# Patient Record
Sex: Male | Born: 1950 | Race: White | Hispanic: No | Marital: Married | Smoking: Current every day smoker
Health system: Southern US, Community
[De-identification: ages and names within clinical notes are randomized; demographics above are authoritative.]

## PROBLEM LIST (undated history)

## (undated) DIAGNOSIS — F32A Depression, unspecified: Secondary | ICD-10-CM

## (undated) DIAGNOSIS — E785 Hyperlipidemia, unspecified: Secondary | ICD-10-CM

## (undated) DIAGNOSIS — M5412 Radiculopathy, cervical region: Secondary | ICD-10-CM

## (undated) DIAGNOSIS — F329 Major depressive disorder, single episode, unspecified: Secondary | ICD-10-CM

## (undated) DIAGNOSIS — M545 Low back pain, unspecified: Secondary | ICD-10-CM

## (undated) DIAGNOSIS — I10 Essential (primary) hypertension: Secondary | ICD-10-CM

## (undated) DIAGNOSIS — E119 Type 2 diabetes mellitus without complications: Secondary | ICD-10-CM

## (undated) DIAGNOSIS — M1990 Unspecified osteoarthritis, unspecified site: Secondary | ICD-10-CM

## (undated) DIAGNOSIS — E538 Deficiency of other specified B group vitamins: Secondary | ICD-10-CM

## (undated) DIAGNOSIS — K219 Gastro-esophageal reflux disease without esophagitis: Secondary | ICD-10-CM

## (undated) HISTORY — PX: OTHER SURGICAL HISTORY: SHX169

## (undated) HISTORY — PX: BACK SURGERY: SHX140

## (undated) HISTORY — PX: FRACTURE SURGERY: SHX138

## (undated) HISTORY — PX: SHOULDER SURGERY: SHX246

---

## 1998-06-28 ENCOUNTER — Other Ambulatory Visit: Admission: RE | Admit: 1998-06-28 | Discharge: 1998-06-28 | Payer: Self-pay | Admitting: Family Medicine

## 2002-02-19 ENCOUNTER — Encounter: Admission: RE | Admit: 2002-02-19 | Discharge: 2002-02-19 | Payer: Self-pay

## 2003-03-28 ENCOUNTER — Ambulatory Visit (HOSPITAL_COMMUNITY): Admission: RE | Admit: 2003-03-28 | Discharge: 2003-03-28 | Payer: Self-pay | Admitting: Gastroenterology

## 2014-10-05 ENCOUNTER — Other Ambulatory Visit: Payer: Self-pay | Admitting: Neurosurgery

## 2014-10-14 NOTE — Pre-Procedure Instructions (Signed)
ERVING HON  10/14/2014   Your procedure is scheduled on:  Thursday, October 27, 2014 at 7:30 AM.   Report to [facility] Entrance "A" Admitting Office at 5:30 AM.   Call this number if you have problems the morning of surgery: [phone]   Remember:   Do not eat food or drink liquids after midnight Monday, 10/24/14.   Take these medicines the morning of surgery with A SIP OF WATER: amlodipine(Norvasc),fenofibrate,pain medication if needed,magnesium,protonix if needed   Do not wear jewelry.  Do not wear lotions, powders, or cologne. You may not wear deodorant.  Men may shave face and neck.  Do not bring valuables to the hospital.  [facility] is not responsible for any belongings or valuables.               Contacts, dentures or bridgework may not be worn into surgery .  Leave suitcase in the car. After surgery it may be brought to your room.  For patients admitted to the hospital, discharge time is determined by your treatment team.               Special Instructions: [place] - Preparing for Surgery  Before surgery, you can play an important role.  Because skin is not sterile, your skin needs to be as free of germs as possible.  You can reduce the number of germs on you skin by washing with CHG (chlorahexidine gluconate) soap before surgery.  CHG is an antiseptic cleaner which kills germs and bonds with the skin to continue killing germs even after washing.  Please DO NOT use if you have an allergy to CHG or antibacterial soaps.  If your skin becomes reddened/irritated stop using the CHG and inform your nurse when you arrive at Short Stay.  Do not shave (including legs and underarms) for at least 48 hours prior to the first CHG shower.  You may shave your face.  Please follow these instructions carefully:   1.  Shower with CHG Soap the night before surgery and the   morning of Surgery.  2.  If you choose to wash your hair, wash your hair first as usual with your   normal shampoo.  3.  After you shampoo, rinse your hair and body thoroughly to remove the   Shampoo.  4.  Use CHG as you would any other liquid soap.  You can apply chg directly  to the skin and wash gently with scrungie or a clean washcloth.  5.  Apply the CHG Soap to your body ONLY FROM THE NECK DOWN.        Do not use on open wounds or open sores.  Avoid contact with your eyes, ears, mouth and genitals (private parts).  Wash genitals (private parts) with your normal soap.  6.  Wash thoroughly, paying special attention to the area where your surgery  will be performed.  7.  Thoroughly rinse your body with warm water from the neck down.  8.  DO NOT shower/wash with your normal soap after using and rinsing off  the CHG Soap.  9.  Pat yourself dry with a clean towel.            10.  Wear clean pajamas.            11.  Place clean sheets on your bed the night of your first shower and do not  sleep with pets.  Day of Surgery  Do not apply any lotions  the morning of surgery.  Please wear clean clothes to the hospital.     Please read over the following fact sheets that you were given: Pain Booklet, Coughing and Deep Breathing, Blood Transfusion Information, MRSA Information and Surgical Site Infection Prevention

## 2014-10-17 ENCOUNTER — Encounter (HOSPITAL_COMMUNITY)
Admission: RE | Admit: 2014-10-17 | Discharge: 2014-10-17 | Disposition: A | Discharge status: Home / Self Care | Payer: Medicare Other | Source: Ambulatory Visit | Attending: Anesthesiology | Admitting: Anesthesiology

## 2014-10-17 ENCOUNTER — Encounter (HOSPITAL_COMMUNITY): Payer: Self-pay

## 2014-10-17 ENCOUNTER — Encounter (HOSPITAL_COMMUNITY)
Admission: RE | Admit: 2014-10-17 | Discharge: 2014-10-17 | Disposition: A | Discharge status: Home / Self Care | Payer: Medicare Other | Source: Ambulatory Visit | Attending: Neurosurgery | Admitting: Neurosurgery

## 2014-10-17 DIAGNOSIS — Z01818 Encounter for other preprocedural examination: Secondary | ICD-10-CM | POA: Insufficient documentation

## 2014-10-17 DIAGNOSIS — E785 Hyperlipidemia, unspecified: Secondary | ICD-10-CM | POA: Diagnosis not present

## 2014-10-17 DIAGNOSIS — I10 Essential (primary) hypertension: Secondary | ICD-10-CM | POA: Diagnosis not present

## 2014-10-17 DIAGNOSIS — M4806 Spinal stenosis, lumbar region: Secondary | ICD-10-CM | POA: Insufficient documentation

## 2014-10-17 DIAGNOSIS — E119 Type 2 diabetes mellitus without complications: Secondary | ICD-10-CM | POA: Insufficient documentation

## 2014-10-17 DIAGNOSIS — F329 Major depressive disorder, single episode, unspecified: Secondary | ICD-10-CM | POA: Insufficient documentation

## 2014-10-17 DIAGNOSIS — E538 Deficiency of other specified B group vitamins: Secondary | ICD-10-CM | POA: Diagnosis not present

## 2014-10-17 DIAGNOSIS — K219 Gastro-esophageal reflux disease without esophagitis: Secondary | ICD-10-CM | POA: Insufficient documentation

## 2014-10-17 DIAGNOSIS — Z01811 Encounter for preprocedural respiratory examination: Secondary | ICD-10-CM

## 2014-10-17 HISTORY — DX: Major depressive disorder, single episode, unspecified: F32.9

## 2014-10-17 HISTORY — DX: Depression, unspecified: F32.A

## 2014-10-17 HISTORY — DX: Unspecified osteoarthritis, unspecified site: M19.90

## 2014-10-17 HISTORY — DX: Low back pain: M54.5

## 2014-10-17 HISTORY — DX: Deficiency of other specified B group vitamins: E53.8

## 2014-10-17 HISTORY — DX: Low back pain, unspecified: M54.50

## 2014-10-17 HISTORY — DX: Hyperlipidemia, unspecified: E78.5

## 2014-10-17 HISTORY — DX: Type 2 diabetes mellitus without complications: E11.9

## 2014-10-17 HISTORY — DX: Hypomagnesemia: E83.42

## 2014-10-17 HISTORY — DX: Essential (primary) hypertension: I10

## 2014-10-17 HISTORY — DX: Gastro-esophageal reflux disease without esophagitis: K21.9

## 2014-10-17 HISTORY — DX: Radiculopathy, cervical region: M54.12

## 2014-10-17 LAB — BASIC METABOLIC PANEL
Anion gap: 14 (ref 5–15)
BUN: 14 mg/dL (ref 6–23)
CALCIUM: 10.5 mg/dL (ref 8.4–10.5)
CO2: 25 meq/L (ref 19–32)
CREATININE: 1.12 mg/dL (ref 0.50–1.35)
Chloride: 100 mEq/L (ref 96–112)
GFR calc Af Amer: 79 mL/min — ABNORMAL LOW (ref >90)
GFR, EST NON AFRICAN AMERICAN: 68 mL/min — AB (ref >90)
GLUCOSE: 107 mg/dL — AB (ref 70–99)
Potassium: 4.5 mEq/L (ref 3.7–5.3)
Sodium: 139 mEq/L (ref 137–147)

## 2014-10-17 LAB — SURGICAL PCR SCREEN
MRSA, PCR: NEGATIVE
Staphylococcus aureus: NEGATIVE

## 2014-10-17 LAB — CBC
HEMATOCRIT: 39 % (ref 39.0–52.0)
Hemoglobin: 12.1 g/dL — ABNORMAL LOW (ref 13.0–17.0)
MCH: 26.6 pg (ref 26.0–34.0)
MCHC: 31 g/dL (ref 30.0–36.0)
MCV: 85.7 fL (ref 78.0–100.0)
PLATELETS: 269 10*3/uL (ref 150–400)
RBC: 4.55 MIL/uL (ref 4.22–5.81)
RDW: 16.6 % — AB (ref 11.5–15.5)
WBC: 6.1 10*3/uL (ref 4.0–10.5)

## 2014-10-17 LAB — TYPE AND SCREEN
ABO/RH(D): O NEG
Antibody Screen: NEGATIVE

## 2014-10-17 LAB — ABO/RH: ABO/RH(D): O NEG

## 2014-10-17 NOTE — Progress Notes (Signed)
This patient tested at an elevated risk for OSA during a pre-surgical visit using the SROP BANG TOOL.A score of 4 or greater is an elevated risk.

## 2014-10-17 NOTE — Progress Notes (Signed)
Anesthesia Chart Review:  Pt is 63 year old male scheduled for extension of fusion L3-4, L4-5, removal of old hardware on 10/27/14 with Dr. Hal Neer.   PMH: HTN, DM, hyperlipidemia, B12 deficiency, GERD, depression  Medications include: ASA, amlodipine, hctz, lisinopril, glimepiride, pioglitazone-metformin, lipitor  Preoperative labs reviewed.    Chest x-ray reviewed. 1. Borderline cardiomegaly without failure.  2. No acute cardiopulmonary disease.   EKG:  NSR with sinus arrhythmia. Cannot rule out anterior infarct, age undetermined. Contacted PCP office, no previous EKG available.   Reviewed with Dr. Therisa Doyne.   If no changes, I anticipate pt can proceed with surgery as scheduled.   Willeen Cass, FNP-BC [facility] Short Stay Surgical [facility]/Anesthesiology Phone: [phone] 10/17/2014 3:47 PM

## 2014-10-26 MED ORDER — CEFAZOLIN SODIUM-DEXTROSE 2-3 GM-% IV SOLR
2.0000 g | INTRAVENOUS | Status: AC
  Administered 2014-10-27 (×2): 2 g via INTRAVENOUS
  Filled 2014-10-26: qty 50

## 2014-10-26 MED ORDER — DEXAMETHASONE SODIUM PHOSPHATE 10 MG/ML IJ SOLN
10.0000 mg | INTRAMUSCULAR | Status: AC
  Administered 2014-10-27: 10 mg via INTRAVENOUS
  Filled 2014-10-26: qty 1

## 2014-10-27 ENCOUNTER — Encounter (HOSPITAL_COMMUNITY)
Admission: RE | Disposition: A | Discharge status: Home / Self Care | Payer: Self-pay | Source: Ambulatory Visit | Attending: Neurosurgery

## 2014-10-27 ENCOUNTER — Encounter (HOSPITAL_COMMUNITY): Payer: Self-pay | Admitting: *Deleted

## 2014-10-27 ENCOUNTER — Inpatient Hospital Stay (HOSPITAL_COMMUNITY)
Admission: RE | Admit: 2014-10-27 | Discharge: 2014-10-29 | DRG: 460 | Disposition: A | Discharge status: Home / Self Care | Payer: Medicare Other | Source: Ambulatory Visit | Attending: Neurosurgery | Admitting: Neurosurgery

## 2014-10-27 ENCOUNTER — Encounter (HOSPITAL_COMMUNITY): Payer: Medicare Other | Admitting: Emergency Medicine

## 2014-10-27 ENCOUNTER — Inpatient Hospital Stay (HOSPITAL_COMMUNITY): Payer: Medicare Other | Admitting: Certified Registered Nurse Anesthetist

## 2014-10-27 ENCOUNTER — Inpatient Hospital Stay (HOSPITAL_COMMUNITY): Payer: Medicare Other

## 2014-10-27 DIAGNOSIS — K219 Gastro-esophageal reflux disease without esophagitis: Secondary | ICD-10-CM | POA: Diagnosis present

## 2014-10-27 DIAGNOSIS — Z6837 Body mass index (BMI) 37.0-37.9, adult: Secondary | ICD-10-CM | POA: Diagnosis not present

## 2014-10-27 DIAGNOSIS — F329 Major depressive disorder, single episode, unspecified: Secondary | ICD-10-CM | POA: Diagnosis present

## 2014-10-27 DIAGNOSIS — M4326 Fusion of spine, lumbar region: Secondary | ICD-10-CM

## 2014-10-27 DIAGNOSIS — I10 Essential (primary) hypertension: Secondary | ICD-10-CM | POA: Diagnosis present

## 2014-10-27 DIAGNOSIS — E669 Obesity, unspecified: Secondary | ICD-10-CM | POA: Diagnosis present

## 2014-10-27 DIAGNOSIS — M7130 Other bursal cyst, unspecified site: Secondary | ICD-10-CM | POA: Diagnosis present

## 2014-10-27 DIAGNOSIS — E119 Type 2 diabetes mellitus without complications: Secondary | ICD-10-CM | POA: Diagnosis present

## 2014-10-27 DIAGNOSIS — F17210 Nicotine dependence, cigarettes, uncomplicated: Secondary | ICD-10-CM | POA: Diagnosis present

## 2014-10-27 DIAGNOSIS — M4806 Spinal stenosis, lumbar region: Principal | ICD-10-CM | POA: Diagnosis present

## 2014-10-27 DIAGNOSIS — M549 Dorsalgia, unspecified: Secondary | ICD-10-CM | POA: Diagnosis present

## 2014-10-27 DIAGNOSIS — M48061 Spinal stenosis, lumbar region without neurogenic claudication: Secondary | ICD-10-CM | POA: Diagnosis present

## 2014-10-27 LAB — GLUCOSE, CAPILLARY
GLUCOSE-CAPILLARY: 254 mg/dL — AB (ref 70–99)
Glucose-Capillary: 104 mg/dL — ABNORMAL HIGH (ref 70–99)

## 2014-10-27 SURGERY — POSTERIOR LUMBAR FUSION 2 LEVEL
Anesthesia: General | Site: Back

## 2014-10-27 MED ORDER — GLYCOPYRROLATE 0.2 MG/ML IJ SOLN
INTRAMUSCULAR | Status: DC | PRN
  Administered 2014-10-27: .8 mg via INTRAVENOUS

## 2014-10-27 MED ORDER — LACTATED RINGERS IV SOLN
INTRAVENOUS | Status: DC | PRN
  Administered 2014-10-27 (×3): via INTRAVENOUS

## 2014-10-27 MED ORDER — CYCLOBENZAPRINE HCL 10 MG PO TABS
10.0000 mg | ORAL_TABLET | Freq: Three times a day (TID) | ORAL | Status: DC | PRN
  Administered 2014-10-27 – 2014-10-28 (×4): 10 mg via ORAL
  Filled 2014-10-27 (×3): qty 1

## 2014-10-27 MED ORDER — CYCLOBENZAPRINE HCL 10 MG PO TABS
ORAL_TABLET | ORAL | Status: AC
  Filled 2014-10-27: qty 1

## 2014-10-27 MED ORDER — ARTIFICIAL TEARS OP OINT
TOPICAL_OINTMENT | OPHTHALMIC | Status: DC | PRN
  Administered 2014-10-27: 1 via OPHTHALMIC

## 2014-10-27 MED ORDER — INSULIN ASPART 100 UNIT/ML ~~LOC~~ SOLN
0.0000 [IU] | Freq: Every day | SUBCUTANEOUS | Status: DC
  Administered 2014-10-27: 3 [IU] via SUBCUTANEOUS

## 2014-10-27 MED ORDER — OXYCODONE HCL 5 MG PO TABS
ORAL_TABLET | ORAL | Status: AC
  Filled 2014-10-27: qty 1

## 2014-10-27 MED ORDER — ALBUMIN HUMAN 5 % IV SOLN
INTRAVENOUS | Status: DC | PRN
  Administered 2014-10-27 (×4): via INTRAVENOUS

## 2014-10-27 MED ORDER — NEOSTIGMINE METHYLSULFATE 10 MG/10ML IV SOLN
INTRAVENOUS | Status: AC
  Filled 2014-10-27: qty 1

## 2014-10-27 MED ORDER — ROCURONIUM BROMIDE 50 MG/5ML IV SOLN
INTRAVENOUS | Status: AC
  Filled 2014-10-27: qty 1

## 2014-10-27 MED ORDER — GLIMEPIRIDE 4 MG PO TABS
4.0000 mg | ORAL_TABLET | Freq: Every day | ORAL | Status: DC
  Administered 2014-10-28 – 2014-10-29 (×2): 4 mg via ORAL
  Filled 2014-10-27 (×2): qty 1

## 2014-10-27 MED ORDER — FENTANYL CITRATE 0.05 MG/ML IJ SOLN
25.0000 ug | INTRAMUSCULAR | Status: DC | PRN
  Administered 2014-10-27: 50 ug via INTRAVENOUS
  Administered 2014-10-27 (×2): 25 ug via INTRAVENOUS
  Administered 2014-10-27: 50 ug via INTRAVENOUS

## 2014-10-27 MED ORDER — ONDANSETRON HCL 4 MG/2ML IJ SOLN
INTRAMUSCULAR | Status: DC | PRN
  Administered 2014-10-27: 4 mg via INTRAVENOUS

## 2014-10-27 MED ORDER — PIOGLITAZONE HCL 15 MG PO TABS
15.0000 mg | ORAL_TABLET | Freq: Two times a day (BID) | ORAL | Status: DC
  Administered 2014-10-28 – 2014-10-29 (×2): 15 mg via ORAL
  Filled 2014-10-27 (×5): qty 1

## 2014-10-27 MED ORDER — PROPOFOL 10 MG/ML IV BOLUS
INTRAVENOUS | Status: DC | PRN
  Administered 2014-10-27: 170 mg via INTRAVENOUS

## 2014-10-27 MED ORDER — BUPIVACAINE LIPOSOME 1.3 % IJ SUSP
INTRAMUSCULAR | Status: DC | PRN
  Administered 2014-10-27: 20 mL

## 2014-10-27 MED ORDER — PIOGLITAZONE HCL-METFORMIN HCL 15-850 MG PO TABS: 1.0000 | ORAL_TABLET | Freq: Two times a day (BID) | ORAL | Status: DC

## 2014-10-27 MED ORDER — INSULIN ASPART 100 UNIT/ML ~~LOC~~ SOLN
6.0000 [IU] | Freq: Three times a day (TID) | SUBCUTANEOUS | Status: DC
  Administered 2014-10-27 – 2014-10-29 (×5): 6 [IU] via SUBCUTANEOUS

## 2014-10-27 MED ORDER — VECURONIUM BROMIDE 10 MG IV SOLR
INTRAVENOUS | Status: AC
  Filled 2014-10-27: qty 10

## 2014-10-27 MED ORDER — CEFAZOLIN SODIUM-DEXTROSE 2-3 GM-% IV SOLR
2.0000 g | Freq: Three times a day (TID) | INTRAVENOUS | Status: AC
  Administered 2014-10-27 – 2014-10-28 (×2): 2 g via INTRAVENOUS
  Filled 2014-10-27 (×2): qty 50

## 2014-10-27 MED ORDER — GLYCOPYRROLATE 0.2 MG/ML IJ SOLN
INTRAMUSCULAR | Status: AC
  Filled 2014-10-27: qty 4

## 2014-10-27 MED ORDER — NEOSTIGMINE METHYLSULFATE 10 MG/10ML IV SOLN
INTRAVENOUS | Status: DC | PRN
  Administered 2014-10-27: 5 mg via INTRAVENOUS

## 2014-10-27 MED ORDER — ONDANSETRON HCL 4 MG/2ML IJ SOLN: 4.0000 mg | Freq: Once | INTRAMUSCULAR | Status: DC | PRN

## 2014-10-27 MED ORDER — MIDAZOLAM HCL 5 MG/5ML IJ SOLN
INTRAMUSCULAR | Status: DC | PRN
  Administered 2014-10-27: 2 mg via INTRAVENOUS

## 2014-10-27 MED ORDER — FENTANYL CITRATE 0.05 MG/ML IJ SOLN
INTRAMUSCULAR | Status: DC | PRN
  Administered 2014-10-27: 50 ug via INTRAVENOUS
  Administered 2014-10-27: 100 ug via INTRAVENOUS
  Administered 2014-10-27 (×2): 50 ug via INTRAVENOUS
  Administered 2014-10-27: 100 ug via INTRAVENOUS
  Administered 2014-10-27 (×3): 50 ug via INTRAVENOUS

## 2014-10-27 MED ORDER — LIDOCAINE HCL (CARDIAC) 20 MG/ML IV SOLN
INTRAVENOUS | Status: AC
  Filled 2014-10-27: qty 5

## 2014-10-27 MED ORDER — OXYCODONE-ACETAMINOPHEN 5-325 MG PO TABS
1.0000 | ORAL_TABLET | ORAL | Status: DC | PRN
  Administered 2014-10-27 – 2014-10-28 (×2): 2 via ORAL
  Filled 2014-10-27 (×3): qty 2

## 2014-10-27 MED ORDER — FENTANYL CITRATE 0.05 MG/ML IJ SOLN
INTRAMUSCULAR | Status: AC
  Filled 2014-10-27: qty 5

## 2014-10-27 MED ORDER — ALUM & MAG HYDROXIDE-SIMETH 200-200-20 MG/5ML PO SUSP: 30.0000 mL | Freq: Four times a day (QID) | ORAL | Status: DC | PRN

## 2014-10-27 MED ORDER — MENTHOL 3 MG MT LOZG: 1.0000 | LOZENGE | OROMUCOSAL | Status: DC | PRN

## 2014-10-27 MED ORDER — BACITRACIN 50000 UNITS IM SOLR
INTRAMUSCULAR | Status: DC | PRN
  Administered 2014-10-27 (×2)

## 2014-10-27 MED ORDER — PHENYLEPHRINE HCL 10 MG/ML IJ SOLN
10.0000 mg | INTRAVENOUS | Status: DC | PRN
  Administered 2014-10-27: 20 ug/min via INTRAVENOUS

## 2014-10-27 MED ORDER — BUPIVACAINE LIPOSOME 1.3 % IJ SUSP
20.0000 mL | INTRAMUSCULAR | Status: AC
  Filled 2014-10-27: qty 20

## 2014-10-27 MED ORDER — HYDROCHLOROTHIAZIDE 25 MG PO TABS
12.5000 mg | ORAL_TABLET | Freq: Every day | ORAL | Status: DC
  Administered 2014-10-28 – 2014-10-29 (×2): 12.5 mg via ORAL
  Filled 2014-10-27 (×2): qty 1

## 2014-10-27 MED ORDER — SODIUM CHLORIDE 0.9 % IJ SOLN: 3.0000 mL | INTRAMUSCULAR | Status: DC | PRN

## 2014-10-27 MED ORDER — ACETAMINOPHEN 650 MG RE SUPP: 650.0000 mg | RECTAL | Status: DC | PRN

## 2014-10-27 MED ORDER — PHENYLEPHRINE HCL 10 MG/ML IJ SOLN
INTRAMUSCULAR | Status: DC | PRN
  Administered 2014-10-27 (×5): 80 ug via INTRAVENOUS

## 2014-10-27 MED ORDER — LISINOPRIL 20 MG PO TABS
40.0000 mg | ORAL_TABLET | Freq: Every day | ORAL | Status: DC
  Administered 2014-10-28 – 2014-10-29 (×2): 40 mg via ORAL
  Filled 2014-10-27 (×2): qty 2

## 2014-10-27 MED ORDER — ONDANSETRON HCL 4 MG/2ML IJ SOLN
INTRAMUSCULAR | Status: AC
  Filled 2014-10-27: qty 2

## 2014-10-27 MED ORDER — ALBUTEROL SULFATE HFA 108 (90 BASE) MCG/ACT IN AERS
INHALATION_SPRAY | RESPIRATORY_TRACT | Status: DC | PRN
  Administered 2014-10-27: 4 via RESPIRATORY_TRACT

## 2014-10-27 MED ORDER — NEOSTIGMINE METHYLSULFATE 10 MG/10ML IV SOLN
INTRAVENOUS | Status: AC
  Filled 2014-10-27: qty 2

## 2014-10-27 MED ORDER — METFORMIN HCL 850 MG PO TABS
850.0000 mg | ORAL_TABLET | Freq: Two times a day (BID) | ORAL | Status: DC
  Administered 2014-10-28 – 2014-10-29 (×2): 850 mg via ORAL
  Filled 2014-10-27 (×5): qty 1

## 2014-10-27 MED ORDER — AMLODIPINE BESYLATE 5 MG PO TABS
5.0000 mg | ORAL_TABLET | Freq: Every day | ORAL | Status: DC
  Administered 2014-10-28 – 2014-10-29 (×2): 5 mg via ORAL
  Filled 2014-10-27 (×2): qty 1

## 2014-10-27 MED ORDER — ACETAMINOPHEN 325 MG PO TABS: 650.0000 mg | ORAL_TABLET | ORAL | Status: DC | PRN

## 2014-10-27 MED ORDER — ATORVASTATIN CALCIUM 10 MG PO TABS
20.0000 mg | ORAL_TABLET | Freq: Every day | ORAL | Status: DC
  Administered 2014-10-28: 20 mg via ORAL
  Filled 2014-10-27: qty 2

## 2014-10-27 MED ORDER — ARTIFICIAL TEARS OP OINT
TOPICAL_OINTMENT | OPHTHALMIC | Status: AC
  Filled 2014-10-27: qty 3.5

## 2014-10-27 MED ORDER — STERILE WATER FOR INJECTION IJ SOLN
INTRAMUSCULAR | Status: AC
  Filled 2014-10-27: qty 10

## 2014-10-27 MED ORDER — VANCOMYCIN HCL 1000 MG IV SOLR
INTRAVENOUS | Status: AC
  Filled 2014-10-27: qty 1000

## 2014-10-27 MED ORDER — PHENOL 1.4 % MT LIQD
1.0000 | OROMUCOSAL | Status: DC | PRN
  Administered 2014-10-27: 1 via OROMUCOSAL
  Filled 2014-10-27: qty 177

## 2014-10-27 MED ORDER — OXYCODONE HCL 5 MG PO TABS
5.0000 mg | ORAL_TABLET | Freq: Once | ORAL | Status: AC | PRN
  Administered 2014-10-27: 5 mg via ORAL

## 2014-10-27 MED ORDER — ALBUTEROL SULFATE HFA 108 (90 BASE) MCG/ACT IN AERS
INHALATION_SPRAY | RESPIRATORY_TRACT | Status: AC
  Filled 2014-10-27: qty 6.7

## 2014-10-27 MED ORDER — LIDOCAINE HCL (CARDIAC) 20 MG/ML IV SOLN
INTRAVENOUS | Status: DC | PRN
  Administered 2014-10-27: 50 mg via INTRAVENOUS

## 2014-10-27 MED ORDER — SODIUM CHLORIDE 0.9 % IR SOLN
Status: DC | PRN
  Administered 2014-10-27: 1000 mL

## 2014-10-27 MED ORDER — ONDANSETRON HCL 4 MG/2ML IJ SOLN: 4.0000 mg | INTRAMUSCULAR | Status: DC | PRN

## 2014-10-27 MED ORDER — INSULIN ASPART 100 UNIT/ML ~~LOC~~ SOLN
0.0000 [IU] | Freq: Three times a day (TID) | SUBCUTANEOUS | Status: DC
  Administered 2014-10-27: 4 [IU] via SUBCUTANEOUS
  Administered 2014-10-28: 3 [IU] via SUBCUTANEOUS
  Administered 2014-10-28: 4 [IU] via SUBCUTANEOUS
  Administered 2014-10-29: 3 [IU] via SUBCUTANEOUS

## 2014-10-27 MED ORDER — FENTANYL CITRATE 0.05 MG/ML IJ SOLN
INTRAMUSCULAR | Status: AC
  Filled 2014-10-27: qty 2

## 2014-10-27 MED ORDER — CEFAZOLIN SODIUM-DEXTROSE 2-3 GM-% IV SOLR
INTRAVENOUS | Status: AC
  Filled 2014-10-27: qty 50

## 2014-10-27 MED ORDER — SODIUM CHLORIDE 0.9 % IV SOLN: 250.0000 mL | INTRAVENOUS | Status: DC

## 2014-10-27 MED ORDER — VECURONIUM BROMIDE 10 MG IV SOLR
INTRAVENOUS | Status: DC | PRN
  Administered 2014-10-27 (×2): 1 mg via INTRAVENOUS
  Administered 2014-10-27: .5 mg via INTRAVENOUS
  Administered 2014-10-27: 1 mg via INTRAVENOUS
  Administered 2014-10-27: 2 mg via INTRAVENOUS
  Administered 2014-10-27: .5 mg via INTRAVENOUS
  Administered 2014-10-27 (×2): 1 mg via INTRAVENOUS
  Administered 2014-10-27: 2 mg via INTRAVENOUS
  Administered 2014-10-27 (×2): 1 mg via INTRAVENOUS

## 2014-10-27 MED ORDER — EPHEDRINE SULFATE 50 MG/ML IJ SOLN
INTRAMUSCULAR | Status: AC
  Filled 2014-10-27: qty 1

## 2014-10-27 MED ORDER — SUCCINYLCHOLINE CHLORIDE 20 MG/ML IJ SOLN
INTRAMUSCULAR | Status: AC
  Filled 2014-10-27: qty 1

## 2014-10-27 MED ORDER — HYDROMORPHONE HCL 1 MG/ML IJ SOLN
1.0000 mg | INTRAMUSCULAR | Status: DC | PRN
Start: 2014-10-27 — End: 2014-10-28
  Administered 2014-10-27 (×2): 1 mg via INTRAMUSCULAR
  Administered 2014-10-28: 1.5 mg via INTRAMUSCULAR
  Filled 2014-10-27 (×2): qty 1
  Filled 2014-10-27: qty 2

## 2014-10-27 MED ORDER — MIDAZOLAM HCL 2 MG/2ML IJ SOLN
INTRAMUSCULAR | Status: AC
  Filled 2014-10-27: qty 2

## 2014-10-27 MED ORDER — THROMBIN 20000 UNITS EX SOLR
CUTANEOUS | Status: DC | PRN
  Administered 2014-10-27 (×3): via TOPICAL

## 2014-10-27 MED ORDER — ROCURONIUM BROMIDE 100 MG/10ML IV SOLN
INTRAVENOUS | Status: DC | PRN
  Administered 2014-10-27: 50 mg via INTRAVENOUS

## 2014-10-27 MED ORDER — SODIUM CHLORIDE 0.9 % IJ SOLN
3.0000 mL | Freq: Two times a day (BID) | INTRAMUSCULAR | Status: DC
  Administered 2014-10-27 – 2014-10-29 (×3): 3 mL via INTRAVENOUS

## 2014-10-27 MED ORDER — POTASSIUM CHLORIDE IN NACL 20-0.45 MEQ/L-% IV SOLN
INTRAVENOUS | Status: DC
  Administered 2014-10-27: 17:00:00 via INTRAVENOUS
  Filled 2014-10-27 (×5): qty 1000

## 2014-10-27 MED ORDER — PANTOPRAZOLE SODIUM 40 MG IV SOLR
40.0000 mg | Freq: Every day | INTRAVENOUS | Status: DC
  Administered 2014-10-27: 40 mg via INTRAVENOUS
  Filled 2014-10-27: qty 40

## 2014-10-27 MED ORDER — PHENYLEPHRINE 40 MCG/ML (10ML) SYRINGE FOR IV PUSH (FOR BLOOD PRESSURE SUPPORT)
PREFILLED_SYRINGE | INTRAVENOUS | Status: AC
  Filled 2014-10-27: qty 10

## 2014-10-27 MED ORDER — PROPOFOL 10 MG/ML IV BOLUS
INTRAVENOUS | Status: AC
  Filled 2014-10-27: qty 20

## 2014-10-27 MED ORDER — OXYCODONE HCL 5 MG/5ML PO SOLN: 5.0000 mg | Freq: Once | ORAL | Status: AC | PRN

## 2014-10-27 MED ORDER — VANCOMYCIN HCL 1000 MG IV SOLR
INTRAVENOUS | Status: DC | PRN
  Administered 2014-10-27: 1000 mg

## 2014-10-27 SURGICAL SUPPLY — 69 items
APL SKNCLS STERI-STRIP NONHPOA (GAUZE/BANDAGES/DRESSINGS) ×2
BAG DECANTER FOR FLEXI CONT (MISCELLANEOUS) ×3 IMPLANT
BENZOIN TINCTURE PRP APPL 2/3 (GAUZE/BANDAGES/DRESSINGS) ×6 IMPLANT
BLADE CLIPPER SURG (BLADE) ×3 IMPLANT
BONE EQUIVA 10CC (Bone Implant) ×4 IMPLANT
BRUSH SCRUB EZ PLAIN DRY (MISCELLANEOUS) ×3 IMPLANT
BUR CUTTER 7.0 ROUND (BURR) ×7 IMPLANT
BUR MATCHSTICK NEURO 3.0 LAGG (BURR) ×3 IMPLANT
CAGE PEEK OPTIMA ARDIS 11X9X26 (Cage) ×4 IMPLANT
CANISTER SUCT 3000ML (MISCELLANEOUS) ×3 IMPLANT
CLOSURE WOUND 1/2 X4 (GAUZE/BANDAGES/DRESSINGS) ×2
CONT SPEC 4OZ CLIKSEAL STRL BL (MISCELLANEOUS) ×6 IMPLANT
COVER BACK TABLE 60X90IN (DRAPES) ×3 IMPLANT
DRAPE C-ARM 42X72 X-RAY (DRAPES) ×6 IMPLANT
DRAPE LAPAROTOMY 100X72X124 (DRAPES) ×3 IMPLANT
DRAPE SURG 17X23 STRL (DRAPES) ×6 IMPLANT
DRSG AQUACEL AG ADV 3.5X10 (GAUZE/BANDAGES/DRESSINGS) ×2 IMPLANT
DRSG OPSITE 4X5.5 SM (GAUZE/BANDAGES/DRESSINGS) ×2 IMPLANT
DRSG OPSITE POSTOP 4X6 (GAUZE/BANDAGES/DRESSINGS) ×3 IMPLANT
DRSG TELFA 3X8 NADH (GAUZE/BANDAGES/DRESSINGS) ×3 IMPLANT
DURAPREP 26ML APPLICATOR (WOUND CARE) ×3 IMPLANT
ELECT REM PT RETURN 9FT ADLT (ELECTROSURGICAL) ×3
ELECTRODE REM PT RTRN 9FT ADLT (ELECTROSURGICAL) ×1 IMPLANT
EVACUATOR 1/8 PVC DRAIN (DRAIN) ×3 IMPLANT
GAUZE SPONGE 4X4 12PLY STRL (GAUZE/BANDAGES/DRESSINGS) ×3 IMPLANT
GAUZE SPONGE 4X4 16PLY XRAY LF (GAUZE/BANDAGES/DRESSINGS) ×2 IMPLANT
GLOVE BIO SURGEON STRL SZ8 (GLOVE) ×2 IMPLANT
GLOVE BIOGEL PI IND STRL 7.5 (GLOVE) IMPLANT
GLOVE BIOGEL PI IND STRL 8.5 (GLOVE) IMPLANT
GLOVE BIOGEL PI INDICATOR 7.5 (GLOVE) ×4
GLOVE BIOGEL PI INDICATOR 8.5 (GLOVE) ×2
GLOVE ECLIPSE 7.5 STRL STRAW (GLOVE) ×4 IMPLANT
GLOVE ECLIPSE 8.0 STRL XLNG CF (GLOVE) ×6 IMPLANT
GLOVE SS N UNI LF 7.0 STRL (GLOVE) ×6 IMPLANT
GOWN STRL REUS W/ TWL LRG LVL3 (GOWN DISPOSABLE) IMPLANT
GOWN STRL REUS W/ TWL XL LVL3 (GOWN DISPOSABLE) ×2 IMPLANT
GOWN STRL REUS W/TWL 2XL LVL3 (GOWN DISPOSABLE) IMPLANT
GOWN STRL REUS W/TWL LRG LVL3 (GOWN DISPOSABLE) ×6
GOWN STRL REUS W/TWL XL LVL3 (GOWN DISPOSABLE) ×9
KIT BASIN OR (CUSTOM PROCEDURE TRAY) ×3 IMPLANT
KIT ROOM TURNOVER OR (KITS) ×3 IMPLANT
LIQUID BAND (GAUZE/BANDAGES/DRESSINGS) IMPLANT
NEEDLE HYPO 22GX1.5 SAFETY (NEEDLE) ×3 IMPLANT
NS IRRIG 1000ML POUR BTL (IV SOLUTION) ×3 IMPLANT
PACK LAMINECTOMY NEURO (CUSTOM PROCEDURE TRAY) ×3 IMPLANT
PAD ARMBOARD 7.5X6 YLW CONV (MISCELLANEOUS) ×9 IMPLANT
PAD DRESSING TELFA 3X8 NADH (GAUZE/BANDAGES/DRESSINGS) ×1 IMPLANT
PATTIES SURGICAL .75X.75 (GAUZE/BANDAGES/DRESSINGS) ×3 IMPLANT
PATTIES SURGICAL 1X1 (DISPOSABLE) ×2 IMPLANT
PEDIGUARD CURV (INSTRUMENTS) ×2 IMPLANT
PEEK OPTIMA 12X9X26MM (Cage) ×4 IMPLANT
ROD PERC PREBENT 80MM (Rod) ×4 IMPLANT
SCREW MIN INVASIVE 6.5X45 (Screw) ×8 IMPLANT
SCREW PATHFINDER MIS 5.5X45 (Screw) ×4 IMPLANT
SPONGE LAP 4X18 X RAY DECT (DISPOSABLE) ×2 IMPLANT
SPONGE SURGIFOAM ABS GEL 100 (HEMOSTASIS) ×3 IMPLANT
STRIP CLOSURE SKIN 1/2X4 (GAUZE/BANDAGES/DRESSINGS) ×4 IMPLANT
SUT PROLENE 0 CT 1 30 (SUTURE) IMPLANT
SUT VIC AB 0 CT1 18XCR BRD8 (SUTURE) ×1 IMPLANT
SUT VIC AB 0 CT1 8-18 (SUTURE) ×3
SUT VIC AB 2-0 OS6 18 (SUTURE) ×9 IMPLANT
SUT VIC AB 3-0 CP2 18 (SUTURE) ×3 IMPLANT
SYR 20ML ECCENTRIC (SYRINGE) ×3 IMPLANT
TOP CLSR SEQUOIA (Orthopedic Implant) ×12 IMPLANT
TOWEL OR 17X24 6PK STRL BLUE (TOWEL DISPOSABLE) ×3 IMPLANT
TOWEL OR 17X26 10 PK STRL BLUE (TOWEL DISPOSABLE) ×3 IMPLANT
TRAP SPECIMEN MUCOUS 40CC (MISCELLANEOUS) IMPLANT
TRAY FOLEY CATH 14FRSI W/METER (CATHETERS) ×3 IMPLANT
WATER STERILE IRR 1000ML POUR (IV SOLUTION) ×3 IMPLANT

## 2014-10-27 NOTE — Anesthesia Procedure Notes (Signed)
Procedure Name: Intubation Date/Time: 10/27/2014 7:53 AM Performed by: Jacob Moores Pre-anesthesia Checklist: Patient identified, Emergency Drugs available, Suction available and Patient being monitored Patient Re-evaluated:Patient Re-evaluated prior to inductionOxygen Delivery Method: Circle system utilized Preoxygenation: Pre-oxygenation with 100% oxygen Intubation Type: IV induction Ventilation: Mask ventilation without difficulty and Oral airway inserted - appropriate to patient size Laryngoscope Size: Sabra Heck and 2 Grade View: Grade I Tube type: Oral Tube size: 7.5 mm Number of attempts: 1 Airway Equipment and Method: Stylet and Oral airway Placement Confirmation: ETT inserted through vocal cords under direct vision,  positive ETCO2 and breath sounds checked- equal and bilateral Secured at: 22 cm Tube secured with: Tape Dental Injury: Teeth and Oropharynx as per pre-operative assessment

## 2014-10-27 NOTE — Transfer of Care (Signed)
Immediate Anesthesia Transfer of Care Note  Patient: Erik Martin  Procedure(s) Performed: Procedure(s) with comments: Extension of Fusion  L/3-4, L/4-5 (N/A) - Extension of Fusion  L/3-4, L/4-5  Patient Location: PACU  Anesthesia Type:General  Level of Consciousness: awake, alert  and oriented  Airway & Oxygen Therapy: Patient Spontanous Breathing and Patient connected to nasal cannula oxygen  Post-op Assessment: Report given to PACU RN, Post -op Vital signs reviewed and stable and Patient moving all extremities X 4  Post vital signs: Reviewed and stable  Complications: No apparent anesthesia complications

## 2014-10-27 NOTE — Anesthesia Postprocedure Evaluation (Signed)
  Anesthesia Post-op Note  Patient: Erik Martin  Procedure(s) Performed: Procedure(s) with comments: Extension of Fusion  L/3-4, L/4-5 (N/A) - Extension of Fusion  L/3-4, L/4-5  Patient Location: PACU  Anesthesia Type:General  Level of Consciousness: awake, alert  and oriented  Airway and Oxygen Therapy: Patient Spontanous Breathing and Patient connected to nasal cannula oxygen  Post-op Pain: mild  Post-op Assessment: Post-op Vital signs reviewed, Patient's Cardiovascular Status Stable, Respiratory Function Stable, Patent Airway, No signs of Nausea or vomiting and Pain level controlled  Post-op Vital Signs: stable  Last Vitals:  Filed Vitals:   10/27/14 1612  BP: 137/76  Pulse: 82  Temp: 36.7 C  Resp: 20    Complications: No apparent anesthesia complications

## 2014-10-27 NOTE — H&P (Signed)
Erik Martin is an 63 y.o. male.   Chief Complaint: Back pain into the right leg HPI: The patient is a 63 year old gentleman who is evaluated in the office for back pain with radiation the right leg of a few years duration. There is no inciting event. It has been getting steadily worse. He had back surgery in 2005 with instrumented fusion at L5-S1 at [facility] did well at that time. This time is been seeing his medical doctor to prednisone pain medications without relief. An MRI scan had been done and was evaluated in the office. 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 Patient underwent epidural steroid shots which gave him no relief. He therefore requested surgery now comes for a two-level fusion extension at L3-4 and L4-5 with pedicle screw instrumentation. I've had a long discussion with him regarding the risks and benefits of surgical intervention. The risks discussed include but are not limited to bleeding infection weakness some as paralysis spinal fluid leakage trial instrumentation nonunion coma and death. We have discussed alternative methods of therapy on the risks and benefits of nonintervention. He's had the opportunity to ask numerous questions and appears to understand. With this information in hand as requested we proceed with surgery.  Past Medical History  Diagnosis Date  . Hypertension   . Diabetes mellitus without complication   . Depression   . GERD (gastroesophageal reflux disease)   . Arthritis   . Lumbar pain   . Cervical radiculopathy   . Hypomagnesemia   . B12 deficiency   . Hyperlipidemia     Past Surgical History  Procedure Laterality Date  . Shoulder surgery Left     ROTOROR CUFF,BONE SPUR  . Fracture surgery Left   . Elbow surgery Left     AS CHILD  . Back surgery      FUSION    History reviewed. No pertinent family history. Social History:  reports that he has been smoking  Cigarettes.  He has a 40 pack-year smoking history. He has never used smokeless tobacco. He reports that he drinks about 1.8 ounces of alcohol per week. He reports that he does not use illicit drugs.  Allergies:  Allergies  Allergen Reactions  . Niaspan [Niacin] Other (See Comments)    Flushing, burning    Medications Prior to Admission  Medication Sig Dispense Refill  . amLODipine (NORVASC) 5 MG tablet Take 5 mg by mouth daily.      Marland Kitchen aspirin EC 81 MG tablet Take 81 mg by mouth daily.      Marland Kitchen atorvastatin (LIPITOR) 20 MG tablet Take 20 mg by mouth daily.      . cetirizine (ZYRTEC) 10 MG tablet Take 10 mg by mouth daily as needed for allergies.      . Coenzyme Q10 (CO Q-10 PO) Take 200 mg by mouth daily.      . fenofibrate micronized (LOFIBRA) 134 MG capsule Take 134 mg by mouth daily before breakfast.      . glimepiride (AMARYL) 4 MG tablet Take 4 mg by mouth daily with breakfast.      . hydrochlorothiazide (HYDRODIURIL) 25 MG tablet Take 12.5 mg by mouth daily.      Marland Kitchen HYDROcodone-acetaminophen (NORCO) 10-325 MG per tablet Take 1 tablet by mouth 4 (four) times daily as needed (pain).      Marland Kitchen lisinopril (PRINIVIL,ZESTRIL) 20 MG tablet Take 40 mg by mouth daily.      Marland Kitchen  magnesium oxide (MAG-OX) 400 MG tablet Take 400 mg by mouth daily.      . pantoprazole (PROTONIX) 40 MG tablet Take 40 mg by mouth daily as needed (acid reflux).      . pioglitazone-metformin (ACTOPLUS MET) 15-850 MG per tablet Take 1 tablet by mouth 2 (two) times daily with a meal.      . vitamin B-12 (CYANOCOBALAMIN) 1000 MCG tablet Take 2,000 mcg by mouth daily.        Results for orders placed during the hospital encounter of 10/27/14 (from the past 48 hour(s))  GLUCOSE, CAPILLARY     Status: Abnormal   Collection Time    10/27/14  6:03 AM      Result Value Ref Range   Glucose-Capillary 104 (*) 70 - 99 mg/dL   Comment 1 Documented in Chart     Comment 2 Notify RN     No results found.  Positive for hearing loss  high cholesterol indigestion joint pain and diabetes  Blood pressure 120/70, pulse 75, temperature 97.9 F (36.6 C), temperature source Oral, resp. rate 20, weight 113.853 kg (251 lb), SpO2 96.00%.  The patient is awake alert and oriented. He has an absent right ankle jerk. 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 The plan is for extension of his fusion with instrumentation.  Faythe Ghee, MD 10/27/2014, 7:32 AM

## 2014-10-27 NOTE — Anesthesia Preprocedure Evaluation (Addendum)
Anesthesia Evaluation  Patient identified by MRN, date of birth, ID band Patient awake    Reviewed: Allergy & Precautions, H&P , NPO status , Patient's Chart, lab work & pertinent test results  Airway Mallampati: II  TM Distance: >3 FB Neck ROM: Full    Dental  (+) Dental Advisory Given, Edentulous Upper, Partial Lower, Poor Dentition, Missing,    Pulmonary Current Smoker,    + decreased breath sounds      Cardiovascular hypertension, Pt. on medications Rhythm:Regular Rate:Normal     Neuro/Psych PSYCHIATRIC DISORDERS Depression    GI/Hepatic GERD-  Medicated,  Endo/Other  diabetes, Type 2, Oral Hypoglycemic AgentsMorbid obesity  Renal/GU      Musculoskeletal  (+) Arthritis -,   Abdominal (+) + obese,   Peds  Hematology   Anesthesia Other Findings   Reproductive/Obstetrics                           Anesthesia Physical Anesthesia Plan  ASA: III  Anesthesia Plan: General   Post-op Pain Management:    Induction: Intravenous  Airway Management Planned: Oral ETT  Additional Equipment: Arterial line  Intra-op Plan:   Post-operative Plan: Extubation in OR  Informed Consent: I have reviewed the patients History and Physical, chart, labs and discussed the procedure including the risks, benefits and alternatives for the proposed anesthesia with the patient or authorized representative who has indicated his/her understanding and acceptance.   Dental advisory given  Plan Discussed with: CRNA, Anesthesiologist and Surgeon  Anesthesia Plan Comments: (Lumbar Spondylosis S/P lumbar fusion, approximately 10 years ago at [facility] Type 2 DM glucose 104 Hypertension Smoker Obesity  Plan GA with oral ETT  Roberts Gaudy)      Anesthesia Quick Evaluation

## 2014-10-27 NOTE — Op Note (Signed)
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 Localizing fluoroscopy was used prior to incision to identify the appropriate level. Midline incision was made over the spinous processes of L2 L3 L4 and L5 and S1. Incision was carried down the spinous processes of L4 L4. Subperiosteal dissection was then carried out bilaterally on the spinous processes lamina facet joint and the far lateral region to identify the transverse processes of L3-L4 bilaterally. The previous instrumentation at L5 and S1 bilaterally was identified. Self-retaining retractor was placed for exposure. X-ray shows approach the appropriate levels. The previous instrumentation L5 and S1 was removed. Screws at L5 were placed with 6.5 mm x 45 mm Pathfinder screws. We then performed a bilateral decompressive laminectomy at L3-4 and L4-5. Generous decompression was carried out by removing the inferior 80% of the lamina the medial three quarters of the facet joint and most of the superior lamina as well. Residual lamina of L5 left over from previous surgery was removed in a piecemeal fashion. We decompressed the central lateral recess stenosis remove the synovial cyst at L3-4 on the right. At this time the neural elements were well decompressed.  We then entered the disc spaces at L3-4 and L4-5 and thoroughly clean them out with pituitary rongeurs and curettes. Thorough disc space cleanout was carried out while the same time great care was taken to avoid injury to the nerve THIS was successfully done. We then prepared the disc for interbody fusion at both levels. We distracted up to a 12 mm size at L3-4 and 11 mm size at L4-5. We prepared cages filled with a mixture of autologous blood morselized allograft. L3-4 were used 12 x 9 x 26 motor cages at L4-5 we used 11 x 9 x 26 Milner cages. These were positioned into good position which was confirmed with fluoroscopy. Prior to placing the second cages we placed a mixture of autologous bone and morselized allograft deep within the interspace to help with the interbody fusions. We then placed pedicle screws at L3 and L4 bilaterally. We used AP lateral fluoroscopy to help with guidance of the screws. We passed the pedicle awl with ultrasound guidance then tapped with a 5 mm tap at L3 and a 6 Miller tap and L4. At L4 we placed 6.5 mm x 45 mm screws at L3 we placed 5.5 x 45 mm screws. We then decorticated the far lateral region to the posterior lateral fusion with a mixture of autologous bone and morselized allograft. We chose appropriately length rod secured them to the top of the screws and tightening and final tightening with torque and counter torque. Final fluoroscopy and AP lateral direction with excellent. We irrigated copiously with saline and antibiotic irrigation and then left an epidural drain in the epidural space and brought out through a separate stab wound incision. We then closed the wound in multiple layers of Vicryl on the muscle fascia subcutaneous and subcuticular tissues. A running  locking Prolene was placed on the skin. A sterile dressing was then applied and the patient was extubated and taken to recovery room in stable condition.

## 2014-10-28 LAB — GLUCOSE, CAPILLARY
GLUCOSE-CAPILLARY: 188 mg/dL — AB (ref 70–99)
GLUCOSE-CAPILLARY: 120 mg/dL — AB (ref 70–99)
GLUCOSE-CAPILLARY: 159 mg/dL — AB (ref 70–99)
Glucose-Capillary: 223 mg/dL — ABNORMAL HIGH (ref 70–99)
Glucose-Capillary: 132 mg/dL — ABNORMAL HIGH (ref 70–99)
Glucose-Capillary: 140 mg/dL — ABNORMAL HIGH (ref 70–99)

## 2014-10-28 LAB — POCT I-STAT 4, (NA,K, GLUC, HGB,HCT)
GLUCOSE: 189 mg/dL — AB (ref 70–99)
HCT: 34 % — ABNORMAL LOW (ref 39.0–52.0)
Hemoglobin: 11.6 g/dL — ABNORMAL LOW (ref 13.0–17.0)
POTASSIUM: 4.5 meq/L (ref 3.7–5.3)
Sodium: 138 mEq/L (ref 137–147)

## 2014-10-28 MED ORDER — PANTOPRAZOLE SODIUM 40 MG PO TBEC
40.0000 mg | DELAYED_RELEASE_TABLET | Freq: Every day | ORAL | Status: DC
  Administered 2014-10-28: 40 mg via ORAL
  Filled 2014-10-28: qty 1

## 2014-10-28 MED ORDER — HYDROMORPHONE HCL 2 MG PO TABS
4.0000 mg | ORAL_TABLET | ORAL | Status: DC | PRN
  Administered 2014-10-28 – 2014-10-29 (×6): 4 mg via ORAL
  Filled 2014-10-28 (×6): qty 2

## 2014-10-28 MED ORDER — HYDROMORPHONE HCL 1 MG/ML IJ SOLN
2.0000 mg | INTRAMUSCULAR | Status: DC | PRN
  Administered 2014-10-28 – 2014-10-29 (×4): 2 mg via INTRAMUSCULAR
  Filled 2014-10-28 (×5): qty 2

## 2014-10-28 NOTE — Progress Notes (Signed)
Inpatient Diabetes Program Recommendations  AACE/ADA: New Consensus Statement on Inpatient Glycemic Control (2013)  Target Ranges:  Prepandial:   less than 140 mg/dL      Peak postprandial:   less than 180 mg/dL (1-2 hours)      Critically ill patients:  140 - 180 mg/dL    Inpatient Diabetes Program Recommendations Oral Agents: Pt on meal coverage of 6 units tidwc in addition to Amaryl.  Please do not use the oral agent Amaryl while in the hospital. (Would be best not to use any oral agents while here as pt is covered with correction and meal coverage insulin) Thank you, Rosita Kea, RN, CNS, Diabetes Coordinator [phone])

## 2014-10-28 NOTE — Progress Notes (Signed)
Patient ID: Erik Martin, male   DOB: 1951/08/03, 63 y.o.   MRN: [id number] Afeb, vss No new neuro issues Already ambulated well. Needs stronger pain meds so will increase. Home this weekend

## 2014-10-28 NOTE — Progress Notes (Signed)
Foley d/c'ed. DTV by 1330.

## 2014-10-28 NOTE — Progress Notes (Signed)
Nutrition Brief Note  Patient identified on the Malnutrition Screening Tool (MST) Report  Wt Readings from Last 15 Encounters:  10/27/14 251 lb (113.853 kg)  10/27/14 251 lb (113.853 kg)  10/17/14 251 lb 9.6 oz (114.125 kg)    Body mass index is 37.05 kg/(m^2). Patient meets criteria for Obesity based on current BMI.   Current diet order is Carb Modified, patient is consuming approximately 100% of meals at this time. Pt reports having a good appetite and eating well. He denies any unintentional weight loss. RD encouraged general healthful po intake with lean protein, fruits, and vegetables. Labs and medications reviewed.   No nutrition interventions warranted at this time. If nutrition issues arise, please consult RD.   Pryor Ochoa RD, LDN Inpatient Clinical Dietitian Pager: [phone] After Hours Pager: [phone]

## 2014-10-28 NOTE — Progress Notes (Signed)
CARE MANAGEMENT NOTE 10/28/2014  Patient:  Erik Martin, Erik Martin   Account Number:  [id number]  Date Initiated:  10/28/2014  Documentation initiated by:  Olga Coaster  Subjective/Objective Assessment:   ADMITTED FOR SURGERY     Action/Plan:   CM FOLLOWING FOR DCP   Anticipated DC Date:  10/31/2014   Anticipated DC Plan:  [place]  CM consult          Status of service:  In process, will continue to follow Medicare Important Message given?   (If response is "NO", the following Medicare IM given date fields will be blank)  Per UR Regulation:  Reviewed for med. necessity/level of care/duration of stay Comments:  10/30/2015Mindi Slicker RN,BSN,MHA [id number]

## 2014-10-28 NOTE — Progress Notes (Signed)
Pt ambulated appx 75 feet this am x2 (once before breakfast and once after), tolerated very well, no walker, with brace.  Pt sat in chair for 20 minutes and promptly put back to bed after ambulation  Will encourage to ambulate throughout shift.

## 2014-10-29 LAB — GLUCOSE, CAPILLARY: GLUCOSE-CAPILLARY: 135 mg/dL — AB (ref 70–99)

## 2014-10-29 MED ORDER — HYDROMORPHONE HCL 4 MG PO TABS: 4.0000 mg | ORAL_TABLET | ORAL | Status: DC | PRN

## 2014-10-29 MED ORDER — CYCLOBENZAPRINE HCL 10 MG PO TABS: 10.0000 mg | ORAL_TABLET | Freq: Three times a day (TID) | ORAL | Status: DC | PRN

## 2014-10-29 MED ORDER — HYDROMORPHONE HCL 1 MG/ML IJ SOLN
1.0000 mg | Freq: Once | INTRAMUSCULAR | Status: AC
  Administered 2014-10-29: 1 mg via INTRAVENOUS
  Filled 2014-10-29: qty 1

## 2014-10-29 NOTE — Progress Notes (Signed)
1000 - ambulated pt with front wheel walker and brace on around nursing station approx 200 feet. Pt tolerated well. No acute distress noted.  Will continue to monitor.   Angeline Slim I 10/29/2014 11:36 AM

## 2014-10-29 NOTE — Progress Notes (Signed)
Patient ID: Erik Martin, male   DOB: 1951-11-19, 63 y.o.   MRN: [id number] Patient doing well mobilizing well pain well controlled on pills plan discharge home

## 2014-10-29 NOTE — Discharge Summary (Signed)
  Physician Discharge Summary  Patient ID: Erik Martin MRN: [id number] DOB/AGE: July 09, 1951 63 y.o.  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  Consults: Significant Diagnostic Studies: Treatments: L3-4 L4-5 posterior lumbar interbody fusion Discharge Exam: Blood pressure 106/69, pulse 96, temperature 100 F (37.8 C), temperature source Oral, resp. rate 18, height _0  (1.753 m), weight 113.853 kg (251 lb), SpO2 97.00%. Strength out of 5 wound clean dry and intact  Disposition: Home     Medication List         amLODipine 5 MG tablet  Commonly known as:  NORVASC  Take 5 mg by mouth daily.     aspirin EC 81 MG tablet  Take 81 mg by mouth daily.     atorvastatin 20 MG tablet  Commonly known as:  LIPITOR  Take 20 mg by mouth daily.     cetirizine 10 MG tablet  Commonly known as:  ZYRTEC  Take 10 mg by mouth daily as needed for allergies.     CO Q-10 PO  Take 200 mg by mouth daily.     cyclobenzaprine 10 MG tablet  Commonly known as:  FLEXERIL  Take 1 tablet (10 mg total) by mouth 3 (three) times daily as needed for muscle spasms.     fenofibrate micronized 134 MG capsule  Commonly known as:  LOFIBRA  Take 134 mg by mouth daily before breakfast.     glimepiride 4 MG tablet  Commonly known as:  AMARYL  Take 4 mg by mouth daily with breakfast.     hydrochlorothiazide 25 MG tablet  Commonly known as:  HYDRODIURIL  Take 12.5 mg by mouth daily.     HYDROcodone-acetaminophen 10-325 MG per tablet   Commonly known as:  NORCO  Take 1 tablet by mouth 4 (four) times daily as needed (pain).     HYDROmorphone 4 MG tablet  Commonly known as:  DILAUDID  Take 1 tablet (4 mg total) by mouth every 3 (three) hours as needed for severe pain.     lisinopril 20 MG tablet  Commonly known as:  PRINIVIL,ZESTRIL  Take 40 mg by mouth daily.     magnesium oxide 400 MG tablet  Commonly known as:  MAG-OX  Take 400 mg by mouth daily.     pantoprazole 40 MG tablet  Commonly known as:  PROTONIX  Take 40 mg by mouth daily as needed (acid reflux).     pioglitazone-metformin 15-850 MG per tablet  Commonly known as:  ACTOPLUS MET  Take 1 tablet by mouth 2 (two) times daily with a meal.     vitamin B-12 1000 MCG tablet  Commonly known as:  CYANOCOBALAMIN  Take 2,000 mcg by mouth daily.           Follow-up Information   Follow up with Faythe Ghee, MD.   Specialty:  Neurosurgery   Contact information:   [address] [place] [address] [phone]       Signed: Nazli Penn P 10/29/2014, 9:36 AM

## 2014-10-29 NOTE — Progress Notes (Signed)
1120 - discharge instructions and prescriptions given to pt. Pt verbally acknowledged understanding. Pt voiced no questions when prompted. Pt to be transported out of unit by guest services personnel per wheelchair.  belongings in hand with family members. Family member to transport pt to home by private car.    Will monitor   Erik Martin I 10/29/2014 11:35 AM

## 2014-10-29 NOTE — Discharge Instructions (Signed)
No lifting or bending or twisting no driving no riding in a car unless he is coming back and forth to see Dr. Hal Neer. Keep the incision clean dry and intact. The bandage was ceramic wrap for showers only.

## 2014-11-01 ENCOUNTER — Inpatient Hospital Stay (HOSPITAL_COMMUNITY)
Admission: EM | Admit: 2014-11-01 | Discharge: 2014-11-04 | DRG: 683 | Disposition: A | Discharge status: Home / Self Care | Payer: Medicare Other | Attending: Internal Medicine | Admitting: Internal Medicine

## 2014-11-01 ENCOUNTER — Encounter (HOSPITAL_COMMUNITY): Payer: Self-pay

## 2014-11-01 ENCOUNTER — Inpatient Hospital Stay (HOSPITAL_COMMUNITY): Payer: Medicare Other

## 2014-11-01 DIAGNOSIS — N179 Acute kidney failure, unspecified: Principal | ICD-10-CM | POA: Diagnosis present

## 2014-11-01 DIAGNOSIS — M545 Low back pain: Secondary | ICD-10-CM | POA: Diagnosis present

## 2014-11-01 DIAGNOSIS — I10 Essential (primary) hypertension: Secondary | ICD-10-CM | POA: Diagnosis present

## 2014-11-01 DIAGNOSIS — E860 Dehydration: Secondary | ICD-10-CM | POA: Diagnosis present

## 2014-11-01 DIAGNOSIS — Z7982 Long term (current) use of aspirin: Secondary | ICD-10-CM

## 2014-11-01 DIAGNOSIS — K5900 Constipation, unspecified: Secondary | ICD-10-CM | POA: Diagnosis present

## 2014-11-01 DIAGNOSIS — M5412 Radiculopathy, cervical region: Secondary | ICD-10-CM | POA: Diagnosis present

## 2014-11-01 DIAGNOSIS — E11649 Type 2 diabetes mellitus with hypoglycemia without coma: Secondary | ICD-10-CM | POA: Diagnosis present

## 2014-11-01 DIAGNOSIS — E785 Hyperlipidemia, unspecified: Secondary | ICD-10-CM | POA: Diagnosis present

## 2014-11-01 DIAGNOSIS — M1990 Unspecified osteoarthritis, unspecified site: Secondary | ICD-10-CM | POA: Diagnosis present

## 2014-11-01 DIAGNOSIS — E1165 Type 2 diabetes mellitus with hyperglycemia: Secondary | ICD-10-CM | POA: Diagnosis present

## 2014-11-01 DIAGNOSIS — F17210 Nicotine dependence, cigarettes, uncomplicated: Secondary | ICD-10-CM | POA: Diagnosis present

## 2014-11-01 DIAGNOSIS — D62 Acute posthemorrhagic anemia: Secondary | ICD-10-CM | POA: Diagnosis present

## 2014-11-01 DIAGNOSIS — IMO0002 Reserved for concepts with insufficient information to code with codable children: Secondary | ICD-10-CM | POA: Diagnosis present

## 2014-11-01 DIAGNOSIS — E119 Type 2 diabetes mellitus without complications: Secondary | ICD-10-CM | POA: Diagnosis present

## 2014-11-01 DIAGNOSIS — F329 Major depressive disorder, single episode, unspecified: Secondary | ICD-10-CM | POA: Diagnosis present

## 2014-11-01 DIAGNOSIS — Z79899 Other long term (current) drug therapy: Secondary | ICD-10-CM

## 2014-11-01 DIAGNOSIS — D500 Iron deficiency anemia secondary to blood loss (chronic): Secondary | ICD-10-CM | POA: Diagnosis present

## 2014-11-01 DIAGNOSIS — K219 Gastro-esophageal reflux disease without esophagitis: Secondary | ICD-10-CM | POA: Diagnosis present

## 2014-11-01 DIAGNOSIS — Z79891 Long term (current) use of opiate analgesic: Secondary | ICD-10-CM

## 2014-11-01 DIAGNOSIS — E538 Deficiency of other specified B group vitamins: Secondary | ICD-10-CM | POA: Diagnosis present

## 2014-11-01 DIAGNOSIS — R509 Fever, unspecified: Secondary | ICD-10-CM

## 2014-11-01 DIAGNOSIS — E162 Hypoglycemia, unspecified: Secondary | ICD-10-CM | POA: Diagnosis present

## 2014-11-01 DIAGNOSIS — D649 Anemia, unspecified: Secondary | ICD-10-CM | POA: Diagnosis present

## 2014-11-01 LAB — TYPE AND SCREEN
ABO/RH(D): O NEG
Antibody Screen: NEGATIVE

## 2014-11-01 LAB — CBC
HEMATOCRIT: 25.2 % — AB (ref 39.0–52.0)
Hemoglobin: 7.8 g/dL — ABNORMAL LOW (ref 13.0–17.0)
MCH: 26.4 pg (ref 26.0–34.0)
MCHC: 31 g/dL (ref 30.0–36.0)
MCV: 85.1 fL (ref 78.0–100.0)
Platelets: 223 10*3/uL (ref 150–400)
RBC: 2.96 MIL/uL — ABNORMAL LOW (ref 4.22–5.81)
RDW: 16.1 % — AB (ref 11.5–15.5)
WBC: 7.9 10*3/uL (ref 4.0–10.5)

## 2014-11-01 LAB — CBC WITH DIFFERENTIAL/PLATELET
BASOS ABS: 0 10*3/uL (ref 0.0–0.1)
BASOS PCT: 0 % (ref 0–1)
Eosinophils Absolute: 0.1 10*3/uL (ref 0.0–0.7)
Eosinophils Relative: 1 % (ref 0–5)
HCT: 26.7 % — ABNORMAL LOW (ref 39.0–52.0)
Hemoglobin: 8.2 g/dL — ABNORMAL LOW (ref 13.0–17.0)
LYMPHS ABS: 1.1 10*3/uL (ref 0.7–4.0)
Lymphocytes Relative: 12 % (ref 12–46)
MCH: 26.8 pg (ref 26.0–34.0)
MCHC: 30.7 g/dL (ref 30.0–36.0)
MCV: 87.3 fL (ref 78.0–100.0)
Monocytes Absolute: 1 10*3/uL (ref 0.1–1.0)
Monocytes Relative: 11 % (ref 3–12)
NEUTROS PCT: 76 % (ref 43–77)
Neutro Abs: 6.9 10*3/uL (ref 1.7–7.7)
Platelets: 239 10*3/uL (ref 150–400)
RBC: 3.06 MIL/uL — AB (ref 4.22–5.81)
RDW: 16.3 % — ABNORMAL HIGH (ref 11.5–15.5)
WBC: 9 10*3/uL (ref 4.0–10.5)

## 2014-11-01 LAB — URINALYSIS, ROUTINE W REFLEX MICROSCOPIC
Bilirubin Urine: NEGATIVE
Glucose, UA: NEGATIVE mg/dL
Hgb urine dipstick: NEGATIVE
KETONES UR: NEGATIVE mg/dL
LEUKOCYTES UA: NEGATIVE
Nitrite: NEGATIVE
PROTEIN: NEGATIVE mg/dL
Specific Gravity, Urine: 1.018 (ref 1.005–1.030)
UROBILINOGEN UA: 1 mg/dL (ref 0.0–1.0)
pH: 5 (ref 5.0–8.0)

## 2014-11-01 LAB — CBG MONITORING, ED
GLUCOSE-CAPILLARY: 53 mg/dL — AB (ref 70–99)
GLUCOSE-CAPILLARY: 58 mg/dL — AB (ref 70–99)
GLUCOSE-CAPILLARY: 60 mg/dL — AB (ref 70–99)
Glucose-Capillary: 42 mg/dL — CL (ref 70–99)
Glucose-Capillary: 28 mg/dL — CL (ref 70–99)
Glucose-Capillary: 112 mg/dL — ABNORMAL HIGH (ref 70–99)
Glucose-Capillary: 79 mg/dL (ref 70–99)
Glucose-Capillary: 45 mg/dL — ABNORMAL LOW (ref 70–99)
Glucose-Capillary: 85 mg/dL (ref 70–99)

## 2014-11-01 LAB — BASIC METABOLIC PANEL
Anion gap: 18 — ABNORMAL HIGH (ref 5–15)
BUN: 43 mg/dL — ABNORMAL HIGH (ref 6–23)
CO2: 22 mEq/L (ref 19–32)
Calcium: 9.9 mg/dL (ref 8.4–10.5)
Chloride: 100 mEq/L (ref 96–112)
Creatinine, Ser: 2.85 mg/dL — ABNORMAL HIGH (ref 0.50–1.35)
GFR calc Af Amer: 26 mL/min — ABNORMAL LOW (ref >90)
GFR, EST NON AFRICAN AMERICAN: 22 mL/min — AB (ref >90)
GLUCOSE: 36 mg/dL — AB (ref 70–99)
POTASSIUM: 3.8 meq/L (ref 3.7–5.3)
Sodium: 140 mEq/L (ref 137–147)

## 2014-11-01 LAB — POC OCCULT BLOOD, ED: Fecal Occult Bld: NEGATIVE

## 2014-11-01 MED ORDER — HYDROCODONE-ACETAMINOPHEN 10-325 MG PO TABS
1.0000 | ORAL_TABLET | Freq: Four times a day (QID) | ORAL | Status: DC | PRN
  Administered 2014-11-02 – 2014-11-04 (×7): 1 via ORAL
  Filled 2014-11-01 (×7): qty 1

## 2014-11-01 MED ORDER — ONDANSETRON HCL 4 MG PO TABS: 4.0000 mg | ORAL_TABLET | Freq: Four times a day (QID) | ORAL | Status: DC | PRN

## 2014-11-01 MED ORDER — LORATADINE 10 MG PO TABS
10.0000 mg | ORAL_TABLET | Freq: Every day | ORAL | Status: DC
  Administered 2014-11-02 – 2014-11-04 (×3): 10 mg via ORAL
  Filled 2014-11-01 (×3): qty 1

## 2014-11-01 MED ORDER — ENOXAPARIN SODIUM 40 MG/0.4ML ~~LOC~~ SOLN
40.0000 mg | SUBCUTANEOUS | Status: DC
  Administered 2014-11-02 – 2014-11-04 (×3): 40 mg via SUBCUTANEOUS
  Filled 2014-11-01 (×3): qty 0.4

## 2014-11-01 MED ORDER — HYDRALAZINE HCL 20 MG/ML IJ SOLN: 10.0000 mg | INTRAMUSCULAR | Status: DC | PRN

## 2014-11-01 MED ORDER — CYCLOBENZAPRINE HCL 10 MG PO TABS
10.0000 mg | ORAL_TABLET | Freq: Three times a day (TID) | ORAL | Status: DC | PRN
  Administered 2014-11-02 – 2014-11-03 (×3): 10 mg via ORAL
  Filled 2014-11-01 (×3): qty 1

## 2014-11-01 MED ORDER — ONDANSETRON HCL 4 MG/2ML IJ SOLN: 4.0000 mg | Freq: Four times a day (QID) | INTRAMUSCULAR | Status: DC | PRN

## 2014-11-01 MED ORDER — ACETAMINOPHEN 650 MG RE SUPP: 650.0000 mg | Freq: Four times a day (QID) | RECTAL | Status: DC | PRN

## 2014-11-01 MED ORDER — HYDROMORPHONE HCL 1 MG/ML IJ SOLN
1.0000 mg | INTRAMUSCULAR | Status: DC | PRN
  Administered 2014-11-02 (×2): 1 mg via INTRAVENOUS
  Filled 2014-11-01 (×2): qty 1

## 2014-11-01 MED ORDER — ACETAMINOPHEN 325 MG PO TABS: 650.0000 mg | ORAL_TABLET | Freq: Four times a day (QID) | ORAL | Status: DC | PRN

## 2014-11-01 MED ORDER — FENOFIBRATE 160 MG PO TABS
160.0000 mg | ORAL_TABLET | Freq: Every day | ORAL | Status: DC
  Administered 2014-11-02 – 2014-11-04 (×3): 160 mg via ORAL
  Filled 2014-11-01 (×3): qty 1

## 2014-11-01 MED ORDER — DEXTROSE 5 % IV SOLN
Freq: Once | INTRAVENOUS | Status: AC
  Administered 2014-11-01: 20:00:00 via INTRAVENOUS

## 2014-11-01 MED ORDER — DEXTROSE 50 % IV SOLN
1.0000 | Freq: Once | INTRAVENOUS | Status: AC
  Administered 2014-11-01: 50 mL via INTRAVENOUS
  Filled 2014-11-01: qty 50

## 2014-11-01 MED ORDER — HYDROMORPHONE HCL 1 MG/ML IJ SOLN
1.0000 mg | Freq: Once | INTRAMUSCULAR | Status: AC
  Administered 2014-11-01: 1 mg via INTRAVENOUS
  Filled 2014-11-01: qty 1

## 2014-11-01 MED ORDER — PANTOPRAZOLE SODIUM 40 MG PO TBEC: 40.0000 mg | DELAYED_RELEASE_TABLET | Freq: Every day | ORAL | Status: DC | PRN

## 2014-11-01 MED ORDER — ATORVASTATIN CALCIUM 20 MG PO TABS
20.0000 mg | ORAL_TABLET | Freq: Every day | ORAL | Status: DC
  Administered 2014-11-02 – 2014-11-04 (×3): 20 mg via ORAL
  Filled 2014-11-01 (×3): qty 1

## 2014-11-01 MED ORDER — DEXTROSE 50 % IV SOLN
1.0000 | Freq: Once | INTRAVENOUS | Status: AC
  Administered 2014-11-01: 50 mL via INTRAVENOUS

## 2014-11-01 MED ORDER — ASPIRIN EC 81 MG PO TBEC
81.0000 mg | DELAYED_RELEASE_TABLET | Freq: Every day | ORAL | Status: DC
  Administered 2014-11-02 – 2014-11-04 (×3): 81 mg via ORAL
  Filled 2014-11-01 (×3): qty 1

## 2014-11-01 MED ORDER — DEXTROSE 10 % IV SOLN
INTRAVENOUS | Status: DC
  Administered 2014-11-01: 20:00:00 via INTRAVENOUS

## 2014-11-01 MED ORDER — DEXTROSE 10 % IV SOLN
INTRAVENOUS | Status: DC
  Administered 2014-11-02: 06:00:00 via INTRAVENOUS

## 2014-11-01 NOTE — ED Notes (Addendum)
Pt was released Friday for having back surgery and his glucose has been fluctuating since then. Wife states his sugar 24 earlier today. EMS came out and gave something and his sugar was 138 after that. Pt's sugar was 58 just before leaving the house to come up here. Pt is alert and oriented at present. Sugar is 42 in triage.

## 2014-11-01 NOTE — H&P (Signed)
[facility] History and Physical  Erik Martin IWL:[id number] DOB: 1951-03-25 DOA: 11/01/2014  Referring physician: ER physician. PCP: Leonides Sake, MD   Chief Complaint: Low blood sugar.  HPI: Erik Martin is a 64 y.o. male with history of diabetes mellitus, hypertension, hyperlipidemia who has had a recent lumbar fusion surgery 4 days ago was brought to the ER the patient's family noted that patient has been having recurrent episodes of hypoglycemia with symptoms. Patient's symptoms started last night by patient being confused and diaphoretic. Patient was given was following to the 18s. Patient last took his medications yesterday morning. Patient denies any nausea vomiting abdominal pain but did have increased bowel movement yesterday. Denies any diarrhea or any blood in the stools or dark stools. Denies any chest pain or shortness of breath. Patient also was noticed to have mild fever. In the ER patient was found to be hypoglycemic and was given D50 and has to be started on D10 since patient was becoming hypoglycemic despite giving D50. Patient's labs reveal acute renal failure and also worsening anemia. Stool for occult blood has been negative. Since patient is also febrile but cultures and chest x-ray has been ordered and UA has been unremarkable.   Review of Systems: As presented in the history of presenting illness, rest negative.  Past Medical History  Diagnosis Date  . Hypertension   . Diabetes mellitus without complication   . Depression   . GERD (gastroesophageal reflux disease)   . Arthritis   . Lumbar pain   . Cervical radiculopathy   . Hypomagnesemia   . B12 deficiency   . Hyperlipidemia    Past Surgical History  Procedure Laterality Date  . Shoulder surgery Left     ROTOROR CUFF,BONE SPUR  . Fracture surgery Left   . Elbow surgery Left     AS CHILD  . Back surgery      FUSION   Social History:  reports that he has been smoking Cigarettes.  He has a 40  pack-year smoking history. He has never used smokeless tobacco. He reports that he drinks about 1.8 oz of alcohol per week. He reports that he does not use illicit drugs. Where does patient live home. Can patient participate in ADLs? Yes.  Allergies  Allergen Reactions  . Niaspan [Niacin] Other (See Comments)    Flushing, burning    Family History:  Family History  Problem Relation Age of Onset  . Diabetes Mellitus II Sister   . Diabetes Mellitus II Brother       Prior to Admission medications   Medication Sig Start Date End Date Taking? Authorizing Provider  amLODipine (NORVASC) 5 MG tablet Take 5 mg by mouth daily.   Yes Historical Provider, MD  aspirin EC 81 MG tablet Take 81 mg by mouth daily.   Yes Historical Provider, MD  atorvastatin (LIPITOR) 20 MG tablet Take 20 mg by mouth daily.   Yes Historical Provider, MD  cetirizine (ZYRTEC) 10 MG tablet Take 10 mg by mouth daily as needed for allergies.   Yes Historical Provider, MD  Coenzyme Q10 (CO Q-10 PO) Take 200 mg by mouth daily.   Yes Historical Provider, MD  cyclobenzaprine (FLEXERIL) 10 MG tablet Take 1 tablet (10 mg total) by mouth 3 (three) times daily as needed for muscle spasms. 10/29/14  Yes Elaina Hoops, MD  fenofibrate micronized (LOFIBRA) 134 MG capsule Take 134 mg by mouth daily before breakfast.   Yes Historical Provider, MD  glimepiride (AMARYL)  4 MG tablet Take 4 mg by mouth daily with breakfast.   Yes Historical Provider, MD  hydrochlorothiazide (HYDRODIURIL) 25 MG tablet Take 12.5 mg by mouth daily.   Yes Historical Provider, MD  HYDROcodone-acetaminophen (NORCO) 10-325 MG per tablet Take 1 tablet by mouth 4 (four) times daily as needed (pain).   Yes Historical Provider, MD  hydrocortisone cream 1 % Apply 1 application topically 2 (two) times daily.   Yes Historical Provider, MD  HYDROmorphone (DILAUDID) 4 MG tablet Take 1 tablet (4 mg total) by mouth every 3 (three) hours as needed for severe pain. 10/29/14  Yes  Elaina Hoops, MD  lisinopril (PRINIVIL,ZESTRIL) 20 MG tablet Take 40 mg by mouth daily.   Yes Historical Provider, MD  magnesium oxide (MAG-OX) 400 MG tablet Take 400 mg by mouth daily.   Yes Historical Provider, MD  pantoprazole (PROTONIX) 40 MG tablet Take 40 mg by mouth daily as needed (acid reflux).   Yes Historical Provider, MD  pioglitazone-metformin (ACTOPLUS MET) 15-850 MG per tablet Take 1 tablet by mouth 2 (two) times daily with a meal.   Yes Historical Provider, MD  vitamin B-12 (CYANOCOBALAMIN) 1000 MCG tablet Take 2,000 mcg by mouth daily.   Yes Historical Provider, MD    Physical Exam: Filed Vitals:   11/01/14 2105 11/01/14 2115 11/01/14 2130 11/01/14 2145  BP: 101/56 101/48 115/59 98/40  Pulse: 96 98 95 91  Temp:      TempSrc:      Resp: $Remo'24 19 17 20  'Ewllg$ Height:      Weight:      SpO2: 93% 96% 94% 94%     General:  Well-developed and nourished.  Eyes: anicteric no pallor.  ENT: no discharge from the ears eyes nose mouth.  Neck: no mass felt.  Cardiovascular: S1-S2 heard.  Respiratory: no rhonchi or crepitations.  Abdomen: soft nontender bowel sounds present. No guarding or rigidity.  Skin: no rash. Dressing seen in the lower back from recent surgery.  Musculoskeletal: no edema.Patient is able to move both lower extremities without difficulty.  Psychiatric: APPEARS NORMAL.  Neurologic:Alert awake oriented to time place and person. Moves all extremities.  Labs on Admission:  Basic Metabolic Panel:  Recent Labs Lab 10/27/14 1211 11/01/14 1844  NA 138 140  K 4.5 3.8  CL  --  100  CO2  --  22  GLUCOSE 189* 36*  BUN  --  43*  CREATININE  --  2.85*  CALCIUM  --  9.9   Liver Function Tests: No results for input(s): AST, ALT, ALKPHOS, BILITOT, PROT, ALBUMIN in the last 168 hours. No results for input(s): LIPASE, AMYLASE in the last 168 hours. No results for input(s): AMMONIA in the last 168 hours. CBC:  Recent Labs Lab 10/27/14 1211 11/01/14 1844   WBC  --  9.0  NEUTROABS  --  6.9  HGB 11.6* 8.2*  HCT 34.0* 26.7*  MCV  --  87.3  PLT  --  239   Cardiac Enzymes: No results for input(s): CKTOTAL, CKMB, CKMBINDEX, TROPONINI in the last 168 hours.  BNP (last 3 results) No results for input(s): PROBNP in the last 8760 hours. CBG:  Recent Labs Lab 11/01/14 1931 11/01/14 2018 11/01/14 2033 11/01/14 2057 11/01/14 2135  GLUCAP 60* 28* 112* 79 58*    Radiological Exams on Admission: No results found.   Assessment/Plan Active Problems:   Hypoglycemia   Acute renal failure   Diabetes mellitus type 2, uncontrolled   Anemia, blood  loss   Hypertension   Hyperlipidemia   1. HYPOGLYCEMIA - probably due to poor oral intake and further worsened by patient's renal failure. At this time we will hold off all patient's antidiabetic medications and continue D10 and closely follow CBGs every hour.Check cortisol and hemoglobin A1c. 2. Acute renal failure - probably due to dehydration and poor oral intake and mild hypotension. Hold antihypertensives including lisinopril and diuretics and closely follow intake output and metabolic panel and check FENa. 3. Fever - UA is unremarkable. Check blood cultures and chest x-ray. Patient does have low back pain and closely follow if there is any further worsening of the pain may need further radiological studies to assess low back as patient has had recent surgery. 4. Anemia - suspect secondary to blood loss. Stool for occult blood has been negative. Patient does not have any signs of hemolytic process. Closely follow CBC and I have ordered type and screen. 5. Reason low back surgery - see #3. 6. Hyperlipidemia.    Code Status: full code.  Family Communication: patient's family at the bedside.  Disposition Plan: admit to inpatient.    Jossalyn Forgione N. [facility] Pager [phone].  If 7PM-7AM, please contact night-coverage [email or web address] Password [email or web address] 11/01/2014, 10:07  PM

## 2014-11-01 NOTE — ED Notes (Signed)
Dr. Taylor at bedside.

## 2014-11-01 NOTE — ED Notes (Signed)
Pt requesting pain medication. Pt reports back pain due to recent back surgery. Dr. Lovena Le informed.

## 2014-11-01 NOTE — ED Notes (Signed)
CBG 58 

## 2014-11-01 NOTE — ED Notes (Signed)
CBG of 42 in triage

## 2014-11-01 NOTE — ED Provider Notes (Signed)
CSN: [id number]     Arrival date & time 11/01/14  4098 History   First MD Initiated Contact with Patient 11/01/14 1848     Chief Complaint  Patient presents with  . Hypoglycemia     (Consider location/radiation/quality/duration/timing/severity/associated sxs/prior Treatment) Patient is a 63 y.o. male presenting with hypoglycemia. The history is provided by the patient. No language interpreter was used.  Hypoglycemia Initial blood sugar:  42 Onset quality:  Unable to specify Duration:  2 days Timing:  Intermittent Progression:  Waxing and waning Chronicity:  New Diabetic status:  Controlled with oral medications Current diabetic therapy:  Actoplus, glimepiride Time since last antidiabetic medication:  1 day Context: decreased oral intake   Context: not recent illness   Context comment:  Back surgery 10/29 Relieved by:  IV glucose Ineffective treatments:  Eating Associated symptoms: tremors   Associated symptoms: no seizures, no shortness of breath and no vomiting   Risk factors: recent surgery   Risk factors: no kidney disease     Past Medical History  Diagnosis Date  . Hypertension   . Diabetes mellitus without complication   . Depression   . GERD (gastroesophageal reflux disease)   . Arthritis   . Lumbar pain   . Cervical radiculopathy   . Hypomagnesemia   . B12 deficiency   . Hyperlipidemia    Past Surgical History  Procedure Laterality Date  . Shoulder surgery Left     ROTOROR CUFF,BONE SPUR  . Fracture surgery Left   . Elbow surgery Left     AS CHILD  . Back surgery      FUSION   No family history on file. History  Substance Use Topics  . Smoking status: Current Every Day Smoker -- 1.00 packs/day for 40 years    Types: Cigarettes  . Smokeless tobacco: Never Used  . Alcohol Use: 1.8 oz/week    3 Shots of liquor per week    Review of Systems  Constitutional: Positive for fever (post op, now resolved).  Respiratory: Negative for cough, chest  tightness and shortness of breath.   Gastrointestinal: Negative for nausea, vomiting and abdominal pain.  Genitourinary: Positive for dysuria.  Neurological: Positive for tremors. Negative for seizures.  All other systems reviewed and are negative.     Allergies  Niaspan  Home Medications   Prior to Admission medications   Medication Sig Start Date End Date Taking? Authorizing Provider  amLODipine (NORVASC) 5 MG tablet Take 5 mg by mouth daily.    Historical Provider, MD  aspirin EC 81 MG tablet Take 81 mg by mouth daily.    Historical Provider, MD  atorvastatin (LIPITOR) 20 MG tablet Take 20 mg by mouth daily.    Historical Provider, MD  cetirizine (ZYRTEC) 10 MG tablet Take 10 mg by mouth daily as needed for allergies.    Historical Provider, MD  Coenzyme Q10 (CO Q-10 PO) Take 200 mg by mouth daily.    Historical Provider, MD  cyclobenzaprine (FLEXERIL) 10 MG tablet Take 1 tablet (10 mg total) by mouth 3 (three) times daily as needed for muscle spasms. 10/29/14   Elaina Hoops, MD  fenofibrate micronized (LOFIBRA) 134 MG capsule Take 134 mg by mouth daily before breakfast.    Historical Provider, MD  glimepiride (AMARYL) 4 MG tablet Take 4 mg by mouth daily with breakfast.    Historical Provider, MD  hydrochlorothiazide (HYDRODIURIL) 25 MG tablet Take 12.5 mg by mouth daily.    Historical Provider, MD  HYDROcodone-acetaminophen (  NORCO) 10-325 MG per tablet Take 1 tablet by mouth 4 (four) times daily as needed (pain).    Historical Provider, MD  HYDROmorphone (DILAUDID) 4 MG tablet Take 1 tablet (4 mg total) by mouth every 3 (three) hours as needed for severe pain. 10/29/14   Elaina Hoops, MD  lisinopril (PRINIVIL,ZESTRIL) 20 MG tablet Take 40 mg by mouth daily.    Historical Provider, MD  magnesium oxide (MAG-OX) 400 MG tablet Take 400 mg by mouth daily.    Historical Provider, MD  pantoprazole (PROTONIX) 40 MG tablet Take 40 mg by mouth daily as needed (acid reflux).    Historical  Provider, MD  pioglitazone-metformin (ACTOPLUS MET) 15-850 MG per tablet Take 1 tablet by mouth 2 (two) times daily with a meal.    Historical Provider, MD  vitamin B-12 (CYANOCOBALAMIN) 1000 MCG tablet Take 2,000 mcg by mouth daily.    Historical Provider, MD   BP 99/50 mmHg  Pulse 99  Temp(Src) 99.8 F (37.7 C) (Oral)  Resp 22  Ht _0  (1.727 m)  Wt 250 lb (113.399 kg)  BMI 38.02 kg/m2  SpO2 96% Physical Exam  Constitutional: He is oriented to person, place, and time. He appears well-developed and well-nourished. No distress.  HENT:  Mouth/Throat: Oropharynx is clear and moist.  Eyes: EOM are normal. Pupils are equal, round, and reactive to light.  Cardiovascular: Normal rate, regular rhythm and normal heart sounds.   Pulmonary/Chest: Effort normal and breath sounds normal.  Abdominal: Soft. Bowel sounds are normal. He exhibits distension. There is no tenderness.  Musculoskeletal: Normal range of motion.  Neurological: He is alert and oriented to person, place, and time.  Skin: Skin is warm.     Vitals reviewed.   ED Course  Procedures (including critical care time) Labs Review Labs Reviewed  CBC WITH DIFFERENTIAL - Abnormal; Notable for the following:    RBC 3.06 (*)    Hemoglobin 8.2 (*)    HCT 26.7 (*)    RDW 16.3 (*)    All other components within normal limits  BASIC METABOLIC PANEL - Abnormal; Notable for the following:    Glucose, Bld 36 (*)    BUN 43 (*)    Creatinine, Ser 2.85 (*)    GFR calc non Af Amer 22 (*)    GFR calc Af Amer 26 (*)    Anion gap 18 (*)    All other components within normal limits  URINALYSIS, ROUTINE W REFLEX MICROSCOPIC - Abnormal; Notable for the following:    Color, Urine AMBER (*)    APPearance HAZY (*)    All other components within normal limits  CBG MONITORING, ED - Abnormal; Notable for the following:    Glucose-Capillary 42 (*)    All other components within normal limits  CBG MONITORING, ED - Abnormal; Notable for the  following:    Glucose-Capillary 60 (*)    All other components within normal limits  CBG MONITORING, ED - Abnormal; Notable for the following:    Glucose-Capillary 28 (*)    All other components within normal limits  CBG MONITORING, ED - Abnormal; Notable for the following:    Glucose-Capillary 112 (*)    All other components within normal limits  CBG MONITORING, ED - Abnormal; Notable for the following:    Glucose-Capillary 58 (*)    All other components within normal limits  CBG MONITORING, ED - Abnormal; Notable for the following:    Glucose-Capillary 45 (*)    All  other components within normal limits  CULTURE, BLOOD (ROUTINE X 2)  CULTURE, BLOOD (ROUTINE X 2)  POC OCCULT BLOOD, ED  CBG MONITORING, ED  TYPE AND SCREEN    Imaging Review No results found.   EKG Interpretation None      MDM   Final diagnoses:  None    63 y/o male with h/o laminectomy 10/29, DM presenting with hypoglycemia. Last took DM medications yesterday. Hypoglycemic yesterday evening and persistent today. BS 42 on arrival. Mentation normal. Denies infectious symptoms. Had postoperative fever which resolved. Decreased PO intake. Labs remarkable for AKI. D50 x 3 given as well as D5 and D10 infusions. Transient increase in BS but quickly declines. Will admit to step down for further management of hypoglycemia likely secondary to medications and AKI.      Amparo Bristol, MD 11/02/14 [place], MD 11/04/14 [id number]

## 2014-11-01 NOTE — ED Notes (Signed)
Pt given cup of apple juice and [place] sandwich.

## 2014-11-02 ENCOUNTER — Inpatient Hospital Stay (HOSPITAL_COMMUNITY): Payer: Medicare Other

## 2014-11-02 DIAGNOSIS — D649 Anemia, unspecified: Secondary | ICD-10-CM | POA: Diagnosis present

## 2014-11-02 LAB — COMPREHENSIVE METABOLIC PANEL
ALBUMIN: 3.1 g/dL — AB (ref 3.5–5.2)
ALT: 33 U/L (ref 0–53)
AST: 51 U/L — AB (ref 0–37)
Alkaline Phosphatase: 46 U/L (ref 39–117)
Anion gap: 18 — ABNORMAL HIGH (ref 5–15)
BUN: 40 mg/dL — ABNORMAL HIGH (ref 6–23)
CALCIUM: 10.2 mg/dL (ref 8.4–10.5)
CO2: 23 meq/L (ref 19–32)
Chloride: 96 mEq/L (ref 96–112)
Creatinine, Ser: 2.49 mg/dL — ABNORMAL HIGH (ref 0.50–1.35)
GFR calc Af Amer: 30 mL/min — ABNORMAL LOW (ref >90)
GFR calc non Af Amer: 26 mL/min — ABNORMAL LOW (ref >90)
Glucose, Bld: 54 mg/dL — ABNORMAL LOW (ref 70–99)
Potassium: 3.6 mEq/L — ABNORMAL LOW (ref 3.7–5.3)
SODIUM: 137 meq/L (ref 137–147)
Total Bilirubin: 0.4 mg/dL (ref 0.3–1.2)
Total Protein: 7 g/dL (ref 6.0–8.3)

## 2014-11-02 LAB — GLUCOSE, CAPILLARY
GLUCOSE-CAPILLARY: 109 mg/dL — AB (ref 70–99)
GLUCOSE-CAPILLARY: 117 mg/dL — AB (ref 70–99)
GLUCOSE-CAPILLARY: 40 mg/dL — AB (ref 70–99)
GLUCOSE-CAPILLARY: 75 mg/dL (ref 70–99)
Glucose-Capillary: 31 mg/dL — CL (ref 70–99)
Glucose-Capillary: 82 mg/dL (ref 70–99)
Glucose-Capillary: 61 mg/dL — ABNORMAL LOW (ref 70–99)
Glucose-Capillary: 103 mg/dL — ABNORMAL HIGH (ref 70–99)
Glucose-Capillary: 67 mg/dL — ABNORMAL LOW (ref 70–99)
Glucose-Capillary: 105 mg/dL — ABNORMAL HIGH (ref 70–99)
Glucose-Capillary: 146 mg/dL — ABNORMAL HIGH (ref 70–99)
Glucose-Capillary: 154 mg/dL — ABNORMAL HIGH (ref 70–99)
Glucose-Capillary: 150 mg/dL — ABNORMAL HIGH (ref 70–99)
Glucose-Capillary: 164 mg/dL — ABNORMAL HIGH (ref 70–99)

## 2014-11-02 LAB — CBC WITH DIFFERENTIAL/PLATELET
Basophils Absolute: 0 10*3/uL (ref 0.0–0.1)
Basophils Relative: 0 % (ref 0–1)
EOS ABS: 0.2 10*3/uL (ref 0.0–0.7)
EOS PCT: 2 % (ref 0–5)
HCT: 26.3 % — ABNORMAL LOW (ref 39.0–52.0)
Hemoglobin: 8.3 g/dL — ABNORMAL LOW (ref 13.0–17.0)
LYMPHS PCT: 18 % (ref 12–46)
Lymphs Abs: 1.6 10*3/uL (ref 0.7–4.0)
MCH: 27 pg (ref 26.0–34.0)
MCHC: 31.6 g/dL (ref 30.0–36.0)
MCV: 85.7 fL (ref 78.0–100.0)
MONO ABS: 1.4 10*3/uL — AB (ref 0.1–1.0)
Monocytes Relative: 16 % — ABNORMAL HIGH (ref 3–12)
Neutro Abs: 5.7 10*3/uL (ref 1.7–7.7)
Neutrophils Relative %: 64 % (ref 43–77)
PLATELETS: 235 10*3/uL (ref 150–400)
RBC: 3.07 MIL/uL — AB (ref 4.22–5.81)
RDW: 16.3 % — ABNORMAL HIGH (ref 11.5–15.5)
WBC: 9 10*3/uL (ref 4.0–10.5)

## 2014-11-02 LAB — CORTISOL: Cortisol, Plasma: 25.1 ug/dL

## 2014-11-02 LAB — CREATININE, SERUM
Creatinine, Ser: 2.71 mg/dL — ABNORMAL HIGH (ref 0.50–1.35)
GFR calc non Af Amer: 23 mL/min — ABNORMAL LOW (ref >90)
GFR, EST AFRICAN AMERICAN: 27 mL/min — AB (ref >90)

## 2014-11-02 LAB — CREATININE, URINE, RANDOM: CREATININE, URINE: 181.49 mg/dL

## 2014-11-02 LAB — SODIUM, URINE, RANDOM: SODIUM UR: 47 meq/L

## 2014-11-02 LAB — HEMOGLOBIN A1C
HEMOGLOBIN A1C: 6.3 % — AB (ref <5.7)
MEAN PLASMA GLUCOSE: 134 mg/dL — AB (ref <117)

## 2014-11-02 LAB — TROPONIN I: Troponin I: <0.3 ng/mL (ref <0.30)

## 2014-11-02 LAB — CBG MONITORING, ED: Glucose-Capillary: 55 mg/dL — ABNORMAL LOW (ref 70–99)

## 2014-11-02 MED ORDER — POLYETHYLENE GLYCOL 3350 17 G PO PACK
17.0000 g | PACK | Freq: Every day | ORAL | Status: DC
  Administered 2014-11-04: 17 g via ORAL
  Filled 2014-11-02 (×3): qty 1

## 2014-11-02 MED ORDER — DEXTROSE 50 % IV SOLN
INTRAVENOUS | Status: AC
  Administered 2014-11-02: 25 mL
  Filled 2014-11-02: qty 50

## 2014-11-02 MED ORDER — DEXTROSE 50 % IV SOLN
INTRAVENOUS | Status: AC
  Administered 2014-11-02: 01:00:00
  Filled 2014-11-02: qty 50

## 2014-11-02 MED ORDER — DEXTROSE 5 % IV SOLN: INTRAVENOUS | Status: DC

## 2014-11-02 MED ORDER — DEXTROSE-NACL 5-0.9 % IV SOLN
INTRAVENOUS | Status: DC
  Administered 2014-11-02: 125 mL/h via INTRAVENOUS
  Administered 2014-11-02: 22:00:00 via INTRAVENOUS

## 2014-11-02 MED ORDER — DOCUSATE SODIUM 100 MG PO CAPS
100.0000 mg | ORAL_CAPSULE | Freq: Two times a day (BID) | ORAL | Status: DC
  Administered 2014-11-02 – 2014-11-04 (×4): 100 mg via ORAL
  Filled 2014-11-02 (×3): qty 1

## 2014-11-02 MED ORDER — PNEUMOCOCCAL VAC POLYVALENT 25 MCG/0.5ML IJ INJ
0.5000 mL | INJECTION | INTRAMUSCULAR | Status: AC
  Administered 2014-11-03: 0.5 mL via INTRAMUSCULAR
  Filled 2014-11-02: qty 0.5

## 2014-11-02 MED ORDER — DEXTROSE 50 % IV SOLN
INTRAVENOUS | Status: AC
  Administered 2014-11-02: 03:00:00
  Filled 2014-11-02: qty 50

## 2014-11-02 NOTE — Progress Notes (Signed)
Hypoglycemic Event  CBG: 31  Treatment: 1 ampule Dextrose 50  Symptoms: none  Follow-up CBG: Time: 0126 CBG Result: 117  Possible Reasons for Event:  Comments/MD notified:    Erik Martin, Erik Martin  Remember to initiate Hypoglycemia Order Set & complete

## 2014-11-02 NOTE — Progress Notes (Signed)
Hypoglycemic Event  CBG:40  Treatment: 8oz oral fluid  Symptoms: none  Follow-up CBG: Time: 0107 CBG Result: 31  Possible Reasons for Event: unknown  Comments/MD notified:    Erik Martin, Blondell Reveal  Remember to initiate Hypoglycemia Order Set & complete

## 2014-11-02 NOTE — Progress Notes (Signed)
[facility]  ELO ROULAND [id number] DOB: 10-Sep-1951 DOA: 11/01/2014 PCP: Leonides Sake, MD  Brief narrative: 31 male patient with a history diabetes, hypertension and dyslipidemia. Recent lumbar fusion surgery 4 days prior to presentation. His family brought him to the ER because he been experiencing recurrent episodes of hypoglycemia with symptoms. Patient symptoms had worsened 24 hours prior to presentation and he developed diaphoresis and confusion along with the hypoglycemia. His sugars had been dipping as low as the 40s. The patient continued to take his usual diabetes medication Amaryl.  In the ER the patient was found to be hypoglycemic. At one point the CBG dipped to as low as 28.  He was given multiple doses of D50 boluses then was started on a D10 infusion. In addition labs revealed acute renal failure and progressive anemia. Fecal occult blood was negative. Patient was mildly febrile but urinalysis and chest x-ray were unremarkable.  HPI/Subjective: Alert and sitting up in chair eating breakfast. No specific complaints.  Assessment/Plan:  Hypoglycemia in DM CBG consistently greater than 100 on 10% dextrose infusion - change to D5 normal saline since sodium also concurrently decreasing - decreased CBG checks to every 4 hours - continue to hold Amaryl - hemoglobin A1c 6.3 - will require adjustments of medications before discharge - does not appear to have a septic etiology  Acute renal failure Multifactorial related to ongoing use of diuretics and ACE inhibitor and suspected poor oral intake postoperatively - continue to hold offending medications - continue IV fluid hydration - Baseline renal function preoperatively BUN 14 creatinine 1.12 - at presentation BUN 43 creatinine 2.85 - today BUN 40 creatinine 2.49 with hydration   Hypertension Blood pressure actually soft and likely reflective of dehydration state - continue to hold home  medications  Anemia, Acute blood loss likely postoperative Preoperative hemoglobin was 12.1 on 10/19 - subsequently decreased to 11.6 postoperatively on 10/29 - since admission has averaged between 7.8 and 8.3 but is heme negative - follow  Hyperlipidemia Resume tx when oral intake noramlizes  Mild constipation contributing to poor intake postoperatively - begin Colace and MiraLAX  DVT prophylaxis: Lovenox Code Status: FULL Family Communication: no family at bedside Disposition Plan/Expected LOS: transfer to floor  Consultants: none  Procedures: none  Antibiotics: none  Objective: Blood pressure 102/52, pulse 86, temperature 98.2 F (36.8 C), temperature source Oral, resp. rate 22, height 5\' 8"  (1.727 m), weight 252 lb 13.9 oz (114.7 kg), SpO2 97 %.  Intake/Output Summary (Last 24 hours) at 11/02/14 1237 Last data filed at 11/02/14 1100  Gross per 24 hour  Intake 2073.33 ml  Output    550 ml  Net 1523.33 ml   Exam: Gen: No acute respiratory distress Chest: Clear to auscultation bilaterally without wheezes, rhonchi or crackles, room air Cardiac: Regular rate and rhythm, S1-S2, no rubs murmurs or gallops, no peripheral edema, no JVD Abdomen: Soft nontender nondistended without obvious hepatosplenomegaly, no ascites Extremities: Symmetrical in appearance without cyanosis, clubbing or effusion  Scheduled Meds:  Scheduled Meds: . aspirin EC  81 mg Oral Daily  . atorvastatin  20 mg Oral Daily  . enoxaparin (LOVENOX) injection  40 mg Subcutaneous Q24H  . fenofibrate  160 mg Oral Daily  . loratadine  10 mg Oral Daily  . [START ON 11/03/2014] pneumococcal 23 valent vaccine  0.5 mL Intramuscular Tomorrow-1000   Continuous Infusions: . dextrose 5 % and 0.9% NaCl 125 mL/hr (11/02/14 1000)  Data Reviewed: Basic Metabolic Panel:  Recent Labs Lab 10/27/14 1211 11/01/14 1844 11/01/14 2340 11/02/14 0311  NA 138 140  --  137  K 4.5 3.8  --  3.6*  CL  --  100  --  96   CO2  --  22  --  23  GLUCOSE 189* 36*  --  54*  BUN  --  43*  --  40*  CREATININE  --  2.85* 2.71* 2.49*  CALCIUM  --  9.9  --  10.2   Liver Function Tests:  Recent Labs Lab 11/02/14 0311  AST 51*  ALT 33  ALKPHOS 46  BILITOT 0.4  PROT 7.0  ALBUMIN 3.1*   No results for input(s): LIPASE, AMYLASE in the last 168 hours. No results for input(s): AMMONIA in the last 168 hours. CBC:  Recent Labs Lab 10/27/14 1211 11/01/14 1844 11/01/14 2340 11/02/14 0311  WBC  --  9.0 7.9 9.0  NEUTROABS  --  6.9  --  5.7  HGB 11.6* 8.2* 7.8* 8.3*  HCT 34.0* 26.7* 25.2* 26.3*  MCV  --  87.3 85.1 85.7  PLT  --  239 223 235   Cardiac Enzymes:  Recent Labs Lab 11/01/14 2340  TROPONINI <0.30   BNP (last 3 results) No results for input(s): PROBNP in the last 8760 hours. CBG:  Recent Labs Lab 11/02/14 0545 11/02/14 0605 11/02/14 0708 11/02/14 0832 11/02/14 1218  GLUCAP 67* 103* 105* 146* 154*    No results found for this or any previous visit (from the past 240 hour(s)).   Studies:  Recent x-ray studies have been reviewed in detail by the Attending Physician  Time spent :  [place], ANP [facility] Office  [phone] Pager [phone]  **If unable to reach the above provider after paging please contact the [place] @ [phone]  On-Call/Text Page:      [email or web address]      password [email or web address]  If 7PM-7AM, please contact night-coverage [email or web address] Password [email or web address] 11/02/2014, 12:37 PM   LOS: 1 day   I have personally examined this patient and reviewed the entire database. I have reviewed the above note, made any necessary editorial changes, and agree with its content.  Cherene Altes, MD [facility]

## 2014-11-02 NOTE — Progress Notes (Signed)
Pt prepared for transfer to [facility] at 140O. See flow sheet for tight pain management.

## 2014-11-03 DIAGNOSIS — N179 Acute kidney failure, unspecified: Secondary | ICD-10-CM | POA: Diagnosis not present

## 2014-11-03 DIAGNOSIS — I10 Essential (primary) hypertension: Secondary | ICD-10-CM

## 2014-11-03 LAB — BASIC METABOLIC PANEL
Anion gap: 15 (ref 5–15)
BUN: 28 mg/dL — ABNORMAL HIGH (ref 6–23)
CALCIUM: 9.9 mg/dL (ref 8.4–10.5)
CO2: 24 mEq/L (ref 19–32)
CREATININE: 1.67 mg/dL — AB (ref 0.50–1.35)
Chloride: 100 mEq/L (ref 96–112)
GFR calc Af Amer: 49 mL/min — ABNORMAL LOW (ref >90)
GFR calc non Af Amer: 42 mL/min — ABNORMAL LOW (ref >90)
GLUCOSE: 242 mg/dL — AB (ref 70–99)
Potassium: 4.3 mEq/L (ref 3.7–5.3)
Sodium: 139 mEq/L (ref 137–147)

## 2014-11-03 LAB — GLUCOSE, CAPILLARY
GLUCOSE-CAPILLARY: 210 mg/dL — AB (ref 70–99)
GLUCOSE-CAPILLARY: 239 mg/dL — AB (ref 70–99)
GLUCOSE-CAPILLARY: 244 mg/dL — AB (ref 70–99)
GLUCOSE-CAPILLARY: 250 mg/dL — AB (ref 70–99)
Glucose-Capillary: 246 mg/dL — ABNORMAL HIGH (ref 70–99)
Glucose-Capillary: 263 mg/dL — ABNORMAL HIGH (ref 70–99)
Glucose-Capillary: 271 mg/dL — ABNORMAL HIGH (ref 70–99)
Glucose-Capillary: 259 mg/dL — ABNORMAL HIGH (ref 70–99)

## 2014-11-03 LAB — CBC
HCT: 27.5 % — ABNORMAL LOW (ref 39.0–52.0)
HEMOGLOBIN: 8.4 g/dL — AB (ref 13.0–17.0)
MCH: 26.2 pg (ref 26.0–34.0)
MCHC: 30.5 g/dL (ref 30.0–36.0)
MCV: 85.7 fL (ref 78.0–100.0)
Platelets: 256 10*3/uL (ref 150–400)
RBC: 3.21 MIL/uL — ABNORMAL LOW (ref 4.22–5.81)
RDW: 15.7 % — ABNORMAL HIGH (ref 11.5–15.5)
WBC: 7.2 10*3/uL (ref 4.0–10.5)

## 2014-11-03 MED ORDER — PIOGLITAZONE HCL 30 MG PO TABS
30.0000 mg | ORAL_TABLET | Freq: Every day | ORAL | Status: DC
  Administered 2014-11-03 – 2014-11-04 (×2): 30 mg via ORAL
  Filled 2014-11-03 (×2): qty 1

## 2014-11-03 MED ORDER — INSULIN ASPART 100 UNIT/ML ~~LOC~~ SOLN
0.0000 [IU] | Freq: Three times a day (TID) | SUBCUTANEOUS | Status: DC
  Administered 2014-11-03: 5 [IU] via SUBCUTANEOUS
  Administered 2014-11-03: 3 [IU] via SUBCUTANEOUS
  Administered 2014-11-03: 5 [IU] via SUBCUTANEOUS
  Administered 2014-11-04 (×2): 3 [IU] via SUBCUTANEOUS

## 2014-11-03 NOTE — Plan of Care (Signed)
Problem: Phase I Progression Outcomes Goal: OOB as tolerated unless otherwise ordered Outcome: Completed/Met Date Met:  11/03/14     

## 2014-11-03 NOTE — Progress Notes (Signed)
pts CBG have been 210 @ 12am and 246 @ 4am, Dr. Baltazar Najjar notified and ordered to turn fluids down to [facility] (26ml/hr) and recheck at 645am.  Pt stable, no s/s of hypo/hyperglycemia

## 2014-11-03 NOTE — Progress Notes (Signed)
PATIENT DETAILS Name: Erik Martin Age: 63 y.o. Sex: male Date of Birth: February 28, 1951 Admit Date: 11/01/2014 Admitting Physician Rise Patience, MD [id number] L, MD  Subjective: No major complaints  Assessment/Plan: Active Problems:   Hypoglycemia:secondary to development of ARF and use of Amaryl. Admitted, provided supportive care. Hypoglycemia has resolved.    Acute renal failure:pre-renal-on ACEI/Diuretics/perioperative blood loss-improving.    Diabetes mellitus type 2, uncontrolled:on 3 oral agents prior to admission, given increasing CBG's-start SSI,restart Actos. If creatinine continues to improve, restart Metformin.    Anemia:suspect secondary to IVF, ?peri-operative blood loss. Monitor Hb. FOBT neg. No other cause of acute drop in Hb apparent.    Hypertension:BP currently controlled without use of anti-hypertensives. Monitor and restart.    Hyperlipidemia:c/w Lipitor and fenofibrate   Anemia  Disposition: Remain inpatient-suspect home in am  Antibiotics:  None  DVT Prophylaxis: Prophylactic Lovenox   Code Status: Full code   Family Communication Spouse/Step Daughter at bedside  Procedures:  None  CONSULTS:  None  Time spent 40 minutes-which includes 50% of the time with face-to-face with patient/ family and coordinating care related to the above assessment and plan.    MEDICATIONS: Scheduled Meds: . aspirin EC  81 mg Oral Daily  . atorvastatin  20 mg Oral Daily  . docusate sodium  100 mg Oral BID  . enoxaparin (LOVENOX) injection  40 mg Subcutaneous Q24H  . fenofibrate  160 mg Oral Daily  . insulin aspart  0-9 Units Subcutaneous TID WC  . loratadine  10 mg Oral Daily  . polyethylene glycol  17 g Oral Daily   Continuous Infusions:  PRN Meds:.acetaminophen **OR** acetaminophen, cyclobenzaprine, hydrALAZINE, HYDROcodone-acetaminophen, HYDROmorphone (DILAUDID) injection, ondansetron **OR** ondansetron (ZOFRAN) IV,  pantoprazole  Antibiotics: Anti-infectives    None       PHYSICAL EXAM: Vital signs in last 24 hours: Filed Vitals:   11/02/14 1457 11/02/14 2149 11/03/14 0500 11/03/14 0555  BP: 97/50 117/58  128/66  Pulse: 90 92  89  Temp: 98.1 F (36.7 C) 99.3 F (37.4 C)  98.7 F (37.1 C)  TempSrc: Oral Oral    Resp: 21 21  20   Height:      Weight:   115.849 kg (255 lb 6.4 oz)   SpO2:  94%  94%    Weight change: 2.449 kg (5 lb 6.4 oz) Filed Weights   11/01/14 1833 11/02/14 0042 11/03/14 0500  Weight: 113.399 kg (250 lb) 114.7 kg (252 lb 13.9 oz) 115.849 kg (255 lb 6.4 oz)   Body mass index is 38.84 kg/(m^2).   Gen Exam: Awake and alert with clear speech.   Neck: Supple, No JVD.   Chest: B/L Clear.   CVS: S1 S2 Regular, no murmurs.  Abdomen: soft, BS +, non tender, non distended.  Extremities: no edema, lower extremities warm to touch. Neurologic: Non Focal.   Skin: No Rash.   Wounds: N/A.    Intake/Output from previous day:  Intake/Output Summary (Last 24 hours) at 11/03/14 1336 Last data filed at 11/03/14 0716  Gross per 24 hour  Intake   1175 ml  Output   1025 ml  Net    150 ml     LAB RESULTS: CBC  Recent Labs Lab 11/01/14 1844 11/01/14 2340 11/02/14 0311  WBC 9.0 7.9 9.0  HGB 8.2* 7.8* 8.3*  HCT 26.7* 25.2* 26.3*  PLT 239 223 235  MCV 87.3 85.1 85.7  MCH 26.8 26.4 27.0  MCHC 30.7  31.0 31.6  RDW 16.3* 16.1* 16.3*  LYMPHSABS 1.1  --  1.6  MONOABS 1.0  --  1.4*  EOSABS 0.1  --  0.2  BASOSABS 0.0  --  0.0    Chemistries   Recent Labs Lab 11/01/14 1844 11/01/14 2340 11/02/14 0311 11/03/14 0415  NA 140  --  137 139  K 3.8  --  3.6* 4.3  CL 100  --  96 100  CO2 22  --  23 24  GLUCOSE 36*  --  54* 242*  BUN 43*  --  40* 28*  CREATININE 2.85* 2.71* 2.49* 1.67*  CALCIUM 9.9  --  10.2 9.9    CBG:  Recent Labs Lab 11/03/14 0006 11/03/14 0415 11/03/14 0642 11/03/14 0751 11/03/14 1152  GLUCAP 210* 246* 263* 271* 250*    GFR Estimated  Creatinine Clearance: 56 mL/min (by C-G formula based on Cr of 1.67).  Coagulation profile No results for input(s): INR, PROTIME in the last 168 hours.  Cardiac Enzymes  Recent Labs Lab 11/01/14 2340  TROPONINI <0.30    Invalid input(s): POCBNP No results for input(s): DDIMER in the last 72 hours.  Recent Labs  11/01/14 2340  HGBA1C 6.3*   No results for input(s): CHOL, HDL, LDLCALC, TRIG, CHOLHDL, LDLDIRECT in the last 72 hours. No results for input(s): TSH, T4TOTAL, T3FREE, THYROIDAB in the last 72 hours.  Invalid input(s): FREET3 No results for input(s): VITAMINB12, FOLATE, FERRITIN, TIBC, IRON, RETICCTPCT in the last 72 hours. No results for input(s): LIPASE, AMYLASE in the last 72 hours.  Urine Studies No results for input(s): UHGB, CRYS in the last 72 hours.  Invalid input(s): UACOL, UAPR, USPG, UPH, UTP, UGL, UKET, UBIL, UNIT, UROB, ULEU, UEPI, UWBC, URBC, UBAC, CAST, UCOM, BILUA  MICROBIOLOGY: Recent Results (from the past 240 hour(s))  Culture, blood (routine x 2)     Status: None (Preliminary result)   Collection Time: 11/01/14  9:38 PM  Result Value Ref Range Status   Specimen Description BLOOD RIGHT HAND  Final   Special Requests BOTTLES DRAWN AEROBIC AND ANAEROBIC 5CC EACH  Final   Culture  Setup Time   Final    11/02/2014 05:14 Performed at [facility]    Culture   Final           BLOOD CULTURE RECEIVED NO GROWTH TO DATE CULTURE WILL BE HELD FOR 5 DAYS BEFORE ISSUING A FINAL NEGATIVE REPORT Performed at [facility]    Report Status PENDING  Incomplete  Culture, blood (routine x 2)     Status: None (Preliminary result)   Collection Time: 11/01/14  9:50 PM  Result Value Ref Range Status   Specimen Description BLOOD HAND LEFT  Final   Special Requests BOTTLES DRAWN AEROBIC AND ANAEROBIC 5CC  Final   Culture  Setup Time   Final    11/02/2014 05:16 Performed at [facility]    Culture   Final           BLOOD CULTURE  RECEIVED NO GROWTH TO DATE CULTURE WILL BE HELD FOR 5 DAYS BEFORE ISSUING A FINAL NEGATIVE REPORT Performed at [facility]    Report Status PENDING  Incomplete    RADIOLOGY STUDIES/RESULTS: Dg Chest 2 View  10/17/2014   CLINICAL DATA:  Preop for extension of lumbar fusion.  EXAM: CHEST  2 VIEW  COMPARISON:  None.  FINDINGS: The heart is mildly enlarged. The lungs are clear. No focal airspace disease is evident. Degenerative  changes are noted in the thoracic spine.  IMPRESSION: 1. Borderline cardiomegaly without failure. 2. No acute cardiopulmonary disease.   Electronically Signed   By: Lawrence Santiago M.D.   On: 10/17/2014 09:49   Dg Lumbar Spine 2-3 Views  10/27/2014   CLINICAL DATA:  Extended fusion L3-4, L4-5  EXAM: DG C-ARM GT 120 MIN; LUMBAR SPINE - 2-3 VIEW  : COMPARISON:  09/29/2014  FINDINGS: Limited field of view. Interbody spacer at L5-S1. Pedicle screws have been removed from L5. There are pedicle screws at L3, L4, L5 bilaterally with posterior connecting rods. Interbody spacers at L3-4 and L4-5.  Followup radiographs are suggested to confirm levels.  IMPRESSION: Pedicle screw interbody fusion L3-4 and L4-5.   Electronically Signed   By: Franchot Gallo M.D.   On: 10/27/2014 16:05   Dg Chest Port 1 View  11/01/2014   CLINICAL DATA:  Cough.  Fever.  EXAM: PORTABLE CHEST - 1 VIEW  COMPARISON:  10/17/2014.  FINDINGS: Cardiomegaly. Monitoring leads project over the chest. No airspace disease. Mild basilar atelectasis. No effusion. Aortic arch atherosclerosis.  IMPRESSION: Cardiomegaly without failure.   Electronically Signed   By: Dereck Ligas M.D.   On: 11/01/2014 22:32   Dg Abd Portable 1v  11/02/2014   CLINICAL DATA:  Constipation.  Two good BM's today  EXAM: PORTABLE ABDOMEN - 1 VIEW  COMPARISON:  None.  FINDINGS: Posterior lumbar fusion noted. There are no dilated loops of large or small bowel. No significant stool burden.  IMPRESSION: Normal bowel-gas pattern.    Electronically Signed   By: Suzy Bouchard M.D.   On: 11/02/2014 09:55   Dg C-arm Gt 120 Min  10/27/2014   CLINICAL DATA:  Extended fusion L3-4, L4-5  EXAM: DG C-ARM GT 120 MIN; LUMBAR SPINE - 2-3 VIEW  : COMPARISON:  09/29/2014  FINDINGS: Limited field of view. Interbody spacer at L5-S1. Pedicle screws have been removed from L5. There are pedicle screws at L3, L4, L5 bilaterally with posterior connecting rods. Interbody spacers at L3-4 and L4-5.  Followup radiographs are suggested to confirm levels.  IMPRESSION: Pedicle screw interbody fusion L3-4 and L4-5.   Electronically Signed   By: Franchot Gallo M.D.   On: 10/27/2014 16:05    Oren Binet, MD  [facility] Pager:[phone]  If 7PM-7AM, please contact night-coverage [email or web address] Password [email or web address] 11/03/2014, 1:36 PM   LOS: 2 days

## 2014-11-03 NOTE — Discharge Planning (Signed)
Instructed pt on insulin administration, including administration sites, importance of rotating administration sites, remaining at least 1 inch away from umbicus, and needle use and disposal. Will continue to reinforce teaching.

## 2014-11-03 NOTE — Progress Notes (Signed)
Results for JURIAH, LANAHAN (MRN [id number]) as of 11/03/2014 12:52  Ref. Range 11/03/2014 00:06 11/03/2014 04:15 11/03/2014 06:42 11/03/2014 07:51 11/03/2014 11:52  Glucose-Capillary Latest Range: 70-99 mg/dL 210 (H) 246 (H) 263 (H) 271 (H) 250 (H)  CBGs continue to be greater than 180 mg/dl.  Recommend starting Lantus 15 -20 units daily along with Novolog SENSITIVE or MODERATE correction scale TID & HS if CBGs continue to be elevated.  Will continue to follow while in hospital. Harvel Ricks RN BSN CDE

## 2014-11-03 NOTE — Progress Notes (Signed)
Pt a/o c/o pain, PRN norco given as ordered, pt voiding adequately, pt resting comfortably, pt stable

## 2014-11-04 LAB — GLUCOSE, CAPILLARY
GLUCOSE-CAPILLARY: 234 mg/dL — AB (ref 70–99)
GLUCOSE-CAPILLARY: 254 mg/dL — AB (ref 70–99)
Glucose-Capillary: 220 mg/dL — ABNORMAL HIGH (ref 70–99)

## 2014-11-04 LAB — BASIC METABOLIC PANEL
Anion gap: 15 (ref 5–15)
BUN: 21 mg/dL (ref 6–23)
CALCIUM: 9.9 mg/dL (ref 8.4–10.5)
CO2: 23 meq/L (ref 19–32)
Chloride: 100 mEq/L (ref 96–112)
Creatinine, Ser: 1.24 mg/dL (ref 0.50–1.35)
GFR calc Af Amer: 70 mL/min — ABNORMAL LOW (ref >90)
GFR calc non Af Amer: 60 mL/min — ABNORMAL LOW (ref >90)
GLUCOSE: 238 mg/dL — AB (ref 70–99)
Potassium: 3.9 mEq/L (ref 3.7–5.3)
SODIUM: 138 meq/L (ref 137–147)

## 2014-11-04 MED ORDER — METFORMIN HCL 500 MG PO TABS: 1000.0000 mg | ORAL_TABLET | Freq: Two times a day (BID) | ORAL | Status: DC

## 2014-11-04 MED ORDER — PIOGLITAZONE HCL 30 MG PO TABS: 30.0000 mg | ORAL_TABLET | Freq: Every day | ORAL | Status: DC

## 2014-11-04 MED ORDER — AMLODIPINE BESYLATE 5 MG PO TABS
10.0000 mg | ORAL_TABLET | Freq: Every day | ORAL | Status: DC
Start: 2014-11-04 — End: 2017-08-04

## 2014-11-04 MED ORDER — METFORMIN HCL 500 MG PO TABS
1000.0000 mg | ORAL_TABLET | Freq: Two times a day (BID) | ORAL | Status: DC
  Administered 2014-11-04: 1000 mg via ORAL

## 2014-11-04 MED ORDER — METFORMIN HCL 1000 MG PO TABS: 1000.0000 mg | ORAL_TABLET | Freq: Two times a day (BID) | ORAL | Status: DC

## 2014-11-04 NOTE — Progress Notes (Signed)
Discharge instructions given. Pt verbalized understanding and all questions were answered.  

## 2014-11-04 NOTE — Discharge Summary (Signed)
PATIENT DETAILS Name: Erik Martin Age: 63 y.o. Sex: male Date of Birth: Oct 15, 1951 MRN: [id number]. Admitting Physician: Rise Patience, MD ASN:[id number],HALPF L, MD  Admit Date: 11/01/2014 Discharge date: 11/04/2014  Recommendations for Outpatient Follow-up:  1. Repeat CBC and BMET in 1 week 2. Note-Amaryl, lisinopril and HCTZ currently on hold- defer to PCP whether or not to resume in the future.  PRIMARY DISCHARGE DIAGNOSIS:  Active Problems:   Hypoglycemia   Acute renal failure   Diabetes mellitus type 2, uncontrolled   Anemia, blood loss   Hypertension   Hyperlipidemia   Anemia      PAST MEDICAL HISTORY: Past Medical History  Diagnosis Date  . Hypertension   . Diabetes mellitus without complication   . Depression   . GERD (gastroesophageal reflux disease)   . Arthritis   . Lumbar pain   . Cervical radiculopathy   . Hypomagnesemia   . B12 deficiency   . Hyperlipidemia     DISCHARGE MEDICATIONS: Current Discharge Medication List    START taking these medications   Details  metFORMIN (GLUCOPHAGE) 1000 MG tablet Take 1 tablet (1,000 mg total) by mouth 2 (two) times daily with a meal. Qty: 60 tablet, Refills: 0    pioglitazone (ACTOS) 30 MG tablet Take 1 tablet (30 mg total) by mouth daily. Qty: 30 tablet, Refills: 0      CONTINUE these medications which have CHANGED   Details  amLODipine (NORVASC) 5 MG tablet Take 2 tablets (10 mg total) by mouth daily. Qty: 60 tablet, Refills: 0      CONTINUE these medications which have NOT CHANGED   Details  aspirin EC 81 MG tablet Take 81 mg by mouth daily.    atorvastatin (LIPITOR) 20 MG tablet Take 20 mg by mouth daily.    cetirizine (ZYRTEC) 10 MG tablet Take 10 mg by mouth daily as needed for allergies.    Coenzyme Q10 (CO Q-10 PO) Take 200 mg by mouth daily.    cyclobenzaprine (FLEXERIL) 10 MG tablet Take 1 tablet (10 mg total) by mouth 3 (three) times daily as needed for muscle spasms. Qty: 80  tablet, Refills: 0    fenofibrate micronized (LOFIBRA) 134 MG capsule Take 134 mg by mouth daily before breakfast.    HYDROcodone-acetaminophen (NORCO) 10-325 MG per tablet Take 1 tablet by mouth 4 (four) times daily as needed (pain).    hydrocortisone cream 1 % Apply 1 application topically 2 (two) times daily.    HYDROmorphone (DILAUDID) 4 MG tablet Take 1 tablet (4 mg total) by mouth every 3 (three) hours as needed for severe pain. Qty: 60 tablet, Refills: 0    magnesium oxide (MAG-OX) 400 MG tablet Take 400 mg by mouth daily.    pantoprazole (PROTONIX) 40 MG tablet Take 40 mg by mouth daily as needed (acid reflux).    vitamin B-12 (CYANOCOBALAMIN) 1000 MCG tablet Take 2,000 mcg by mouth daily.      STOP taking these medications     glimepiride (AMARYL) 4 MG tablet      hydrochlorothiazide (HYDRODIURIL) 25 MG tablet      lisinopril (PRINIVIL,ZESTRIL) 20 MG tablet      pioglitazone-metformin (ACTOPLUS MET) 15-850 MG per tablet         ALLERGIES:   Allergies  Allergen Reactions  . Niaspan [Niacin] Other (See Comments)    Flushing, burning    BRIEF HPI:  See H&P, Labs, Consult and Test reports for all details in brief, patient Erik Martin is  a 63 y.o. male with history of diabetes mellitus, hypertension, hyperlipidemia who has had a recent lumbar fusion surgery 4 days prior to this admission was brought to the ER the patient's family noted that patient has been having recurrent episodes of hypoglycemia with symptoms  CONSULTATIONS:   None  PERTINENT RADIOLOGIC STUDIES: Dg Chest 2 View  10/17/2014   CLINICAL DATA:  Preop for extension of lumbar fusion.  EXAM: CHEST  2 VIEW  COMPARISON:  None.  FINDINGS: The heart is mildly enlarged. The lungs are clear. No focal airspace disease is evident. Degenerative changes are noted in the thoracic spine.  IMPRESSION: 1. Borderline cardiomegaly without failure. 2. No acute cardiopulmonary disease.   Electronically Signed   By: Lawrence Santiago M.D.   On: 10/17/2014 09:49   Dg Lumbar Spine 2-3 Views  10/27/2014   CLINICAL DATA:  Extended fusion L3-4, L4-5  EXAM: DG C-ARM GT 120 MIN; LUMBAR SPINE - 2-3 VIEW  : COMPARISON:  09/29/2014  FINDINGS: Limited field of view. Interbody spacer at L5-S1. Pedicle screws have been removed from L5. There are pedicle screws at L3, L4, L5 bilaterally with posterior connecting rods. Interbody spacers at L3-4 and L4-5.  Followup radiographs are suggested to confirm levels.  IMPRESSION: Pedicle screw interbody fusion L3-4 and L4-5.   Electronically Signed   By: Franchot Gallo M.D.   On: 10/27/2014 16:05   Dg Chest Port 1 View  11/01/2014   CLINICAL DATA:  Cough.  Fever.  EXAM: PORTABLE CHEST - 1 VIEW  COMPARISON:  10/17/2014.  FINDINGS: Cardiomegaly. Monitoring leads project over the chest. No airspace disease. Mild basilar atelectasis. No effusion. Aortic arch atherosclerosis.  IMPRESSION: Cardiomegaly without failure.   Electronically Signed   By: Dereck Ligas M.D.   On: 11/01/2014 22:32   Dg Abd Portable 1v  11/02/2014   CLINICAL DATA:  Constipation.  Two good BM's today  EXAM: PORTABLE ABDOMEN - 1 VIEW  COMPARISON:  None.  FINDINGS: Posterior lumbar fusion noted. There are no dilated loops of large or small bowel. No significant stool burden.  IMPRESSION: Normal bowel-gas pattern.   Electronically Signed   By: Suzy Bouchard M.D.   On: 11/02/2014 09:55   Dg C-arm Gt 120 Min  10/27/2014   CLINICAL DATA:  Extended fusion L3-4, L4-5  EXAM: DG C-ARM GT 120 MIN; LUMBAR SPINE - 2-3 VIEW  : COMPARISON:  09/29/2014  FINDINGS: Limited field of view. Interbody spacer at L5-S1. Pedicle screws have been removed from L5. There are pedicle screws at L3, L4, L5 bilaterally with posterior connecting rods. Interbody spacers at L3-4 and L4-5.  Followup radiographs are suggested to confirm levels.  IMPRESSION: Pedicle screw interbody fusion L3-4 and L4-5.   Electronically Signed   By: Franchot Gallo M.D.   On:  10/27/2014 16:05     PERTINENT LAB RESULTS: CBC:  Recent Labs  11/02/14 0311 11/03/14 1415  WBC 9.0 7.2  HGB 8.3* 8.4*  HCT 26.3* 27.5*  PLT 235 256   CMET CMP     Component Value Date/Time   NA 138 11/04/2014 0448   K 3.9 11/04/2014 0448   CL 100 11/04/2014 0448   CO2 23 11/04/2014 0448   GLUCOSE 238* 11/04/2014 0448   BUN 21 11/04/2014 0448   CREATININE 1.24 11/04/2014 0448   CALCIUM 9.9 11/04/2014 0448   PROT 7.0 11/02/2014 0311   ALBUMIN 3.1* 11/02/2014 0311   AST 51* 11/02/2014 0311   ALT 33 11/02/2014 0311  ALKPHOS 46 11/02/2014 0311   BILITOT 0.4 11/02/2014 0311   GFRNONAA 60* 11/04/2014 0448   GFRAA 70* 11/04/2014 0448    GFR Estimated Creatinine Clearance: 75.4 mL/min (by C-G formula based on Cr of 1.24). No results for input(s): LIPASE, AMYLASE in the last 72 hours.  Recent Labs  11/01/14 2340  TROPONINI <0.30   Invalid input(s): POCBNP No results for input(s): DDIMER in the last 72 hours.  Recent Labs  11/01/14 2340  HGBA1C 6.3*   No results for input(s): CHOL, HDL, LDLCALC, TRIG, CHOLHDL, LDLDIRECT in the last 72 hours. No results for input(s): TSH, T4TOTAL, T3FREE, THYROIDAB in the last 72 hours.  Invalid input(s): FREET3 No results for input(s): VITAMINB12, FOLATE, FERRITIN, TIBC, IRON, RETICCTPCT in the last 72 hours. Coags: No results for input(s): INR in the last 72 hours.  Invalid input(s): PT Microbiology: Recent Results (from the past 240 hour(s))  Culture, blood (routine x 2)     Status: None (Preliminary result)   Collection Time: 11/01/14  9:38 PM  Result Value Ref Range Status   Specimen Description BLOOD RIGHT HAND  Final   Special Requests BOTTLES DRAWN AEROBIC AND ANAEROBIC 5CC EACH  Final   Culture  Setup Time   Final    11/02/2014 05:14 Performed at [facility]    Culture   Final           BLOOD CULTURE RECEIVED NO GROWTH TO DATE CULTURE WILL BE HELD FOR 5 DAYS BEFORE ISSUING A FINAL NEGATIVE  REPORT Performed at [facility]    Report Status PENDING  Incomplete  Culture, blood (routine x 2)     Status: None (Preliminary result)   Collection Time: 11/01/14  9:50 PM  Result Value Ref Range Status   Specimen Description BLOOD HAND LEFT  Final   Special Requests BOTTLES DRAWN AEROBIC AND ANAEROBIC 5CC  Final   Culture  Setup Time   Final    11/02/2014 05:16 Performed at [facility]    Culture   Final           BLOOD CULTURE RECEIVED NO GROWTH TO DATE CULTURE WILL BE HELD FOR 5 DAYS BEFORE ISSUING A FINAL NEGATIVE REPORT Performed at [facility]    Report Status PENDING  Incomplete     BRIEF HOSPITAL COURSE:    Hypoglycemia:secondary to development of ARF and use of Amaryl. Admitted, provided supportive care. Hypoglycemia has resolved.Restarted on metformin and Actos, Amaryl continues to be on hold.   Acute renal failure:pre-renal-on ACEI/Diuretics/perioperative blood loss.creatinine normalized with IV fluids. We will continue to hold lisinopril and HCTZ at discharge. Patient will follow-up with his primary care practitioner to decide if these medications can be resumed in the future.   Diabetes mellitus type 2, uncontrolled:on 3 oral agents prior to admission.initially hypoglycemic, however sugars now mostly in the low 200s range. Will restart metformin at 1000 mg twice a day as renal function now back to normal, has increased Actos to 30 mg daily. We'll continue to hold Amaryl on discharge. Will defer further optimization and titration of medications to primary care practitioner.   Anemia:suspect secondary to IVF, ?peri-operative blood loss. Monitor Hb. FOBT neg. No other cause of acute drop in Hb apparent.please repeat CBC in 1 week.   Hypertension:BP currently controlled without use of anti-hypertensives while inpatient. Will restart amlodipine at 10 mg on discharge, will continue to hold lisinopril and HCTZ on discharge. Defer further  titration/adjustment of medications to primary care practitioner  Hyperlipidemia:c/w Lipitor and fenofibrate   TODAY-DAY OF DISCHARGE:  Subjective:   [place] Wiseman today has no headache,no chest abdominal pain,no new weakness tingling or numbness, feels much better wants to go home today.   Objective:   Blood pressure 143/70, pulse 86, temperature 98.6 F (37 C), temperature source Oral, resp. rate 16, height $RemoveBe'5\' 8"'HEyhVNQtC$  (1.727 m), weight 115.849 kg (255 lb 6.4 oz), SpO2 97 %.  Intake/Output Summary (Last 24 hours) at 11/04/14 1215 Last data filed at 11/03/14 1825  Gross per 24 hour  Intake    720 ml  Output      0 ml  Net    720 ml   Filed Weights   11/01/14 1833 11/02/14 0042 11/03/14 0500  Weight: 113.399 kg (250 lb) 114.7 kg (252 lb 13.9 oz) 115.849 kg (255 lb 6.4 oz)    Exam Awake Alert, Oriented *3, No new F.N deficits, Normal affect [place].AT,PERRAL Supple Neck,No JVD, No cervical lymphadenopathy appriciated.  Symmetrical Chest wall movement, Good air movement bilaterally, CTAB RRR,No Gallops,Rubs or new Murmurs, No Parasternal Heave +ve B.Sounds, Abd Soft, Non tender, No organomegaly appriciated, No rebound -guarding or rigidity. No Cyanosis, Clubbing or edema, No new Rash or bruise  DISCHARGE CONDITION: Stable  DISPOSITION: Home  DISCHARGE INSTRUCTIONS:    Activity:  As tolerated   Diet recommendation: Diabetic Diet Heart Healthy diet  Discharge Instructions    Call MD for:  persistant nausea and vomiting    Complete by:  As directed      Call MD for:    Complete by:  As directed   For low blood sugars     Diet - low sodium heart healthy    Complete by:  As directed      Diet general    Complete by:  As directed      Increase activity slowly    Complete by:  As directed            Follow-up Information    Follow up with [facility], MD. Schedule an appointment as soon as possible for a visit in 1 week.   Specialty:  Family Medicine   Contact  information:   Dr. Daiva Eves [address] [place] [address] [phone]       Follow up with Faythe Ghee, MD.   Specialty:  Neurosurgery   Why:  keep next appt   Contact information:   [address] [place] [address] [phone]         Total Time spent on discharge equals 45 minutes.  SignedOren Binet 11/04/2014 12:15 PM

## 2014-11-04 NOTE — Care Management Note (Signed)
  Page 1 of 1   11/04/2014     8:34:37 AM CARE MANAGEMENT NOTE 11/04/2014  Patient:  Erik Martin, Erik Martin   Account Number:  [id number]  Date Initiated:  11/04/2014  Documentation initiated by:  Magdalen Spatz  Subjective/Objective Assessment:     Action/Plan:   Anticipated DC Date:     Anticipated DC Plan:           Choice offered to / List presented to:             Status of service:   Medicare Important Message given?  YES (If response is "NO", the following Medicare IM given date fields will be blank) Date Medicare IM given:  11/04/2014 Medicare IM given by:  Magdalen Spatz Date Additional Medicare IM given:   Additional Medicare IM given by:    Discharge Disposition:    Per UR Regulation:    If discussed at Long Length of Stay Meetings, dates discussed:    Comments:

## 2014-11-08 LAB — CULTURE, BLOOD (ROUTINE X 2)
CULTURE: NO GROWTH
CULTURE: NO GROWTH

## 2014-11-10 ENCOUNTER — Other Ambulatory Visit: Payer: Self-pay | Admitting: Internal Medicine

## 2014-11-15 ENCOUNTER — Other Ambulatory Visit: Payer: Self-pay | Admitting: Internal Medicine

## 2016-01-23 DIAGNOSIS — M791 Myalgia: Secondary | ICD-10-CM | POA: Diagnosis not present

## 2016-01-23 DIAGNOSIS — M549 Dorsalgia, unspecified: Secondary | ICD-10-CM | POA: Diagnosis not present

## 2016-01-23 DIAGNOSIS — Z6838 Body mass index (BMI) 38.0-38.9, adult: Secondary | ICD-10-CM | POA: Diagnosis not present

## 2016-01-23 DIAGNOSIS — M25562 Pain in left knee: Secondary | ICD-10-CM | POA: Diagnosis not present

## 2016-03-25 DIAGNOSIS — E1140 Type 2 diabetes mellitus with diabetic neuropathy, unspecified: Secondary | ICD-10-CM | POA: Diagnosis not present

## 2016-03-25 DIAGNOSIS — E538 Deficiency of other specified B group vitamins: Secondary | ICD-10-CM | POA: Diagnosis not present

## 2016-03-25 DIAGNOSIS — E782 Mixed hyperlipidemia: Secondary | ICD-10-CM | POA: Diagnosis not present

## 2016-03-27 DIAGNOSIS — E1140 Type 2 diabetes mellitus with diabetic neuropathy, unspecified: Secondary | ICD-10-CM | POA: Diagnosis not present

## 2016-03-27 DIAGNOSIS — E782 Mixed hyperlipidemia: Secondary | ICD-10-CM | POA: Diagnosis not present

## 2016-03-27 DIAGNOSIS — Z23 Encounter for immunization: Secondary | ICD-10-CM | POA: Diagnosis not present

## 2016-03-27 DIAGNOSIS — R911 Solitary pulmonary nodule: Secondary | ICD-10-CM | POA: Diagnosis not present

## 2016-03-27 DIAGNOSIS — Z79899 Other long term (current) drug therapy: Secondary | ICD-10-CM | POA: Diagnosis not present

## 2016-03-27 DIAGNOSIS — I10 Essential (primary) hypertension: Secondary | ICD-10-CM | POA: Diagnosis not present

## 2016-03-27 DIAGNOSIS — N182 Chronic kidney disease, stage 2 (mild): Secondary | ICD-10-CM | POA: Diagnosis not present

## 2016-03-27 DIAGNOSIS — E669 Obesity, unspecified: Secondary | ICD-10-CM | POA: Diagnosis not present

## 2016-03-27 DIAGNOSIS — D509 Iron deficiency anemia, unspecified: Secondary | ICD-10-CM | POA: Diagnosis not present

## 2016-03-27 DIAGNOSIS — S61419A Laceration without foreign body of unspecified hand, initial encounter: Secondary | ICD-10-CM | POA: Diagnosis not present

## 2016-03-27 DIAGNOSIS — E538 Deficiency of other specified B group vitamins: Secondary | ICD-10-CM | POA: Diagnosis not present

## 2016-04-01 DIAGNOSIS — J42 Unspecified chronic bronchitis: Secondary | ICD-10-CM | POA: Diagnosis not present

## 2016-04-01 DIAGNOSIS — I2510 Atherosclerotic heart disease of native coronary artery without angina pectoris: Secondary | ICD-10-CM | POA: Diagnosis not present

## 2016-04-01 DIAGNOSIS — R911 Solitary pulmonary nodule: Secondary | ICD-10-CM | POA: Diagnosis not present

## 2016-04-23 DIAGNOSIS — M1712 Unilateral primary osteoarthritis, left knee: Secondary | ICD-10-CM | POA: Diagnosis not present

## 2016-05-24 DIAGNOSIS — M1712 Unilateral primary osteoarthritis, left knee: Secondary | ICD-10-CM | POA: Diagnosis not present

## 2016-05-29 DIAGNOSIS — S83242A Other tear of medial meniscus, current injury, left knee, initial encounter: Secondary | ICD-10-CM | POA: Diagnosis not present

## 2016-05-29 DIAGNOSIS — M25462 Effusion, left knee: Secondary | ICD-10-CM | POA: Diagnosis not present

## 2016-05-29 DIAGNOSIS — M1712 Unilateral primary osteoarthritis, left knee: Secondary | ICD-10-CM | POA: Diagnosis not present

## 2016-05-29 DIAGNOSIS — X58XXXA Exposure to other specified factors, initial encounter: Secondary | ICD-10-CM | POA: Diagnosis not present

## 2016-06-03 DIAGNOSIS — M1712 Unilateral primary osteoarthritis, left knee: Secondary | ICD-10-CM | POA: Diagnosis not present

## 2016-06-03 DIAGNOSIS — S83232A Complex tear of medial meniscus, current injury, left knee, initial encounter: Secondary | ICD-10-CM | POA: Diagnosis not present

## 2016-06-27 DIAGNOSIS — M791 Myalgia: Secondary | ICD-10-CM | POA: Diagnosis not present

## 2016-06-27 DIAGNOSIS — M545 Low back pain: Secondary | ICD-10-CM | POA: Diagnosis not present

## 2016-06-27 DIAGNOSIS — M5416 Radiculopathy, lumbar region: Secondary | ICD-10-CM | POA: Diagnosis not present

## 2016-06-27 DIAGNOSIS — D692 Other nonthrombocytopenic purpura: Secondary | ICD-10-CM | POA: Diagnosis not present

## 2016-06-27 DIAGNOSIS — F1190 Opioid use, unspecified, uncomplicated: Secondary | ICD-10-CM | POA: Diagnosis not present

## 2016-06-27 DIAGNOSIS — Z6839 Body mass index (BMI) 39.0-39.9, adult: Secondary | ICD-10-CM | POA: Diagnosis not present

## 2016-06-27 DIAGNOSIS — E1140 Type 2 diabetes mellitus with diabetic neuropathy, unspecified: Secondary | ICD-10-CM | POA: Diagnosis not present

## 2016-06-27 DIAGNOSIS — D509 Iron deficiency anemia, unspecified: Secondary | ICD-10-CM | POA: Diagnosis not present

## 2016-06-27 DIAGNOSIS — Z79899 Other long term (current) drug therapy: Secondary | ICD-10-CM | POA: Diagnosis not present

## 2016-08-20 DIAGNOSIS — Z6838 Body mass index (BMI) 38.0-38.9, adult: Secondary | ICD-10-CM | POA: Diagnosis not present

## 2016-08-20 DIAGNOSIS — M545 Low back pain: Secondary | ICD-10-CM | POA: Diagnosis not present

## 2016-08-20 DIAGNOSIS — I10 Essential (primary) hypertension: Secondary | ICD-10-CM | POA: Diagnosis not present

## 2016-09-05 ENCOUNTER — Other Ambulatory Visit: Payer: Self-pay | Admitting: Neurosurgery

## 2016-09-05 DIAGNOSIS — M545 Low back pain: Principal | ICD-10-CM

## 2016-09-05 DIAGNOSIS — G8929 Other chronic pain: Secondary | ICD-10-CM

## 2016-09-06 DIAGNOSIS — E119 Type 2 diabetes mellitus without complications: Secondary | ICD-10-CM | POA: Diagnosis not present

## 2016-09-06 DIAGNOSIS — H25813 Combined forms of age-related cataract, bilateral: Secondary | ICD-10-CM | POA: Diagnosis not present

## 2016-09-06 DIAGNOSIS — H524 Presbyopia: Secondary | ICD-10-CM | POA: Diagnosis not present

## 2016-09-06 DIAGNOSIS — H52223 Regular astigmatism, bilateral: Secondary | ICD-10-CM | POA: Diagnosis not present

## 2016-09-06 DIAGNOSIS — Z7984 Long term (current) use of oral hypoglycemic drugs: Secondary | ICD-10-CM | POA: Diagnosis not present

## 2016-09-06 DIAGNOSIS — H5203 Hypermetropia, bilateral: Secondary | ICD-10-CM | POA: Diagnosis not present

## 2016-09-06 DIAGNOSIS — H43313 Vitreous membranes and strands, bilateral: Secondary | ICD-10-CM | POA: Diagnosis not present

## 2016-09-06 DIAGNOSIS — H353111 Nonexudative age-related macular degeneration, right eye, early dry stage: Secondary | ICD-10-CM | POA: Diagnosis not present

## 2016-09-06 DIAGNOSIS — H43813 Vitreous degeneration, bilateral: Secondary | ICD-10-CM | POA: Diagnosis not present

## 2016-09-06 DIAGNOSIS — H43393 Other vitreous opacities, bilateral: Secondary | ICD-10-CM | POA: Diagnosis not present

## 2016-09-06 DIAGNOSIS — I10 Essential (primary) hypertension: Secondary | ICD-10-CM | POA: Diagnosis not present

## 2016-09-06 DIAGNOSIS — H35373 Puckering of macula, bilateral: Secondary | ICD-10-CM | POA: Diagnosis not present

## 2016-09-17 ENCOUNTER — Ambulatory Visit
Admission: RE | Admit: 2016-09-17 | Discharge: 2016-09-17 | Disposition: A | Discharge status: Home / Self Care | Payer: PPO | Source: Ambulatory Visit | Attending: Neurosurgery | Admitting: Neurosurgery

## 2016-09-17 VITALS — BP 131/60 | HR 72

## 2016-09-17 DIAGNOSIS — M4806 Spinal stenosis, lumbar region: Secondary | ICD-10-CM | POA: Diagnosis not present

## 2016-09-17 DIAGNOSIS — G8929 Other chronic pain: Secondary | ICD-10-CM

## 2016-09-17 DIAGNOSIS — M545 Low back pain, unspecified: Secondary | ICD-10-CM

## 2016-09-17 DIAGNOSIS — M48061 Spinal stenosis, lumbar region without neurogenic claudication: Secondary | ICD-10-CM

## 2016-09-17 MED ORDER — DIAZEPAM 5 MG PO TABS
5.0000 mg | ORAL_TABLET | Freq: Once | ORAL | Status: AC
  Administered 2016-09-17: 5 mg via ORAL

## 2016-09-17 MED ORDER — ONDANSETRON HCL 4 MG/2ML IJ SOLN: 4.0000 mg | Freq: Four times a day (QID) | INTRAMUSCULAR | Status: DC | PRN

## 2016-09-17 MED ORDER — IOPAMIDOL (ISOVUE-M 200) INJECTION 41%
15.0000 mL | Freq: Once | INTRAMUSCULAR | Status: AC
  Administered 2016-09-17: 15 mL via INTRATHECAL

## 2016-09-17 NOTE — Discharge Instructions (Signed)

## 2016-09-26 DIAGNOSIS — Z125 Encounter for screening for malignant neoplasm of prostate: Secondary | ICD-10-CM | POA: Diagnosis not present

## 2016-09-26 DIAGNOSIS — M545 Low back pain: Secondary | ICD-10-CM | POA: Diagnosis not present

## 2016-09-26 DIAGNOSIS — Z1389 Encounter for screening for other disorder: Secondary | ICD-10-CM | POA: Diagnosis not present

## 2016-09-26 DIAGNOSIS — Z6837 Body mass index (BMI) 37.0-37.9, adult: Secondary | ICD-10-CM | POA: Diagnosis not present

## 2016-09-26 DIAGNOSIS — E1140 Type 2 diabetes mellitus with diabetic neuropathy, unspecified: Secondary | ICD-10-CM | POA: Diagnosis not present

## 2016-09-26 DIAGNOSIS — M5416 Radiculopathy, lumbar region: Secondary | ICD-10-CM | POA: Diagnosis not present

## 2016-09-26 DIAGNOSIS — Z6839 Body mass index (BMI) 39.0-39.9, adult: Secondary | ICD-10-CM | POA: Diagnosis not present

## 2016-09-26 DIAGNOSIS — Z79899 Other long term (current) drug therapy: Secondary | ICD-10-CM | POA: Diagnosis not present

## 2016-09-26 DIAGNOSIS — I10 Essential (primary) hypertension: Secondary | ICD-10-CM | POA: Diagnosis not present

## 2016-09-26 DIAGNOSIS — E782 Mixed hyperlipidemia: Secondary | ICD-10-CM | POA: Diagnosis not present

## 2016-09-26 DIAGNOSIS — D509 Iron deficiency anemia, unspecified: Secondary | ICD-10-CM | POA: Diagnosis not present

## 2016-09-26 DIAGNOSIS — Z23 Encounter for immunization: Secondary | ICD-10-CM | POA: Diagnosis not present

## 2016-09-26 DIAGNOSIS — M4806 Spinal stenosis, lumbar region: Secondary | ICD-10-CM | POA: Diagnosis not present

## 2016-10-15 DIAGNOSIS — S83232A Complex tear of medial meniscus, current injury, left knee, initial encounter: Secondary | ICD-10-CM | POA: Diagnosis not present

## 2016-10-21 DIAGNOSIS — E669 Obesity, unspecified: Secondary | ICD-10-CM | POA: Diagnosis not present

## 2016-10-21 DIAGNOSIS — Z6839 Body mass index (BMI) 39.0-39.9, adult: Secondary | ICD-10-CM | POA: Diagnosis not present

## 2016-10-21 DIAGNOSIS — Z720 Tobacco use: Secondary | ICD-10-CM | POA: Diagnosis not present

## 2016-10-21 DIAGNOSIS — Z01818 Encounter for other preprocedural examination: Secondary | ICD-10-CM | POA: Diagnosis not present

## 2016-10-21 DIAGNOSIS — S83232S Complex tear of medial meniscus, current injury, left knee, sequela: Secondary | ICD-10-CM | POA: Diagnosis not present

## 2016-10-30 DIAGNOSIS — X58XXXA Exposure to other specified factors, initial encounter: Secondary | ICD-10-CM | POA: Diagnosis not present

## 2016-10-30 DIAGNOSIS — M1712 Unilateral primary osteoarthritis, left knee: Secondary | ICD-10-CM | POA: Diagnosis not present

## 2016-10-30 DIAGNOSIS — I129 Hypertensive chronic kidney disease with stage 1 through stage 4 chronic kidney disease, or unspecified chronic kidney disease: Secondary | ICD-10-CM | POA: Diagnosis not present

## 2016-10-30 DIAGNOSIS — M109 Gout, unspecified: Secondary | ICD-10-CM | POA: Diagnosis not present

## 2016-10-30 DIAGNOSIS — S83231A Complex tear of medial meniscus, current injury, right knee, initial encounter: Secondary | ICD-10-CM | POA: Diagnosis not present

## 2016-10-30 DIAGNOSIS — S83232A Complex tear of medial meniscus, current injury, left knee, initial encounter: Secondary | ICD-10-CM | POA: Diagnosis not present

## 2016-10-30 DIAGNOSIS — E119 Type 2 diabetes mellitus without complications: Secondary | ICD-10-CM | POA: Diagnosis not present

## 2016-10-30 DIAGNOSIS — J45909 Unspecified asthma, uncomplicated: Secondary | ICD-10-CM | POA: Diagnosis not present

## 2016-10-30 DIAGNOSIS — Z7984 Long term (current) use of oral hypoglycemic drugs: Secondary | ICD-10-CM | POA: Diagnosis not present

## 2016-10-30 DIAGNOSIS — Z79899 Other long term (current) drug therapy: Secondary | ICD-10-CM | POA: Diagnosis not present

## 2016-10-30 DIAGNOSIS — E785 Hyperlipidemia, unspecified: Secondary | ICD-10-CM | POA: Diagnosis not present

## 2016-10-30 DIAGNOSIS — F17210 Nicotine dependence, cigarettes, uncomplicated: Secondary | ICD-10-CM | POA: Diagnosis not present

## 2016-10-30 DIAGNOSIS — M23322 Other meniscus derangements, posterior horn of medial meniscus, left knee: Secondary | ICD-10-CM | POA: Diagnosis not present

## 2016-10-30 DIAGNOSIS — K219 Gastro-esophageal reflux disease without esophagitis: Secondary | ICD-10-CM | POA: Diagnosis not present

## 2016-10-30 DIAGNOSIS — N182 Chronic kidney disease, stage 2 (mild): Secondary | ICD-10-CM | POA: Diagnosis not present

## 2016-11-06 DIAGNOSIS — M25562 Pain in left knee: Secondary | ICD-10-CM | POA: Diagnosis not present

## 2016-11-06 DIAGNOSIS — M6281 Muscle weakness (generalized): Secondary | ICD-10-CM | POA: Diagnosis not present

## 2016-11-06 DIAGNOSIS — R2689 Other abnormalities of gait and mobility: Secondary | ICD-10-CM | POA: Diagnosis not present

## 2016-11-06 DIAGNOSIS — M1712 Unilateral primary osteoarthritis, left knee: Secondary | ICD-10-CM | POA: Diagnosis not present

## 2016-11-06 DIAGNOSIS — S83232D Complex tear of medial meniscus, current injury, left knee, subsequent encounter: Secondary | ICD-10-CM | POA: Diagnosis not present

## 2016-11-11 DIAGNOSIS — M6281 Muscle weakness (generalized): Secondary | ICD-10-CM | POA: Diagnosis not present

## 2016-11-11 DIAGNOSIS — M1712 Unilateral primary osteoarthritis, left knee: Secondary | ICD-10-CM | POA: Diagnosis not present

## 2016-11-11 DIAGNOSIS — M25562 Pain in left knee: Secondary | ICD-10-CM | POA: Diagnosis not present

## 2016-11-11 DIAGNOSIS — R2689 Other abnormalities of gait and mobility: Secondary | ICD-10-CM | POA: Diagnosis not present

## 2016-11-11 DIAGNOSIS — S83232D Complex tear of medial meniscus, current injury, left knee, subsequent encounter: Secondary | ICD-10-CM | POA: Diagnosis not present

## 2016-11-13 DIAGNOSIS — R2689 Other abnormalities of gait and mobility: Secondary | ICD-10-CM | POA: Diagnosis not present

## 2016-11-13 DIAGNOSIS — M1712 Unilateral primary osteoarthritis, left knee: Secondary | ICD-10-CM | POA: Diagnosis not present

## 2016-11-13 DIAGNOSIS — M25562 Pain in left knee: Secondary | ICD-10-CM | POA: Diagnosis not present

## 2016-11-13 DIAGNOSIS — M6281 Muscle weakness (generalized): Secondary | ICD-10-CM | POA: Diagnosis not present

## 2016-11-13 DIAGNOSIS — S83232D Complex tear of medial meniscus, current injury, left knee, subsequent encounter: Secondary | ICD-10-CM | POA: Diagnosis not present

## 2016-12-09 DIAGNOSIS — M1712 Unilateral primary osteoarthritis, left knee: Secondary | ICD-10-CM | POA: Diagnosis not present

## 2016-12-09 DIAGNOSIS — S83232D Complex tear of medial meniscus, current injury, left knee, subsequent encounter: Secondary | ICD-10-CM | POA: Diagnosis not present

## 2016-12-25 IMAGING — CT CT L SPINE W/ CM
1 of 6 series · 5 of 14 positions shown, 7 images · non-contrast
Comparison: Lumbar spine MRI 05/02/2014

CLINICAL DATA: Chronic low back pain. Intermittent lower extremity
pain, predominantly on the right. Prior lumbar surgery.
TECHNIQUE: Contiguous axial images were obtained through the Lumbar spine after
the intrathecal infusion of infusion. Coronal and sagittal
reconstructions were obtained of the axial image sets.

[Series 2: l spine soft (person_name) · axial · 0.27mm/px · z∈[-226,-58]mm · 5 of 85 slices shown, 7 images]
[im 15/85  soft-tissue]
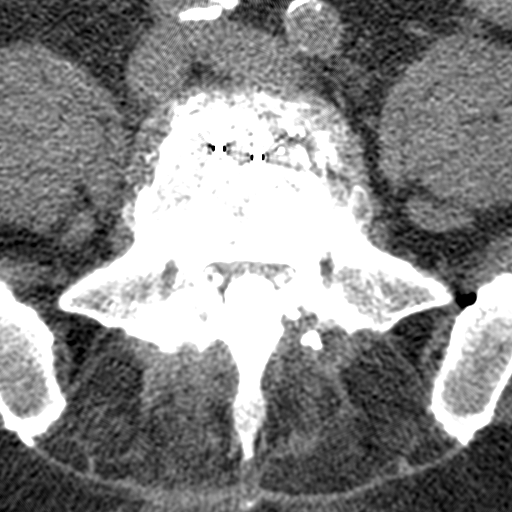
[im 15/85  bone]
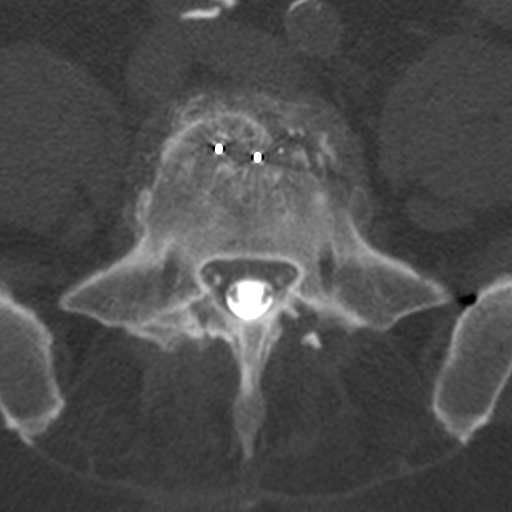
[im 29/85  bone]
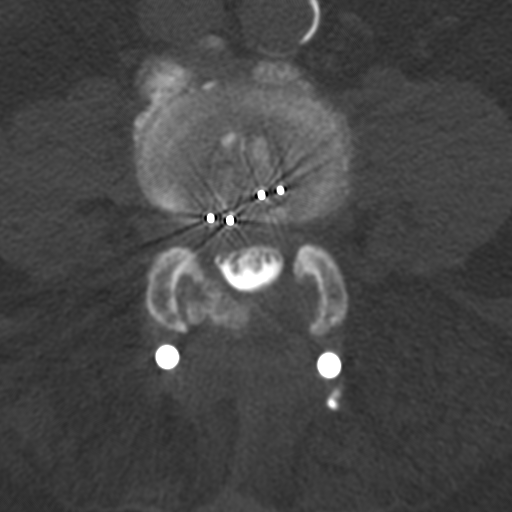
[im 43/85  bone]
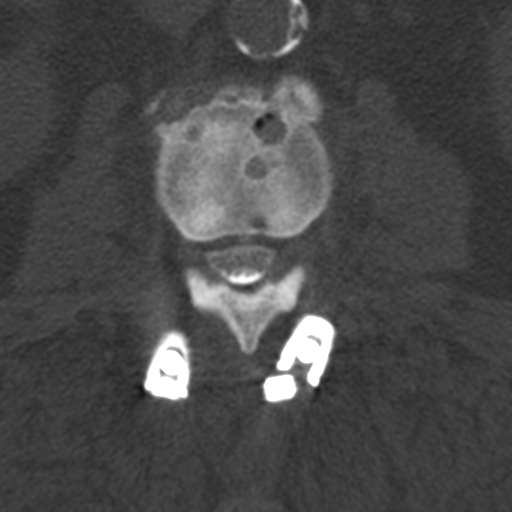
[im 57/85  bone]
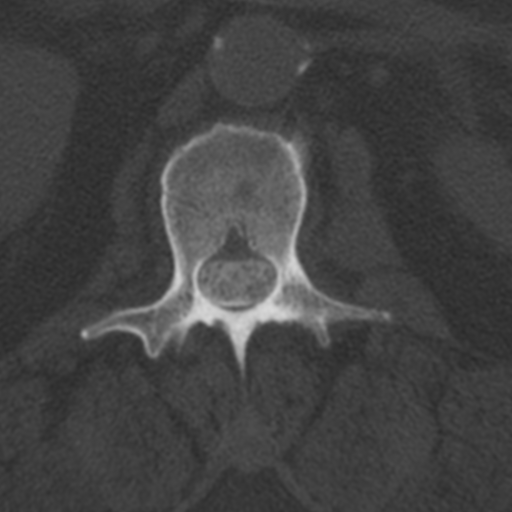
[im 71/85  soft-tissue]
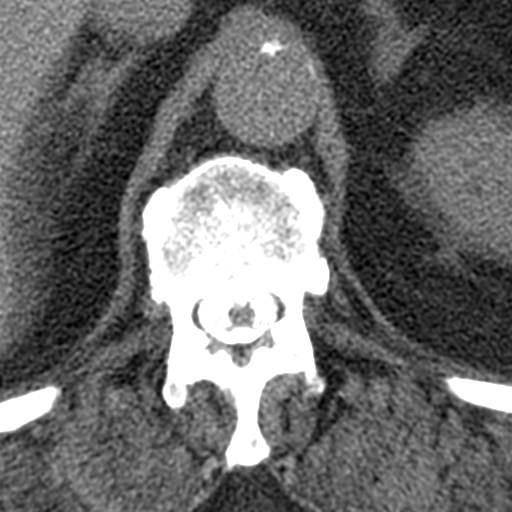
[im 71/85  bone]
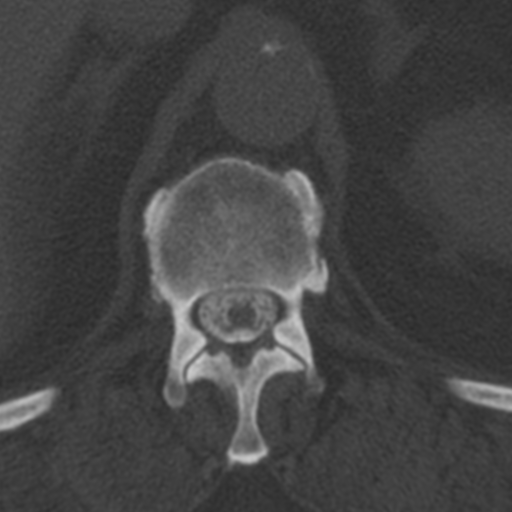

[5 of 14 positions shown; findings below may reference images not displayed]

EXAM:
LUMBAR MYELOGRAM

FLUOROSCOPY TIME:  Radiation Exposure Index (as provided by the
fluoroscopic device): 445.78 microGray*m^2

Fluoroscopy Time (in minutes and seconds):  38 seconds

PROCEDURE:
After thorough discussion of risks and benefits of the procedure
including bleeding, infection, injury to nerves, blood vessels,
adjacent structures as well as headache and CSF leak, written and
oral informed consent was obtained. Consent was obtained by Dr.
Rajoul Redone. Time out form was completed.

Patient was positioned prone on the fluoroscopy table. Local
anesthesia was provided with 1% lidocaine without epinephrine after
prepped and draped in the usual sterile fashion. Puncture was
performed at L2-3 using a 3 1/2 inch 22-gauge spinal needle via a
right paramedian approach. Using a single pass through the dura, the
needle was placed within the thecal sac, with return of clear CSF.
15 mL of Isovue M 200 was injected into the thecal sac, with normal
opacification of the nerve roots and cauda equina consistent with
free flow within the subarachnoid space.

I personally performed the lumbar puncture and administered the
intrathecal contrast. I also personally supervised acquisition of
the myelogram images.
FINDINGS: LUMBAR MYELOGRAM FINDINGS:

Lumbar numbering as described on an operative note from 10/23/2014.
There are small ribs at L1. Prior PLIF at L3-4 and L4-5. Prior L5-S1
fusion with interbody fusion device in place. Rudimentary S1-2 disc.

There is minimal, fused anterolisthesis of L5 on S1. No evidence of
dynamic instability on flexion or extension images. Ventral
extradural defects are present at L2-3 and L3-4 with likely moderate
spinal stenosis at L2-3 and at most mild residual narrowing at L3-4.
Aortic atherosclerosis.

CT LUMBAR MYELOGRAM FINDINGS:

Lumbar numbering as above. Trace anterolisthesis of L5 on S1. Prior
L3-S1 PLIF. Bilateral pedicle screws are in place from L3-L5 and
have been removed S1. There is mild lucency about the right L5
screw. There is evidence of solid intervertebral osseous fusion at
L5-S1. There is likely osseous fusion at L3-4 as well. Solid fusion
is not identified at L4-5. Solid posterolateral osseous fusion is
present bilaterally at L5-S1. Numerous small Schmorl's nodes are
present, with more prominent degenerative endplate changes at L3-4
and L4-5. The conus medullaris terminates at L1-2. Aortic
atherosclerosis is noted.

L1-2:  Trace disc bulging without stenosis, unchanged.

L2-3: Mild circumferential disc bulging, ligamentum flavum
thickening, mild-to-moderate facet and ligamentum flavum
hypertrophy, and prominent dorsal epidural fat result in moderate
spinal stenosis, slightly progressed from 3310. No significant
neural foraminal stenosis.

L3-4: Interval posterior decompression and fusion. Posterior
displacement of bone graft material by 6 mm to the right of midline
in the ventral epidural space without significant spinal or lateral
recess stenosis. No definite neural foraminal stenosis.

L3-4: Interval posterior decompression and fusion. Mild posterior
positioning of the right-sided interbody spacer with associated bone
graft material and endplate spurring projecting 7 mm posterior to
the disc space. Distortion of the right lateral recess without
evidence of neural compression. No spinal stenosis. Likely mild
residual neural foraminal narrowing bilaterally.

L5-S1: Prior posterior decompression. No spinal stenosis. Mild left
neural foraminal narrowing due to endplate spurring, unchanged.
IMPRESSION: 1. Interval postsurgical changes at L3-4 and L4-5 as above without
evidence of significant residual stenosis.
2. Progressive, moderate multifactorial spinal stenosis at L2-3.
3. Stable postoperative changes at L5-S1.
4. Aortic atherosclerosis.

## 2016-12-26 DIAGNOSIS — E782 Mixed hyperlipidemia: Secondary | ICD-10-CM | POA: Diagnosis not present

## 2016-12-26 DIAGNOSIS — E538 Deficiency of other specified B group vitamins: Secondary | ICD-10-CM | POA: Diagnosis not present

## 2016-12-26 DIAGNOSIS — D509 Iron deficiency anemia, unspecified: Secondary | ICD-10-CM | POA: Diagnosis not present

## 2016-12-26 DIAGNOSIS — E1140 Type 2 diabetes mellitus with diabetic neuropathy, unspecified: Secondary | ICD-10-CM | POA: Diagnosis not present

## 2016-12-26 DIAGNOSIS — Z79899 Other long term (current) drug therapy: Secondary | ICD-10-CM | POA: Diagnosis not present

## 2016-12-31 DIAGNOSIS — Z6838 Body mass index (BMI) 38.0-38.9, adult: Secondary | ICD-10-CM | POA: Diagnosis not present

## 2016-12-31 DIAGNOSIS — F1190 Opioid use, unspecified, uncomplicated: Secondary | ICD-10-CM | POA: Diagnosis not present

## 2016-12-31 DIAGNOSIS — E782 Mixed hyperlipidemia: Secondary | ICD-10-CM | POA: Diagnosis not present

## 2016-12-31 DIAGNOSIS — E669 Obesity, unspecified: Secondary | ICD-10-CM | POA: Diagnosis not present

## 2016-12-31 DIAGNOSIS — N183 Chronic kidney disease, stage 3 (moderate): Secondary | ICD-10-CM | POA: Diagnosis not present

## 2016-12-31 DIAGNOSIS — I10 Essential (primary) hypertension: Secondary | ICD-10-CM | POA: Diagnosis not present

## 2016-12-31 DIAGNOSIS — Z139 Encounter for screening, unspecified: Secondary | ICD-10-CM | POA: Diagnosis not present

## 2016-12-31 DIAGNOSIS — Z9181 History of falling: Secondary | ICD-10-CM | POA: Diagnosis not present

## 2016-12-31 DIAGNOSIS — E1140 Type 2 diabetes mellitus with diabetic neuropathy, unspecified: Secondary | ICD-10-CM | POA: Diagnosis not present

## 2017-01-25 DIAGNOSIS — M25462 Effusion, left knee: Secondary | ICD-10-CM | POA: Diagnosis not present

## 2017-01-25 DIAGNOSIS — M25562 Pain in left knee: Secondary | ICD-10-CM | POA: Diagnosis not present

## 2017-01-25 DIAGNOSIS — Z9889 Other specified postprocedural states: Secondary | ICD-10-CM | POA: Diagnosis not present

## 2017-01-28 DIAGNOSIS — M1712 Unilateral primary osteoarthritis, left knee: Secondary | ICD-10-CM | POA: Diagnosis not present

## 2017-01-28 DIAGNOSIS — S83232D Complex tear of medial meniscus, current injury, left knee, subsequent encounter: Secondary | ICD-10-CM | POA: Diagnosis not present

## 2017-03-31 DIAGNOSIS — M1712 Unilateral primary osteoarthritis, left knee: Secondary | ICD-10-CM | POA: Diagnosis not present

## 2017-04-02 DIAGNOSIS — R52 Pain, unspecified: Secondary | ICD-10-CM | POA: Diagnosis not present

## 2017-04-02 DIAGNOSIS — M79609 Pain in unspecified limb: Secondary | ICD-10-CM | POA: Diagnosis not present

## 2017-04-02 DIAGNOSIS — Z79899 Other long term (current) drug therapy: Secondary | ICD-10-CM | POA: Diagnosis not present

## 2017-04-02 DIAGNOSIS — Z01818 Encounter for other preprocedural examination: Secondary | ICD-10-CM | POA: Diagnosis not present

## 2017-04-04 DIAGNOSIS — I10 Essential (primary) hypertension: Secondary | ICD-10-CM | POA: Diagnosis not present

## 2017-04-04 DIAGNOSIS — Z6839 Body mass index (BMI) 39.0-39.9, adult: Secondary | ICD-10-CM | POA: Diagnosis not present

## 2017-04-04 DIAGNOSIS — M1710 Unilateral primary osteoarthritis, unspecified knee: Secondary | ICD-10-CM | POA: Diagnosis not present

## 2017-04-04 DIAGNOSIS — Z01810 Encounter for preprocedural cardiovascular examination: Secondary | ICD-10-CM | POA: Diagnosis not present

## 2017-04-04 DIAGNOSIS — Z01818 Encounter for other preprocedural examination: Secondary | ICD-10-CM | POA: Diagnosis not present

## 2017-04-04 DIAGNOSIS — E1140 Type 2 diabetes mellitus with diabetic neuropathy, unspecified: Secondary | ICD-10-CM | POA: Diagnosis not present

## 2017-04-21 DIAGNOSIS — M1712 Unilateral primary osteoarthritis, left knee: Secondary | ICD-10-CM | POA: Diagnosis not present

## 2017-04-30 DIAGNOSIS — Z79899 Other long term (current) drug therapy: Secondary | ICD-10-CM | POA: Diagnosis not present

## 2017-04-30 DIAGNOSIS — I10 Essential (primary) hypertension: Secondary | ICD-10-CM | POA: Diagnosis not present

## 2017-04-30 DIAGNOSIS — F17210 Nicotine dependence, cigarettes, uncomplicated: Secondary | ICD-10-CM | POA: Diagnosis not present

## 2017-04-30 DIAGNOSIS — K219 Gastro-esophageal reflux disease without esophagitis: Secondary | ICD-10-CM | POA: Diagnosis not present

## 2017-04-30 DIAGNOSIS — Z471 Aftercare following joint replacement surgery: Secondary | ICD-10-CM | POA: Diagnosis not present

## 2017-04-30 DIAGNOSIS — Z96652 Presence of left artificial knee joint: Secondary | ICD-10-CM | POA: Diagnosis not present

## 2017-04-30 DIAGNOSIS — Z888 Allergy status to other drugs, medicaments and biological substances status: Secondary | ICD-10-CM | POA: Diagnosis not present

## 2017-04-30 DIAGNOSIS — M109 Gout, unspecified: Secondary | ICD-10-CM | POA: Diagnosis not present

## 2017-04-30 DIAGNOSIS — Z7982 Long term (current) use of aspirin: Secondary | ICD-10-CM | POA: Diagnosis not present

## 2017-04-30 DIAGNOSIS — E538 Deficiency of other specified B group vitamins: Secondary | ICD-10-CM | POA: Diagnosis not present

## 2017-04-30 DIAGNOSIS — G8918 Other acute postprocedural pain: Secondary | ICD-10-CM | POA: Diagnosis not present

## 2017-04-30 DIAGNOSIS — M1712 Unilateral primary osteoarthritis, left knee: Secondary | ICD-10-CM | POA: Diagnosis not present

## 2017-04-30 DIAGNOSIS — E7800 Pure hypercholesterolemia, unspecified: Secondary | ICD-10-CM | POA: Diagnosis not present

## 2017-04-30 DIAGNOSIS — E119 Type 2 diabetes mellitus without complications: Secondary | ICD-10-CM | POA: Diagnosis not present

## 2017-05-03 DIAGNOSIS — Z7982 Long term (current) use of aspirin: Secondary | ICD-10-CM | POA: Diagnosis not present

## 2017-05-03 DIAGNOSIS — E669 Obesity, unspecified: Secondary | ICD-10-CM | POA: Diagnosis not present

## 2017-05-03 DIAGNOSIS — Z7984 Long term (current) use of oral hypoglycemic drugs: Secondary | ICD-10-CM | POA: Diagnosis not present

## 2017-05-03 DIAGNOSIS — Z79891 Long term (current) use of opiate analgesic: Secondary | ICD-10-CM | POA: Diagnosis not present

## 2017-05-03 DIAGNOSIS — Z471 Aftercare following joint replacement surgery: Secondary | ICD-10-CM | POA: Diagnosis not present

## 2017-05-03 DIAGNOSIS — Z6837 Body mass index (BMI) 37.0-37.9, adult: Secondary | ICD-10-CM | POA: Diagnosis not present

## 2017-05-03 DIAGNOSIS — E119 Type 2 diabetes mellitus without complications: Secondary | ICD-10-CM | POA: Diagnosis not present

## 2017-05-03 DIAGNOSIS — Z96652 Presence of left artificial knee joint: Secondary | ICD-10-CM | POA: Diagnosis not present

## 2017-05-03 DIAGNOSIS — I10 Essential (primary) hypertension: Secondary | ICD-10-CM | POA: Diagnosis not present

## 2017-05-14 DIAGNOSIS — M25562 Pain in left knee: Secondary | ICD-10-CM | POA: Diagnosis not present

## 2017-05-14 DIAGNOSIS — R2689 Other abnormalities of gait and mobility: Secondary | ICD-10-CM | POA: Diagnosis not present

## 2017-05-14 DIAGNOSIS — M25662 Stiffness of left knee, not elsewhere classified: Secondary | ICD-10-CM | POA: Diagnosis not present

## 2017-05-16 DIAGNOSIS — M25562 Pain in left knee: Secondary | ICD-10-CM | POA: Diagnosis not present

## 2017-05-16 DIAGNOSIS — R2689 Other abnormalities of gait and mobility: Secondary | ICD-10-CM | POA: Diagnosis not present

## 2017-05-16 DIAGNOSIS — M25662 Stiffness of left knee, not elsewhere classified: Secondary | ICD-10-CM | POA: Diagnosis not present

## 2017-05-20 DIAGNOSIS — M25562 Pain in left knee: Secondary | ICD-10-CM | POA: Diagnosis not present

## 2017-05-20 DIAGNOSIS — M25662 Stiffness of left knee, not elsewhere classified: Secondary | ICD-10-CM | POA: Diagnosis not present

## 2017-05-20 DIAGNOSIS — R2689 Other abnormalities of gait and mobility: Secondary | ICD-10-CM | POA: Diagnosis not present

## 2017-05-23 DIAGNOSIS — M25562 Pain in left knee: Secondary | ICD-10-CM | POA: Diagnosis not present

## 2017-05-23 DIAGNOSIS — M25662 Stiffness of left knee, not elsewhere classified: Secondary | ICD-10-CM | POA: Diagnosis not present

## 2017-05-23 DIAGNOSIS — R2689 Other abnormalities of gait and mobility: Secondary | ICD-10-CM | POA: Diagnosis not present

## 2017-05-27 DIAGNOSIS — M25562 Pain in left knee: Secondary | ICD-10-CM | POA: Diagnosis not present

## 2017-05-27 DIAGNOSIS — R2689 Other abnormalities of gait and mobility: Secondary | ICD-10-CM | POA: Diagnosis not present

## 2017-05-27 DIAGNOSIS — M25662 Stiffness of left knee, not elsewhere classified: Secondary | ICD-10-CM | POA: Diagnosis not present

## 2017-05-29 DIAGNOSIS — R2689 Other abnormalities of gait and mobility: Secondary | ICD-10-CM | POA: Diagnosis not present

## 2017-05-29 DIAGNOSIS — M25662 Stiffness of left knee, not elsewhere classified: Secondary | ICD-10-CM | POA: Diagnosis not present

## 2017-05-29 DIAGNOSIS — M25562 Pain in left knee: Secondary | ICD-10-CM | POA: Diagnosis not present

## 2017-06-03 DIAGNOSIS — M25562 Pain in left knee: Secondary | ICD-10-CM | POA: Diagnosis not present

## 2017-06-03 DIAGNOSIS — R2689 Other abnormalities of gait and mobility: Secondary | ICD-10-CM | POA: Diagnosis not present

## 2017-06-03 DIAGNOSIS — M25662 Stiffness of left knee, not elsewhere classified: Secondary | ICD-10-CM | POA: Diagnosis not present

## 2017-06-05 DIAGNOSIS — D509 Iron deficiency anemia, unspecified: Secondary | ICD-10-CM | POA: Diagnosis not present

## 2017-06-05 DIAGNOSIS — E782 Mixed hyperlipidemia: Secondary | ICD-10-CM | POA: Diagnosis not present

## 2017-06-05 DIAGNOSIS — M1712 Unilateral primary osteoarthritis, left knee: Secondary | ICD-10-CM | POA: Diagnosis not present

## 2017-06-05 DIAGNOSIS — Z96652 Presence of left artificial knee joint: Secondary | ICD-10-CM | POA: Diagnosis not present

## 2017-06-05 DIAGNOSIS — E1140 Type 2 diabetes mellitus with diabetic neuropathy, unspecified: Secondary | ICD-10-CM | POA: Diagnosis not present

## 2017-06-06 DIAGNOSIS — R2689 Other abnormalities of gait and mobility: Secondary | ICD-10-CM | POA: Diagnosis not present

## 2017-06-06 DIAGNOSIS — M25662 Stiffness of left knee, not elsewhere classified: Secondary | ICD-10-CM | POA: Diagnosis not present

## 2017-06-06 DIAGNOSIS — M25562 Pain in left knee: Secondary | ICD-10-CM | POA: Diagnosis not present

## 2017-06-10 DIAGNOSIS — E1140 Type 2 diabetes mellitus with diabetic neuropathy, unspecified: Secondary | ICD-10-CM | POA: Diagnosis not present

## 2017-06-10 DIAGNOSIS — E782 Mixed hyperlipidemia: Secondary | ICD-10-CM | POA: Diagnosis not present

## 2017-06-10 DIAGNOSIS — M545 Low back pain: Secondary | ICD-10-CM | POA: Diagnosis not present

## 2017-06-10 DIAGNOSIS — Z6837 Body mass index (BMI) 37.0-37.9, adult: Secondary | ICD-10-CM | POA: Diagnosis not present

## 2017-06-10 DIAGNOSIS — E669 Obesity, unspecified: Secondary | ICD-10-CM | POA: Diagnosis not present

## 2017-06-10 DIAGNOSIS — D509 Iron deficiency anemia, unspecified: Secondary | ICD-10-CM | POA: Diagnosis not present

## 2017-06-10 DIAGNOSIS — I10 Essential (primary) hypertension: Secondary | ICD-10-CM | POA: Diagnosis not present

## 2017-06-13 DIAGNOSIS — R2689 Other abnormalities of gait and mobility: Secondary | ICD-10-CM | POA: Diagnosis not present

## 2017-06-13 DIAGNOSIS — M25662 Stiffness of left knee, not elsewhere classified: Secondary | ICD-10-CM | POA: Diagnosis not present

## 2017-06-13 DIAGNOSIS — M25562 Pain in left knee: Secondary | ICD-10-CM | POA: Diagnosis not present

## 2017-06-17 DIAGNOSIS — M25662 Stiffness of left knee, not elsewhere classified: Secondary | ICD-10-CM | POA: Diagnosis not present

## 2017-06-17 DIAGNOSIS — R2689 Other abnormalities of gait and mobility: Secondary | ICD-10-CM | POA: Diagnosis not present

## 2017-06-17 DIAGNOSIS — M25562 Pain in left knee: Secondary | ICD-10-CM | POA: Diagnosis not present

## 2017-06-20 DIAGNOSIS — I10 Essential (primary) hypertension: Secondary | ICD-10-CM | POA: Diagnosis not present

## 2017-06-20 DIAGNOSIS — F17200 Nicotine dependence, unspecified, uncomplicated: Secondary | ICD-10-CM | POA: Diagnosis not present

## 2017-06-20 DIAGNOSIS — M25662 Stiffness of left knee, not elsewhere classified: Secondary | ICD-10-CM | POA: Diagnosis not present

## 2017-06-20 DIAGNOSIS — Z96652 Presence of left artificial knee joint: Secondary | ICD-10-CM | POA: Diagnosis not present

## 2017-06-20 DIAGNOSIS — Y838 Other surgical procedures as the cause of abnormal reaction of the patient, or of later complication, without mention of misadventure at the time of the procedure: Secondary | ICD-10-CM | POA: Diagnosis not present

## 2017-06-20 DIAGNOSIS — M109 Gout, unspecified: Secondary | ICD-10-CM | POA: Diagnosis not present

## 2017-06-20 DIAGNOSIS — E11628 Type 2 diabetes mellitus with other skin complications: Secondary | ICD-10-CM | POA: Diagnosis not present

## 2017-06-20 DIAGNOSIS — T8189XA Other complications of procedures, not elsewhere classified, initial encounter: Secondary | ICD-10-CM | POA: Diagnosis not present

## 2017-06-20 DIAGNOSIS — M25562 Pain in left knee: Secondary | ICD-10-CM | POA: Diagnosis not present

## 2017-06-20 DIAGNOSIS — M1990 Unspecified osteoarthritis, unspecified site: Secondary | ICD-10-CM | POA: Diagnosis not present

## 2017-06-20 DIAGNOSIS — R2689 Other abnormalities of gait and mobility: Secondary | ICD-10-CM | POA: Diagnosis not present

## 2017-06-20 DIAGNOSIS — S81002A Unspecified open wound, left knee, initial encounter: Secondary | ICD-10-CM | POA: Diagnosis not present

## 2017-06-23 DIAGNOSIS — S81002A Unspecified open wound, left knee, initial encounter: Secondary | ICD-10-CM | POA: Diagnosis not present

## 2017-06-24 DIAGNOSIS — R2689 Other abnormalities of gait and mobility: Secondary | ICD-10-CM | POA: Diagnosis not present

## 2017-06-24 DIAGNOSIS — M25662 Stiffness of left knee, not elsewhere classified: Secondary | ICD-10-CM | POA: Diagnosis not present

## 2017-06-24 DIAGNOSIS — M25562 Pain in left knee: Secondary | ICD-10-CM | POA: Diagnosis not present

## 2017-06-26 DIAGNOSIS — E785 Hyperlipidemia, unspecified: Secondary | ICD-10-CM | POA: Diagnosis not present

## 2017-06-26 DIAGNOSIS — Z125 Encounter for screening for malignant neoplasm of prostate: Secondary | ICD-10-CM | POA: Diagnosis not present

## 2017-06-26 DIAGNOSIS — Z1389 Encounter for screening for other disorder: Secondary | ICD-10-CM | POA: Diagnosis not present

## 2017-06-26 DIAGNOSIS — Z6836 Body mass index (BMI) 36.0-36.9, adult: Secondary | ICD-10-CM | POA: Diagnosis not present

## 2017-06-26 DIAGNOSIS — Z0000 Encounter for general adult medical examination without abnormal findings: Secondary | ICD-10-CM | POA: Diagnosis not present

## 2017-06-26 DIAGNOSIS — Z9181 History of falling: Secondary | ICD-10-CM | POA: Diagnosis not present

## 2017-06-26 DIAGNOSIS — Z136 Encounter for screening for cardiovascular disorders: Secondary | ICD-10-CM | POA: Diagnosis not present

## 2017-06-27 DIAGNOSIS — Z96652 Presence of left artificial knee joint: Secondary | ICD-10-CM | POA: Diagnosis not present

## 2017-06-27 DIAGNOSIS — M1712 Unilateral primary osteoarthritis, left knee: Secondary | ICD-10-CM | POA: Diagnosis not present

## 2017-06-30 DIAGNOSIS — Z96652 Presence of left artificial knee joint: Secondary | ICD-10-CM | POA: Diagnosis not present

## 2017-06-30 DIAGNOSIS — S81002A Unspecified open wound, left knee, initial encounter: Secondary | ICD-10-CM | POA: Diagnosis not present

## 2017-06-30 DIAGNOSIS — T8189XA Other complications of procedures, not elsewhere classified, initial encounter: Secondary | ICD-10-CM | POA: Diagnosis not present

## 2017-06-30 DIAGNOSIS — Y838 Other surgical procedures as the cause of abnormal reaction of the patient, or of later complication, without mention of misadventure at the time of the procedure: Secondary | ICD-10-CM | POA: Diagnosis not present

## 2017-07-01 DIAGNOSIS — R2689 Other abnormalities of gait and mobility: Secondary | ICD-10-CM | POA: Diagnosis not present

## 2017-07-01 DIAGNOSIS — M25562 Pain in left knee: Secondary | ICD-10-CM | POA: Diagnosis not present

## 2017-07-01 DIAGNOSIS — M25662 Stiffness of left knee, not elsewhere classified: Secondary | ICD-10-CM | POA: Diagnosis not present

## 2017-07-07 DIAGNOSIS — T8189XA Other complications of procedures, not elsewhere classified, initial encounter: Secondary | ICD-10-CM | POA: Diagnosis not present

## 2017-07-07 DIAGNOSIS — Z96652 Presence of left artificial knee joint: Secondary | ICD-10-CM | POA: Diagnosis not present

## 2017-07-07 DIAGNOSIS — S81002A Unspecified open wound, left knee, initial encounter: Secondary | ICD-10-CM | POA: Diagnosis not present

## 2017-07-08 DIAGNOSIS — R2689 Other abnormalities of gait and mobility: Secondary | ICD-10-CM | POA: Diagnosis not present

## 2017-07-08 DIAGNOSIS — M25662 Stiffness of left knee, not elsewhere classified: Secondary | ICD-10-CM | POA: Diagnosis not present

## 2017-07-08 DIAGNOSIS — M25562 Pain in left knee: Secondary | ICD-10-CM | POA: Diagnosis not present

## 2017-07-11 DIAGNOSIS — M25662 Stiffness of left knee, not elsewhere classified: Secondary | ICD-10-CM | POA: Diagnosis not present

## 2017-07-11 DIAGNOSIS — R2689 Other abnormalities of gait and mobility: Secondary | ICD-10-CM | POA: Diagnosis not present

## 2017-07-11 DIAGNOSIS — M25562 Pain in left knee: Secondary | ICD-10-CM | POA: Diagnosis not present

## 2017-07-18 DIAGNOSIS — T8189XA Other complications of procedures, not elsewhere classified, initial encounter: Secondary | ICD-10-CM | POA: Diagnosis not present

## 2017-07-18 DIAGNOSIS — S81002D Unspecified open wound, left knee, subsequent encounter: Secondary | ICD-10-CM | POA: Diagnosis not present

## 2017-07-18 DIAGNOSIS — Z96652 Presence of left artificial knee joint: Secondary | ICD-10-CM | POA: Diagnosis not present

## 2017-07-24 DIAGNOSIS — M545 Low back pain: Secondary | ICD-10-CM | POA: Diagnosis not present

## 2017-07-25 DIAGNOSIS — W3400XA Accidental discharge from unspecified firearms or gun, initial encounter: Secondary | ICD-10-CM | POA: Diagnosis not present

## 2017-07-25 DIAGNOSIS — S81002A Unspecified open wound, left knee, initial encounter: Secondary | ICD-10-CM | POA: Diagnosis not present

## 2017-07-25 DIAGNOSIS — L0390 Cellulitis, unspecified: Secondary | ICD-10-CM | POA: Diagnosis not present

## 2017-07-26 DIAGNOSIS — B999 Unspecified infectious disease: Secondary | ICD-10-CM | POA: Diagnosis not present

## 2017-07-26 DIAGNOSIS — E1122 Type 2 diabetes mellitus with diabetic chronic kidney disease: Secondary | ICD-10-CM | POA: Diagnosis not present

## 2017-07-26 DIAGNOSIS — I739 Peripheral vascular disease, unspecified: Secondary | ICD-10-CM | POA: Diagnosis not present

## 2017-07-26 DIAGNOSIS — E785 Hyperlipidemia, unspecified: Secondary | ICD-10-CM | POA: Diagnosis not present

## 2017-07-26 DIAGNOSIS — Z720 Tobacco use: Secondary | ICD-10-CM | POA: Diagnosis not present

## 2017-07-26 DIAGNOSIS — Z6837 Body mass index (BMI) 37.0-37.9, adult: Secondary | ICD-10-CM | POA: Diagnosis not present

## 2017-07-26 DIAGNOSIS — L03116 Cellulitis of left lower limb: Secondary | ICD-10-CM | POA: Diagnosis not present

## 2017-07-26 DIAGNOSIS — S81002S Unspecified open wound, left knee, sequela: Secondary | ICD-10-CM | POA: Diagnosis not present

## 2017-07-26 DIAGNOSIS — Z7984 Long term (current) use of oral hypoglycemic drugs: Secondary | ICD-10-CM | POA: Diagnosis not present

## 2017-07-26 DIAGNOSIS — E669 Obesity, unspecified: Secondary | ICD-10-CM | POA: Diagnosis not present

## 2017-07-26 DIAGNOSIS — D72829 Elevated white blood cell count, unspecified: Secondary | ICD-10-CM | POA: Diagnosis not present

## 2017-07-26 DIAGNOSIS — Z79899 Other long term (current) drug therapy: Secondary | ICD-10-CM | POA: Diagnosis not present

## 2017-07-26 DIAGNOSIS — E1151 Type 2 diabetes mellitus with diabetic peripheral angiopathy without gangrene: Secondary | ICD-10-CM | POA: Diagnosis not present

## 2017-07-26 DIAGNOSIS — Z96652 Presence of left artificial knee joint: Secondary | ICD-10-CM | POA: Diagnosis not present

## 2017-07-26 DIAGNOSIS — Z79891 Long term (current) use of opiate analgesic: Secondary | ICD-10-CM | POA: Diagnosis not present

## 2017-07-26 DIAGNOSIS — T814XXD Infection following a procedure, subsequent encounter: Secondary | ICD-10-CM | POA: Diagnosis not present

## 2017-07-26 DIAGNOSIS — Z7901 Long term (current) use of anticoagulants: Secondary | ICD-10-CM | POA: Diagnosis not present

## 2017-07-26 DIAGNOSIS — S8002XA Contusion of left knee, initial encounter: Secondary | ICD-10-CM | POA: Diagnosis not present

## 2017-07-26 DIAGNOSIS — Z7982 Long term (current) use of aspirin: Secondary | ICD-10-CM | POA: Diagnosis not present

## 2017-07-26 DIAGNOSIS — B9561 Methicillin susceptible Staphylococcus aureus infection as the cause of diseases classified elsewhere: Secondary | ICD-10-CM | POA: Diagnosis not present

## 2017-07-26 DIAGNOSIS — Z452 Encounter for adjustment and management of vascular access device: Secondary | ICD-10-CM | POA: Diagnosis not present

## 2017-07-26 DIAGNOSIS — D649 Anemia, unspecified: Secondary | ICD-10-CM | POA: Diagnosis not present

## 2017-07-26 DIAGNOSIS — Z89522 Acquired absence of left knee: Secondary | ICD-10-CM | POA: Diagnosis not present

## 2017-07-26 DIAGNOSIS — T8454XA Infection and inflammatory reaction due to internal left knee prosthesis, initial encounter: Secondary | ICD-10-CM | POA: Diagnosis not present

## 2017-07-26 DIAGNOSIS — Z5181 Encounter for therapeutic drug level monitoring: Secondary | ICD-10-CM | POA: Diagnosis not present

## 2017-07-26 DIAGNOSIS — G8918 Other acute postprocedural pain: Secondary | ICD-10-CM | POA: Diagnosis not present

## 2017-07-26 DIAGNOSIS — A419 Sepsis, unspecified organism: Secondary | ICD-10-CM | POA: Diagnosis not present

## 2017-07-26 DIAGNOSIS — N179 Acute kidney failure, unspecified: Secondary | ICD-10-CM | POA: Diagnosis not present

## 2017-07-26 DIAGNOSIS — Z471 Aftercare following joint replacement surgery: Secondary | ICD-10-CM | POA: Diagnosis not present

## 2017-07-26 DIAGNOSIS — T814XXA Infection following a procedure, initial encounter: Secondary | ICD-10-CM | POA: Diagnosis not present

## 2017-07-26 DIAGNOSIS — E119 Type 2 diabetes mellitus without complications: Secondary | ICD-10-CM | POA: Diagnosis not present

## 2017-07-26 DIAGNOSIS — I96 Gangrene, not elsewhere classified: Secondary | ICD-10-CM | POA: Diagnosis not present

## 2017-07-26 DIAGNOSIS — Z792 Long term (current) use of antibiotics: Secondary | ICD-10-CM | POA: Diagnosis not present

## 2017-07-26 DIAGNOSIS — I129 Hypertensive chronic kidney disease with stage 1 through stage 4 chronic kidney disease, or unspecified chronic kidney disease: Secondary | ICD-10-CM | POA: Diagnosis not present

## 2017-07-26 DIAGNOSIS — M00062 Staphylococcal arthritis, left knee: Secondary | ICD-10-CM | POA: Diagnosis not present

## 2017-07-26 DIAGNOSIS — N183 Chronic kidney disease, stage 3 (moderate): Secondary | ICD-10-CM | POA: Diagnosis not present

## 2017-07-26 DIAGNOSIS — F17210 Nicotine dependence, cigarettes, uncomplicated: Secondary | ICD-10-CM | POA: Diagnosis not present

## 2017-07-26 DIAGNOSIS — I10 Essential (primary) hypertension: Secondary | ICD-10-CM | POA: Diagnosis not present

## 2017-07-26 DIAGNOSIS — Z9181 History of falling: Secondary | ICD-10-CM | POA: Diagnosis not present

## 2017-07-27 DIAGNOSIS — T8454XA Infection and inflammatory reaction due to internal left knee prosthesis, initial encounter: Secondary | ICD-10-CM | POA: Diagnosis not present

## 2017-07-27 DIAGNOSIS — I10 Essential (primary) hypertension: Secondary | ICD-10-CM | POA: Diagnosis not present

## 2017-07-27 DIAGNOSIS — Z96652 Presence of left artificial knee joint: Secondary | ICD-10-CM | POA: Diagnosis not present

## 2017-07-27 DIAGNOSIS — E119 Type 2 diabetes mellitus without complications: Secondary | ICD-10-CM | POA: Diagnosis not present

## 2017-07-27 DIAGNOSIS — S81002S Unspecified open wound, left knee, sequela: Secondary | ICD-10-CM | POA: Diagnosis not present

## 2017-07-27 DIAGNOSIS — Z720 Tobacco use: Secondary | ICD-10-CM | POA: Diagnosis not present

## 2017-07-27 DIAGNOSIS — G8918 Other acute postprocedural pain: Secondary | ICD-10-CM | POA: Diagnosis not present

## 2017-07-30 DIAGNOSIS — Z452 Encounter for adjustment and management of vascular access device: Secondary | ICD-10-CM | POA: Diagnosis not present

## 2017-07-30 DIAGNOSIS — Z792 Long term (current) use of antibiotics: Secondary | ICD-10-CM | POA: Diagnosis not present

## 2017-07-30 DIAGNOSIS — E1151 Type 2 diabetes mellitus with diabetic peripheral angiopathy without gangrene: Secondary | ICD-10-CM | POA: Diagnosis not present

## 2017-07-30 DIAGNOSIS — B9561 Methicillin susceptible Staphylococcus aureus infection as the cause of diseases classified elsewhere: Secondary | ICD-10-CM | POA: Diagnosis not present

## 2017-07-30 DIAGNOSIS — Z79891 Long term (current) use of opiate analgesic: Secondary | ICD-10-CM | POA: Diagnosis not present

## 2017-07-30 DIAGNOSIS — Z7984 Long term (current) use of oral hypoglycemic drugs: Secondary | ICD-10-CM | POA: Diagnosis not present

## 2017-07-30 DIAGNOSIS — F17210 Nicotine dependence, cigarettes, uncomplicated: Secondary | ICD-10-CM | POA: Diagnosis not present

## 2017-07-30 DIAGNOSIS — Z9181 History of falling: Secondary | ICD-10-CM | POA: Diagnosis not present

## 2017-07-30 DIAGNOSIS — Z7982 Long term (current) use of aspirin: Secondary | ICD-10-CM | POA: Diagnosis not present

## 2017-07-30 DIAGNOSIS — I10 Essential (primary) hypertension: Secondary | ICD-10-CM | POA: Diagnosis not present

## 2017-07-30 DIAGNOSIS — Z89522 Acquired absence of left knee: Secondary | ICD-10-CM | POA: Diagnosis not present

## 2017-07-30 DIAGNOSIS — Z6837 Body mass index (BMI) 37.0-37.9, adult: Secondary | ICD-10-CM | POA: Diagnosis not present

## 2017-07-30 DIAGNOSIS — T8454XA Infection and inflammatory reaction due to internal left knee prosthesis, initial encounter: Secondary | ICD-10-CM | POA: Diagnosis not present

## 2017-07-30 DIAGNOSIS — E669 Obesity, unspecified: Secondary | ICD-10-CM | POA: Diagnosis not present

## 2017-07-30 DIAGNOSIS — Z5181 Encounter for therapeutic drug level monitoring: Secondary | ICD-10-CM | POA: Diagnosis not present

## 2017-07-30 DIAGNOSIS — Z7901 Long term (current) use of anticoagulants: Secondary | ICD-10-CM | POA: Diagnosis not present

## 2017-07-31 DIAGNOSIS — L03116 Cellulitis of left lower limb: Secondary | ICD-10-CM | POA: Diagnosis not present

## 2017-07-31 DIAGNOSIS — Z96652 Presence of left artificial knee joint: Secondary | ICD-10-CM | POA: Diagnosis not present

## 2017-07-31 DIAGNOSIS — S81002S Unspecified open wound, left knee, sequela: Secondary | ICD-10-CM | POA: Diagnosis not present

## 2017-08-04 ENCOUNTER — Ambulatory Visit: Payer: PPO | Admitting: Internal Medicine

## 2017-08-04 ENCOUNTER — Other Ambulatory Visit: Payer: Self-pay | Admitting: *Deleted

## 2017-08-04 ENCOUNTER — Ambulatory Visit (INDEPENDENT_AMBULATORY_CARE_PROVIDER_SITE_OTHER): Payer: PPO | Admitting: Internal Medicine

## 2017-08-04 ENCOUNTER — Encounter: Payer: Self-pay | Admitting: Internal Medicine

## 2017-08-04 VITALS — BP 135/76 | HR 77 | Temp 98.9°F | Ht 69.0 in | Wt 255.0 lb

## 2017-08-04 DIAGNOSIS — L98491 Non-pressure chronic ulcer of skin of other sites limited to breakdown of skin: Secondary | ICD-10-CM | POA: Diagnosis not present

## 2017-08-04 DIAGNOSIS — T8459XA Infection and inflammatory reaction due to other internal joint prosthesis, initial encounter: Secondary | ICD-10-CM

## 2017-08-04 DIAGNOSIS — Z96659 Presence of unspecified artificial knee joint: Secondary | ICD-10-CM | POA: Diagnosis not present

## 2017-08-04 DIAGNOSIS — Z79899 Other long term (current) drug therapy: Secondary | ICD-10-CM | POA: Diagnosis not present

## 2017-08-04 DIAGNOSIS — A4901 Methicillin susceptible Staphylococcus aureus infection, unspecified site: Secondary | ICD-10-CM

## 2017-08-04 DIAGNOSIS — Z792 Long term (current) use of antibiotics: Secondary | ICD-10-CM | POA: Diagnosis not present

## 2017-08-04 DIAGNOSIS — I739 Peripheral vascular disease, unspecified: Secondary | ICD-10-CM

## 2017-08-04 DIAGNOSIS — E119 Type 2 diabetes mellitus without complications: Secondary | ICD-10-CM | POA: Diagnosis not present

## 2017-08-06 DIAGNOSIS — L03116 Cellulitis of left lower limb: Secondary | ICD-10-CM | POA: Diagnosis not present

## 2017-08-06 DIAGNOSIS — Z96652 Presence of left artificial knee joint: Secondary | ICD-10-CM | POA: Diagnosis not present

## 2017-08-06 NOTE — Progress Notes (Signed)
RFV: community referral for PJI  Patient ID: Erik Martin, male   DOB: 03-08-1951, 66 y.o.   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 He states that he has had poor wound healing ever since the original pji performed by dr Adin Hector..he underwent vascular imaging that shows a possible blockage that could be remedied. He is being referred to [facility] vascular surgery for eval later this week. Home health has been coming to him to do wound vac changes.   Outpatient Encounter Prescriptions as of 08/04/2017  Medication Sig  . amLODipine (NORVASC) 10 MG tablet   . atorvastatin (LIPITOR) 20 MG tablet Take 20 mg by mouth daily.  Marland Kitchen ceFAZolin (ANCEF) IVPB Inject into the vein every 8 (eight) hours.  . Coenzyme Q10 (COQ10) 200 MG CAPS Take 200 mg by mouth.  . diphenhydrAMINE (BENADRYL) 25 mg capsule Take 25 mg by mouth every 6 (six) hours as needed.  . enoxaparin (LOVENOX) 30 MG/0.3ML injection   . fenofibrate micronized (LOFIBRA) 134 MG capsule Take 134 mg by mouth daily before breakfast.  . ferrous sulfate 325 (65 FE) MG tablet Take 325 mg by mouth.  . Flaxseed, Linseed, (FLAX SEED OIL PO) Take by mouth.  . hydrocortisone cream 1 % Apply 1 application topically 2 (two) times daily.  Marland Kitchen lisinopril (PRINIVIL,ZESTRIL) 20 MG tablet Take 20 mg by mouth daily.  Marland Kitchen MAGNESIUM OXIDE PO Take 500 mg by mouth daily.   . metFORMIN (GLUCOPHAGE) 1000 MG tablet Take 1 tablet (1,000 mg total) by mouth 2 (two) times daily with a meal.  . oxyCODONE (OXYCONTIN) 10 mg 12 hr tablet   . pantoprazole (PROTONIX) 40 MG tablet Take 40 mg by mouth daily as needed (acid reflux).  . pioglitazone (ACTOS) 45 MG tablet Take 45 mg by mouth.  . rosuvastatin (CRESTOR) 20 MG tablet   . vitamin B-12 (CYANOCOBALAMIN) 1000 MCG tablet Take 2,000 mcg by mouth daily.  . [DISCONTINUED] amLODipine (NORVASC) 5 MG tablet Take 2 tablets (10 mg total) by mouth  daily.  . [DISCONTINUED] aspirin EC 81 MG tablet Take 81 mg by mouth daily.  . [DISCONTINUED] cephALEXin (KEFLEX) 500 MG capsule   . [DISCONTINUED] cetirizine (ZYRTEC) 10 MG tablet Take 10 mg by mouth daily as needed for allergies.  . [DISCONTINUED] Coenzyme Q10 (CO Q-10 PO) Take 200 mg by mouth daily.  . [DISCONTINUED] cyclobenzaprine (FLEXERIL) 10 MG tablet Take 1 tablet (10 mg total) by mouth 3 (three) times daily as needed for muscle spasms. (Patient not taking: Reported on 09/09/2016)  . [DISCONTINUED] HYDROcodone-acetaminophen (NORCO) 10-325 MG per tablet Take 1 tablet by mouth 4 (four) times daily as needed (pain).  . [DISCONTINUED] oxyCODONE (OXY IR/ROXICODONE) 5 MG immediate release tablet   . [DISCONTINUED] Oxycodone HCl 10 MG TABS   . [DISCONTINUED] pioglitazone (ACTOS) 30 MG tablet Take 1 tablet (30 mg total) by mouth daily.  . [DISCONTINUED] zinc sulfate 220 (50 Zn) MG capsule Take 220 mg by mouth.   No facility-administered encounter medications on file as of 08/04/2017.      Patient Active Problem List   Diagnosis Date Noted  . Anemia 11/02/2014  . Hypoglycemia 11/01/2014  . Acute renal failure ([place]) 11/01/2014  . Diabetes mellitus type 2, uncontrolled ([place]) 11/01/2014  . Anemia, blood loss 11/01/2014  . Hypertension 11/01/2014  . Hyperlipidemia 11/01/2014  . Lumbar spinal stenosis 10/27/2014     Health Maintenance Due  Topic Date Due  . Hepatitis C Screening  December 01, 1951  . FOOT EXAM  12/11/1960  . OPHTHALMOLOGY EXAM  12/11/1960  . TETANUS/TDAP  12/11/1969  . COLONOSCOPY  12/11/2000  . HEMOGLOBIN A1C  05/02/2015  . PNA vac Low Risk Adult (1 of 2 - PCV13) 12/12/2015  . INFLUENZA VACCINE  07/30/2017     Review of Systems Review of Systems  Constitutional: Negative for fever, chills, diaphoresis, activity change, appetite change, fatigue and unexpected weight change.  HENT: Negative for congestion, sore throat, rhinorrhea, sneezing, trouble swallowing and sinus  pressure.  Eyes: Negative for photophobia and visual disturbance.  Respiratory: Negative for cough, chest tightness, shortness of breath, wheezing and stridor.  Cardiovascular: Negative for chest pain, palpitations and leg swelling.  Gastrointestinal: Negative for nausea, vomiting, abdominal pain, diarrhea, constipation, blood in stool, abdominal distention and anal bleeding.  Genitourinary: Negative for dysuria, hematuria, flank pain and difficulty urinating.  Musculoskeletal: Negative for myalgias, back pain, joint swelling, arthralgias and gait problem.  Skin: Negative for color change, pallor, rash and wound.  Neurological: Negative for dizziness, tremors, weakness and light-headedness.  Hematological: Negative for adenopathy. Does not bruise/bleed easily.  Psychiatric/Behavioral: Negative for behavioral problems, confusion, sleep disturbance, dysphoric mood, decreased concentration and agitation.   family history includes Diabetes Mellitus II in his brother and sister.   Social History  Substance Use Topics  . Smoking status: Current Every Day Smoker    Packs/day: 1.00    Years: 40.00    Types: Cigarettes  . Smokeless tobacco: Never Used     Comment: patient wants to quit today  . Alcohol use 1.8 oz/week    3 Shots of liquor per week   Physical Exam   BP 135/76   Pulse 77   Temp 98.9 F (37.2 C) (Oral)   Ht 5\' 9"  (1.753 m)   Wt 255 lb (115.7 kg) Comment: patient estimated, in wheelchair and nonweight bearing  BMI 37.66 kg/m    Physical Exam  Constitutional: He is oriented to person, place, and time. He appears well-developed and well-nourished. No distress.  HENT:  Mouth/Throat: Oropharynx is clear and moist. No oropharyngeal exudate.  Cardiovascular: Normal rate, regular rhythm and normal heart sounds. Exam reveals no gallop and no friction rub.  No murmur heard.  Pulmonary/Chest: Effort normal and breath sounds normal. No respiratory distress. He has no wheezes.    Abdominal: Soft. Bowel sounds are normal. He exhibits no distension. There is no tenderness.  Lymphadenopathy:  He has no cervical adenopathy.  Ext: left knee is wrapped, has wound vac in place Neurological: He is alert and oriented to person, place, and time.  Skin: Skin is warm and dry. No rash noted. No erythema.  Psychiatric: He has a normal mood and affect. His behavior is normal.    CBC Lab Results  Component Value Date   WBC 7.2 11/03/2014   RBC 3.21 (L) 11/03/2014   HGB 8.4 (L) 11/03/2014   HCT 27.5 (L) 11/03/2014   PLT 256 11/03/2014   MCV 85.7 11/03/2014   MCH 26.2 11/03/2014   MCHC 30.5 11/03/2014   RDW 15.7 (H) 11/03/2014   LYMPHSABS 1.6 11/02/2014   MONOABS 1.4 (H) 11/02/2014   EOSABS 0.2 11/02/2014    BMET Lab Results  Component Value Date   NA 138 11/04/2014   K 3.9 11/04/2014   CL 100 11/04/2014   CO2 23 11/04/2014   GLUCOSE 238 (H) 11/04/2014   BUN 21 11/04/2014   CREATININE 1.24 11/04/2014  CALCIUM 9.9 11/04/2014   GFRNONAA 60 (L) 11/04/2014   GFRAA 70 (L) 11/04/2014      Assessment and Plan  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 To end in first week of September then will discuss that it may need to be extended or changed to orals based upon sed rate and crp markers as well as to see how his ulcer inferrior to patella is healing.  Knee Ulcer = continue with wound vac  PVD = getting referred to vascular surgery to see if amenable to intervention  Smoking cessation counseling = spent 3 min discussing importance of smoking cessation in the setting of pvd and wound healing

## 2017-08-07 DIAGNOSIS — T8454XA Infection and inflammatory reaction due to internal left knee prosthesis, initial encounter: Secondary | ICD-10-CM | POA: Diagnosis not present

## 2017-08-07 DIAGNOSIS — I70202 Unspecified atherosclerosis of native arteries of extremities, left leg: Secondary | ICD-10-CM | POA: Diagnosis not present

## 2017-08-07 DIAGNOSIS — Z4789 Encounter for other orthopedic aftercare: Secondary | ICD-10-CM | POA: Diagnosis not present

## 2017-08-07 DIAGNOSIS — S81002A Unspecified open wound, left knee, initial encounter: Secondary | ICD-10-CM | POA: Diagnosis not present

## 2017-08-07 DIAGNOSIS — Z96652 Presence of left artificial knee joint: Secondary | ICD-10-CM | POA: Diagnosis not present

## 2017-08-07 DIAGNOSIS — I771 Stricture of artery: Secondary | ICD-10-CM | POA: Diagnosis not present

## 2017-08-08 DIAGNOSIS — Z96652 Presence of left artificial knee joint: Secondary | ICD-10-CM | POA: Diagnosis not present

## 2017-08-08 DIAGNOSIS — M1712 Unilateral primary osteoarthritis, left knee: Secondary | ICD-10-CM | POA: Diagnosis not present

## 2017-08-11 DIAGNOSIS — Z7902 Long term (current) use of antithrombotics/antiplatelets: Secondary | ICD-10-CM | POA: Diagnosis not present

## 2017-08-11 DIAGNOSIS — Z792 Long term (current) use of antibiotics: Secondary | ICD-10-CM | POA: Diagnosis not present

## 2017-08-12 DIAGNOSIS — Z6836 Body mass index (BMI) 36.0-36.9, adult: Secondary | ICD-10-CM | POA: Diagnosis not present

## 2017-08-12 DIAGNOSIS — E1140 Type 2 diabetes mellitus with diabetic neuropathy, unspecified: Secondary | ICD-10-CM | POA: Diagnosis not present

## 2017-08-12 DIAGNOSIS — M1712 Unilateral primary osteoarthritis, left knee: Secondary | ICD-10-CM | POA: Diagnosis not present

## 2017-08-12 DIAGNOSIS — Z09 Encounter for follow-up examination after completed treatment for conditions other than malignant neoplasm: Secondary | ICD-10-CM | POA: Diagnosis not present

## 2017-08-12 DIAGNOSIS — Z79899 Other long term (current) drug therapy: Secondary | ICD-10-CM | POA: Diagnosis not present

## 2017-08-12 DIAGNOSIS — Z96652 Presence of left artificial knee joint: Secondary | ICD-10-CM | POA: Diagnosis not present

## 2017-08-12 DIAGNOSIS — I739 Peripheral vascular disease, unspecified: Secondary | ICD-10-CM | POA: Diagnosis not present

## 2017-08-15 DIAGNOSIS — I779 Disorder of arteries and arterioles, unspecified: Secondary | ICD-10-CM | POA: Diagnosis not present

## 2017-08-15 DIAGNOSIS — T8454XS Infection and inflammatory reaction due to internal left knee prosthesis, sequela: Secondary | ICD-10-CM | POA: Diagnosis not present

## 2017-08-15 DIAGNOSIS — G4700 Insomnia, unspecified: Secondary | ICD-10-CM | POA: Diagnosis not present

## 2017-08-15 DIAGNOSIS — Z4789 Encounter for other orthopedic aftercare: Secondary | ICD-10-CM | POA: Diagnosis not present

## 2017-08-15 DIAGNOSIS — Z01818 Encounter for other preprocedural examination: Secondary | ICD-10-CM | POA: Diagnosis not present

## 2017-08-15 DIAGNOSIS — R0683 Snoring: Secondary | ICD-10-CM | POA: Diagnosis not present

## 2017-08-15 DIAGNOSIS — Z720 Tobacco use: Secondary | ICD-10-CM | POA: Diagnosis not present

## 2017-08-15 DIAGNOSIS — I70248 Atherosclerosis of native arteries of left leg with ulceration of other part of lower left leg: Secondary | ICD-10-CM | POA: Diagnosis not present

## 2017-08-15 DIAGNOSIS — I10 Essential (primary) hypertension: Secondary | ICD-10-CM | POA: Diagnosis not present

## 2017-08-15 DIAGNOSIS — Z9189 Other specified personal risk factors, not elsewhere classified: Secondary | ICD-10-CM | POA: Diagnosis not present

## 2017-08-18 DIAGNOSIS — Z79899 Other long term (current) drug therapy: Secondary | ICD-10-CM | POA: Diagnosis not present

## 2017-08-20 DIAGNOSIS — Z7982 Long term (current) use of aspirin: Secondary | ICD-10-CM | POA: Diagnosis not present

## 2017-08-20 DIAGNOSIS — Z452 Encounter for adjustment and management of vascular access device: Secondary | ICD-10-CM | POA: Diagnosis not present

## 2017-08-20 DIAGNOSIS — I771 Stricture of artery: Secondary | ICD-10-CM | POA: Diagnosis not present

## 2017-08-20 DIAGNOSIS — I70202 Unspecified atherosclerosis of native arteries of extremities, left leg: Secondary | ICD-10-CM | POA: Diagnosis not present

## 2017-08-20 DIAGNOSIS — I70212 Atherosclerosis of native arteries of extremities with intermittent claudication, left leg: Secondary | ICD-10-CM | POA: Diagnosis not present

## 2017-08-20 DIAGNOSIS — Z79899 Other long term (current) drug therapy: Secondary | ICD-10-CM | POA: Diagnosis not present

## 2017-08-20 DIAGNOSIS — Z888 Allergy status to other drugs, medicaments and biological substances status: Secondary | ICD-10-CM | POA: Diagnosis not present

## 2017-08-20 DIAGNOSIS — Z96652 Presence of left artificial knee joint: Secondary | ICD-10-CM | POA: Diagnosis not present

## 2017-08-20 DIAGNOSIS — Z79891 Long term (current) use of opiate analgesic: Secondary | ICD-10-CM | POA: Diagnosis not present

## 2017-08-20 DIAGNOSIS — Z8249 Family history of ischemic heart disease and other diseases of the circulatory system: Secondary | ICD-10-CM | POA: Diagnosis not present

## 2017-08-20 DIAGNOSIS — Z7901 Long term (current) use of anticoagulants: Secondary | ICD-10-CM | POA: Diagnosis not present

## 2017-08-20 DIAGNOSIS — K219 Gastro-esophageal reflux disease without esophagitis: Secondary | ICD-10-CM | POA: Diagnosis not present

## 2017-08-20 DIAGNOSIS — S31104A Unspecified open wound of abdominal wall, left lower quadrant without penetration into peritoneal cavity, initial encounter: Secondary | ICD-10-CM | POA: Diagnosis not present

## 2017-08-20 DIAGNOSIS — I10 Essential (primary) hypertension: Secondary | ICD-10-CM | POA: Diagnosis not present

## 2017-08-20 DIAGNOSIS — F17200 Nicotine dependence, unspecified, uncomplicated: Secondary | ICD-10-CM | POA: Diagnosis not present

## 2017-08-20 DIAGNOSIS — Z833 Family history of diabetes mellitus: Secondary | ICD-10-CM | POA: Diagnosis not present

## 2017-08-20 DIAGNOSIS — M109 Gout, unspecified: Secondary | ICD-10-CM | POA: Diagnosis not present

## 2017-08-20 DIAGNOSIS — Z7984 Long term (current) use of oral hypoglycemic drugs: Secondary | ICD-10-CM | POA: Diagnosis not present

## 2017-08-20 DIAGNOSIS — I70248 Atherosclerosis of native arteries of left leg with ulceration of other part of lower left leg: Secondary | ICD-10-CM | POA: Diagnosis not present

## 2017-08-20 DIAGNOSIS — E1151 Type 2 diabetes mellitus with diabetic peripheral angiopathy without gangrene: Secondary | ICD-10-CM | POA: Diagnosis not present

## 2017-08-20 DIAGNOSIS — L97828 Non-pressure chronic ulcer of other part of left lower leg with other specified severity: Secondary | ICD-10-CM | POA: Diagnosis not present

## 2017-08-20 DIAGNOSIS — E785 Hyperlipidemia, unspecified: Secondary | ICD-10-CM | POA: Diagnosis not present

## 2017-08-20 DIAGNOSIS — Z993 Dependence on wheelchair: Secondary | ICD-10-CM | POA: Diagnosis not present

## 2017-08-25 DIAGNOSIS — Z79899 Other long term (current) drug therapy: Secondary | ICD-10-CM | POA: Diagnosis not present

## 2017-08-28 ENCOUNTER — Other Ambulatory Visit: Payer: Self-pay | Admitting: *Deleted

## 2017-08-28 NOTE — Patient Outreach (Signed)
[place] [facility]) Care Management  08/28/2017  ANTHEM FRAZER 05-Nov-1951 [id number]   Referral from [place] to engage for [facility] care management services Diagnosis : Recent discharge from [facility], Left femoral endarterectomy. Discharged home on 8/25 with home health services.  Placed call to patient contact number,no answer able to leave a HIPAA compliant message requesting a return call.   Plan Will await return call for today , if no response will plan call next business day.   Joylene Draft, RN, [place] Management Coordinator  [phone]- Mobile [phone]- Toll Free Main Office

## 2017-08-29 ENCOUNTER — Ambulatory Visit: Payer: Self-pay | Admitting: *Deleted

## 2017-08-29 ENCOUNTER — Other Ambulatory Visit: Payer: Self-pay | Admitting: *Deleted

## 2017-08-29 NOTE — Patient Outreach (Signed)
[place] [facility]) Care Management  08/29/2017  Erik Martin 28-Aug-1951 [id number]  2nd Telephone attempt  Unsuccessful attempt to contact patient. I was able to leave a HIPAA compliant message requesting a return call.   Plan Will plan return call to patient in the next to engage to [facility] care management services.   Joylene Draft, RN, [place] Management Coordinator  [phone]- Mobile [phone]- Toll Free Main Office

## 2017-08-30 DIAGNOSIS — Z96652 Presence of left artificial knee joint: Secondary | ICD-10-CM | POA: Diagnosis not present

## 2017-08-31 DIAGNOSIS — Z96652 Presence of left artificial knee joint: Secondary | ICD-10-CM | POA: Diagnosis not present

## 2017-08-31 DIAGNOSIS — L03116 Cellulitis of left lower limb: Secondary | ICD-10-CM | POA: Diagnosis not present

## 2017-08-31 DIAGNOSIS — S81002S Unspecified open wound, left knee, sequela: Secondary | ICD-10-CM | POA: Diagnosis not present

## 2017-09-02 DIAGNOSIS — Z96652 Presence of left artificial knee joint: Secondary | ICD-10-CM | POA: Diagnosis not present

## 2017-09-02 DIAGNOSIS — L03116 Cellulitis of left lower limb: Secondary | ICD-10-CM | POA: Diagnosis not present

## 2017-09-03 DIAGNOSIS — G8929 Other chronic pain: Secondary | ICD-10-CM | POA: Diagnosis not present

## 2017-09-03 DIAGNOSIS — Z7984 Long term (current) use of oral hypoglycemic drugs: Secondary | ICD-10-CM | POA: Diagnosis not present

## 2017-09-03 DIAGNOSIS — I10 Essential (primary) hypertension: Secondary | ICD-10-CM | POA: Diagnosis not present

## 2017-09-03 DIAGNOSIS — T8454XD Infection and inflammatory reaction due to internal left knee prosthesis, subsequent encounter: Secondary | ICD-10-CM | POA: Diagnosis not present

## 2017-09-03 DIAGNOSIS — E119 Type 2 diabetes mellitus without complications: Secondary | ICD-10-CM | POA: Diagnosis not present

## 2017-09-03 DIAGNOSIS — S81002S Unspecified open wound, left knee, sequela: Secondary | ICD-10-CM | POA: Diagnosis not present

## 2017-09-03 DIAGNOSIS — Z89522 Acquired absence of left knee: Secondary | ICD-10-CM | POA: Diagnosis not present

## 2017-09-03 DIAGNOSIS — Z792 Long term (current) use of antibiotics: Secondary | ICD-10-CM | POA: Diagnosis not present

## 2017-09-03 DIAGNOSIS — F17210 Nicotine dependence, cigarettes, uncomplicated: Secondary | ICD-10-CM | POA: Diagnosis not present

## 2017-09-03 DIAGNOSIS — S81002A Unspecified open wound, left knee, initial encounter: Secondary | ICD-10-CM | POA: Diagnosis not present

## 2017-09-03 DIAGNOSIS — T8189XA Other complications of procedures, not elsewhere classified, initial encounter: Secondary | ICD-10-CM | POA: Diagnosis not present

## 2017-09-03 DIAGNOSIS — E785 Hyperlipidemia, unspecified: Secondary | ICD-10-CM | POA: Diagnosis not present

## 2017-09-05 ENCOUNTER — Other Ambulatory Visit: Payer: Self-pay | Admitting: *Deleted

## 2017-09-05 ENCOUNTER — Encounter: Payer: Self-pay | Admitting: *Deleted

## 2017-09-05 DIAGNOSIS — I70202 Unspecified atherosclerosis of native arteries of extremities, left leg: Secondary | ICD-10-CM

## 2017-09-05 DIAGNOSIS — Z96652 Presence of left artificial knee joint: Secondary | ICD-10-CM

## 2017-09-05 NOTE — Patient Outreach (Signed)
[facility]) Care Management  09/05/2017  Erik Martin 1951/01/21 [id number]   [id number] Patient 66 year old Erik, with recent Left fem endarectomy /angioplasty for left femerol artery occulusion discharge 8/25 from [facility] . Hx of  Left TKA, Non healing wound to left knee, hardware removal, VAC placement .  Placed screening call for [facility] care management involvement, explained reason for call and transition of care program as a benefit of his [employer] insurance. Patient gave verbal agreement to participate in  program. Patient request return later this morning to be able to speak with his wife, Erik Martin as she usually answers questions over the phone due his hard of hearing. Patient discussed visit on 9/5 with vascular doctor, orthopedic doctor and plastic surgeon, patient reports plans for additional surgery to clean up wound in the next week.  Patient discussed he has [place] that visit 3 times a week to change VAC .  1320 Returned call able speak with Erik Martin , hipaa information verified, she discussed patient upcoming visits on next week with infectious disease on 9/11, Preop visits on 9/12 and pending surgery on 9/14 to left knee area that she states will involve, orthopedic, vascular and plastic surgery. Wife reports planned overnight stay after surgery at [facility] discussed she uses local [employer] transportation service for medical appointments. Wife discussed patient sleeping in recliner chair due to decreased mobility and restrictions of non weight bearing to left leg. She described, left knee stays straight because it does not able to bend , because he does not have a knee joint,  he has wound VAC to area and PICC line in place for antibiotics.  Wife reports she manages patient medications daily, she is unable review medication list at this time , she reports she needs to makes other calls related to rearranging  patient visit on next week, including PCP visit .She is agreeable to return call on next week to continue  transition of care initial assessment .   Plan Patient will receive weekly transition of care calls, next call on next week Will send CMA in basket to make patient [facility] active, Will send MD  Barrier letter.   Joylene Draft, RN, [place] Management Coordinator  [phone]- Mobile [phone]- Toll Free Main Office

## 2017-09-08 ENCOUNTER — Other Ambulatory Visit: Payer: Self-pay | Admitting: *Deleted

## 2017-09-08 DIAGNOSIS — D509 Iron deficiency anemia, unspecified: Secondary | ICD-10-CM | POA: Diagnosis not present

## 2017-09-08 DIAGNOSIS — T8454XA Infection and inflammatory reaction due to internal left knee prosthesis, initial encounter: Secondary | ICD-10-CM | POA: Diagnosis not present

## 2017-09-08 DIAGNOSIS — E1140 Type 2 diabetes mellitus with diabetic neuropathy, unspecified: Secondary | ICD-10-CM | POA: Diagnosis not present

## 2017-09-08 DIAGNOSIS — Z79899 Other long term (current) drug therapy: Secondary | ICD-10-CM | POA: Diagnosis not present

## 2017-09-08 DIAGNOSIS — I10 Essential (primary) hypertension: Secondary | ICD-10-CM | POA: Diagnosis not present

## 2017-09-08 DIAGNOSIS — I70202 Unspecified atherosclerosis of native arteries of extremities, left leg: Secondary | ICD-10-CM | POA: Diagnosis not present

## 2017-09-08 DIAGNOSIS — Z09 Encounter for follow-up examination after completed treatment for conditions other than malignant neoplasm: Secondary | ICD-10-CM | POA: Diagnosis not present

## 2017-09-08 DIAGNOSIS — E782 Mixed hyperlipidemia: Secondary | ICD-10-CM | POA: Diagnosis not present

## 2017-09-08 DIAGNOSIS — M545 Low back pain: Secondary | ICD-10-CM | POA: Diagnosis not present

## 2017-09-08 DIAGNOSIS — B9561 Methicillin susceptible Staphylococcus aureus infection as the cause of diseases classified elsewhere: Secondary | ICD-10-CM | POA: Diagnosis not present

## 2017-09-08 DIAGNOSIS — Z6836 Body mass index (BMI) 36.0-36.9, adult: Secondary | ICD-10-CM | POA: Diagnosis not present
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
## 2017-09-09 ENCOUNTER — Telehealth: Payer: Self-pay

## 2017-09-09 ENCOUNTER — Other Ambulatory Visit: Payer: Self-pay | Admitting: *Deleted

## 2017-09-09 ENCOUNTER — Ambulatory Visit (INDEPENDENT_AMBULATORY_CARE_PROVIDER_SITE_OTHER): Payer: PPO | Admitting: Internal Medicine

## 2017-09-09 VITALS — BP 106/64 | HR 78 | Temp 98.2°F

## 2017-09-09 DIAGNOSIS — A4901 Methicillin susceptible Staphylococcus aureus infection, unspecified site: Secondary | ICD-10-CM | POA: Diagnosis not present

## 2017-09-09 DIAGNOSIS — I739 Peripheral vascular disease, unspecified: Secondary | ICD-10-CM | POA: Diagnosis not present

## 2017-09-09 DIAGNOSIS — T8450XS Infection and inflammatory reaction due to unspecified internal joint prosthesis, sequela: Secondary | ICD-10-CM

## 2017-09-09 NOTE — Progress Notes (Signed)
RFV: follow up for MSSA PJI s/p 2 staged revision, currently abtx spacer.   Patient ID: Erik Martin, male   DOB: 13-Jun-1951, 66 y.o.   MRN: [id number]  HPI Erik Martin is a 66 y.o. Male with DM, PVD, tobacco use, and hx of left knee OA s/p TKA by Dr Adin Hector from [place] in early May c/b the development a small ulcer to anterior-inferior knee for which he was being seen at wound care for debridement. Ultimately wound continued to worsen with purulent drainage with concern that it extended to his joint. In late July he underwent 2 staged revision with removal of original prosthesis and abtx spacer placement. He was referred to me at this time for the management of his MSSA PJI while concurrently being referred to [facility] ortho and vascular surgeyr. He was placed on cefazolin 2gm IV Q 8hr for which he has been taking for the last 6 wk to end on 9/13. In the meantime, he underwent vascular studies which revealed occlusion of Left SFA/CFA. He has seen Dr Nicola Girt, Redmond Pulling, and [facility] for further management of his left leg wound. In the meantime, he has open knee wound measuring 15.5cm in length, 4cm at widest part, with serial wound vac change. They report that the wound is less deep. He has upcoming surgery on 9/14 which sounds like a debridement of the area for anticipated gastroc flap surgery. The family reports that they are no longer seeing dr Adin Hector, and I suspect Dr Redmond Pulling will be the one to do the new implant.  On 8/22 he underwent the following: 1. Left external iliac artery, common femoral artery, proximal profunda femoral artery, and proximal superficial femoral artery thromboendarterectomy with bovine pericardial patch angioplasty. 2. Incisional vacuum closure bandage left groin incision.  His left groin incision is well healed  He is still on cefazolin which continues via RUE picc line. He continues MWF vac changes. He has well controlled DM with HgbA1c of 6 and has cutdown his smoking  considerably and now endorses - 3 cigarettes daily. He is here today with his wife.    Outpatient Encounter Prescriptions as of 09/09/2017  Medication Sig  . amLODipine (NORVASC) 10 MG tablet   . aspirin EC 81 MG tablet Take 81 mg by mouth.  Marland Kitchen atorvastatin (LIPITOR) 20 MG tablet Take 20 mg by mouth daily.  Marland Kitchen ceFAZolin (ANCEF) IVPB Inject into the vein every 8 (eight) hours.  . Coenzyme Q10 (COQ10) 200 MG CAPS Take 200 mg by mouth.  . diphenhydrAMINE (BENADRYL) 25 mg capsule Take 25 mg by mouth every 6 (six) hours as needed.  . enoxaparin (LOVENOX) 30 MG/0.3ML injection   . fenofibrate micronized (LOFIBRA) 134 MG capsule Take 134 mg by mouth daily before breakfast.  . ferrous sulfate 325 (65 FE) MG tablet Take 325 mg by mouth.  . Flaxseed, Linseed, (FLAX SEED OIL PO) Take by mouth.  . hydrocortisone cream 1 % Apply 1 application topically 2 (two) times daily.  Marland Kitchen lisinopril (PRINIVIL,ZESTRIL) 20 MG tablet Take 20 mg by mouth daily.  Marland Kitchen MAGNESIUM OXIDE PO Take 500 mg by mouth daily.   . metFORMIN (GLUCOPHAGE) 1000 MG tablet Take 1 tablet (1,000 mg total) by mouth 2 (two) times daily with a meal.  . oxyCODONE (OXYCONTIN) 10 mg 12 hr tablet   . pantoprazole (PROTONIX) 40 MG tablet Take 40 mg by mouth daily as needed (acid reflux).  . pioglitazone (ACTOS) 45 MG tablet Take 45 mg by mouth.  . rosuvastatin (  CRESTOR) 20 MG tablet   . vitamin B-12 (CYANOCOBALAMIN) 1000 MCG tablet Take 2,000 mcg by mouth daily.   No facility-administered encounter medications on file as of 09/09/2017.      Patient Active Problem List   Diagnosis Date Noted  . History of total knee arthroplasty, 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  . 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 Anemia 11/02/2014  . Hypoglycemia 11/01/2014  . Acute renal failure ([place]) 11/01/2014  . Diabetes mellitus type 2, uncontrolled ([place]) 11/01/2014  . Anemia, blood loss 11/01/2014  . Hypertension 11/01/2014  . Hyperlipidemia 11/01/2014  . Lumbar spinal  stenosis 10/27/2014     Health Maintenance Due  Topic Date Due  . Hepatitis C Screening  30-May-1951  . FOOT EXAM  12/11/1960  . OPHTHALMOLOGY EXAM  12/11/1960  . TETANUS/TDAP  12/11/1969  . COLONOSCOPY  12/11/2000  . HEMOGLOBIN A1C  05/02/2015  . PNA vac Low Risk Adult (1 of 2 - PCV13) 12/12/2015  . INFLUENZA VACCINE  07/30/2017     Review of Systems Left knee pain. But less drainage from left knee. No fever, chills, nightsweats, diarrhea Physical Exam   BP 106/64   Pulse 78   Temp 98.2 F (36.8 C) (Oral)   Physical Exam  Constitutional: He is oriented to person, place, and time. He appears well-developed and well-nourished. No distress.  HENT:  Mouth/Throat: Oropharynx is clear and moist. No oropharyngeal exudate.  Cardiovascular: Normal rate, regular rhythm and normal heart sounds. Exam reveals no gallop and no friction rub.  No murmur heard.  Pulmonary/Chest: Effort normal and breath sounds normal. No respiratory distress. He has no wheezes.  Abd= inguinal area has well healed incision from vascular intervention Ext: left knee has wound vac in place, still appears to have some fibrinous exudate in wound bed and surrounding erythema Skin: as noted above Psychiatric: He has a normal mood and affect. His behavior is normal.    CBC Lab Results  Component Value Date   WBC 7.2 11/03/2014   RBC 3.21 (L) 11/03/2014   HGB 8.4 (L) 11/03/2014   HCT 27.5 (L) 11/03/2014   PLT 256 11/03/2014   MCV 85.7 11/03/2014   MCH 26.2 11/03/2014   MCHC 30.5 11/03/2014   RDW 15.7 (H) 11/03/2014   LYMPHSABS 1.6 11/02/2014   MONOABS 1.4 (H) 11/02/2014   EOSABS 0.2 11/02/2014    BMET Lab Results  Component Value Date   NA 138 11/04/2014   K 3.9 11/04/2014   CL 100 11/04/2014   CO2 23 11/04/2014   GLUCOSE 238 (H) 11/04/2014   BUN 21 11/04/2014   CREATININE 1.24 11/04/2014   CALCIUM 9.9 11/04/2014   GFRNONAA 60 (L) 11/04/2014   GFRAA 70 (L) 11/04/2014      Assessment and  Plan   MSSA PJI = technically finishes 6 wk on 9/13 but I am concern that his superficial wound could still represent a nidus of infection. I will plan to extend for 2 addn weeks since he is having debridement surgery this Friday at [facility]. I will ask family to ask for ID team to weigh in on management to whether it makes sense to do 2 more weeks of mop vs. Expanding abtx depending on what repeat cultures may show.  Will ask that surgery team take OR cultures of wound to ensure we are not missing any pathogens.  Will check sed rate and crp at this visit

## 2017-09-09 NOTE — Patient Outreach (Signed)
[place] [facility]) Care Management  09/09/2017  Erik Martin [birth year] [id number]  0850  Transition of care call. Spoke briefly with patient wife [name], she states she is unable to talk at this time , request call at a different time, patient has MD visit on today.    Plan Will plan return call in the next 2  days.    Joylene Draft, RN, [place] Management Coordinator  [phone]- Mobile [phone]- Toll Free Main Office

## 2017-09-09 NOTE — Telephone Encounter (Addendum)
Per verbal order from Dr. Baxter Flattery I called [facility] to extend pt's iv antibiotics for two weeks. I left a message stating the verbal order and left a call back number if there were any questions. Erik Martin

## 2017-09-10 ENCOUNTER — Other Ambulatory Visit: Payer: Self-pay | Admitting: *Deleted

## 2017-09-10 DIAGNOSIS — Z7901 Long term (current) use of anticoagulants: Secondary | ICD-10-CM | POA: Diagnosis not present

## 2017-09-10 DIAGNOSIS — T8454XD Infection and inflammatory reaction due to internal left knee prosthesis, subsequent encounter: Secondary | ICD-10-CM | POA: Diagnosis not present

## 2017-09-10 DIAGNOSIS — M109 Gout, unspecified: Secondary | ICD-10-CM | POA: Diagnosis not present

## 2017-09-10 DIAGNOSIS — E119 Type 2 diabetes mellitus without complications: Secondary | ICD-10-CM | POA: Diagnosis not present

## 2017-09-10 DIAGNOSIS — I10 Essential (primary) hypertension: Secondary | ICD-10-CM | POA: Diagnosis not present

## 2017-09-10 DIAGNOSIS — E785 Hyperlipidemia, unspecified: Secondary | ICD-10-CM | POA: Diagnosis not present

## 2017-09-10 DIAGNOSIS — Z7984 Long term (current) use of oral hypoglycemic drugs: Secondary | ICD-10-CM | POA: Diagnosis not present

## 2017-09-10 DIAGNOSIS — D649 Anemia, unspecified: Secondary | ICD-10-CM | POA: Diagnosis not present

## 2017-09-10 DIAGNOSIS — T8454XA Infection and inflammatory reaction due to internal left knee prosthesis, initial encounter: Secondary | ICD-10-CM | POA: Diagnosis not present

## 2017-09-10 DIAGNOSIS — Z7982 Long term (current) use of aspirin: Secondary | ICD-10-CM | POA: Diagnosis not present

## 2017-09-10 DIAGNOSIS — S86892S Other injury of other muscle(s) and tendon(s) at lower leg level, left leg, sequela: Secondary | ICD-10-CM | POA: Diagnosis not present

## 2017-09-10 DIAGNOSIS — Z01818 Encounter for other preprocedural examination: Secondary | ICD-10-CM | POA: Diagnosis not present

## 2017-09-10 DIAGNOSIS — S81002S Unspecified open wound, left knee, sequela: Secondary | ICD-10-CM | POA: Diagnosis not present

## 2017-09-10 DIAGNOSIS — K219 Gastro-esophageal reflux disease without esophagitis: Secondary | ICD-10-CM | POA: Diagnosis not present

## 2017-09-10 DIAGNOSIS — Z9862 Peripheral vascular angioplasty status: Secondary | ICD-10-CM | POA: Diagnosis not present

## 2017-09-10 DIAGNOSIS — F17210 Nicotine dependence, cigarettes, uncomplicated: Secondary | ICD-10-CM | POA: Diagnosis not present

## 2017-09-10 LAB — SEDIMENTATION RATE: Sed Rate: 38 mm/h — ABNORMAL HIGH (ref 0–20)

## 2017-09-10 LAB — C-REACTIVE PROTEIN: CRP: 16.5 mg/L — ABNORMAL HIGH (ref <8.0)

## 2017-09-10 NOTE — Telephone Encounter (Signed)
Order to extend antibiotics called to [facility] at [phone]. Called to [facility] outpatient in error. Requested office note and faxed to [phone].

## 2017-09-10 NOTE — Patient Outreach (Addendum)
[place] [facility]) Care Management  09/10/2017  DARE SANGER [birth year] [id number]   Placed returned call to patient , unsuccessful no answer , able to leave a hipaa compliant message requesting a return call.  Noted previous conversation with patient wife that discussed several medical appointments that patient has this week in preparation for surgical procedure on 9/14.    Plan Will await return call, if no response will plan call in the next week.    Joylene Draft, RN, [place] Management Coordinator  [phone]- Mobile [phone]- Toll Free Main Office

## 2017-09-12 DIAGNOSIS — T8454XA Infection and inflammatory reaction due to internal left knee prosthesis, initial encounter: Secondary | ICD-10-CM | POA: Diagnosis not present

## 2017-09-12 DIAGNOSIS — B9561 Methicillin susceptible Staphylococcus aureus infection as the cause of diseases classified elsewhere: Secondary | ICD-10-CM | POA: Diagnosis not present

## 2017-09-12 DIAGNOSIS — M00062 Staphylococcal arthritis, left knee: Secondary | ICD-10-CM | POA: Diagnosis not present

## 2017-09-12 DIAGNOSIS — E119 Type 2 diabetes mellitus without complications: Secondary | ICD-10-CM | POA: Diagnosis not present

## 2017-09-12 DIAGNOSIS — S81022A Laceration with foreign body, left knee, initial encounter: Secondary | ICD-10-CM | POA: Diagnosis not present

## 2017-09-16 DIAGNOSIS — T8454XA Infection and inflammatory reaction due to internal left knee prosthesis, initial encounter: Secondary | ICD-10-CM | POA: Diagnosis not present

## 2017-09-16 DIAGNOSIS — B9561 Methicillin susceptible Staphylococcus aureus infection as the cause of diseases classified elsewhere: Secondary | ICD-10-CM | POA: Diagnosis not present

## 2017-09-17 ENCOUNTER — Other Ambulatory Visit: Payer: Self-pay | Admitting: *Deleted

## 2017-09-17 NOTE — Patient Outreach (Signed)
[place] [facility]) Care Management  09/17/2017  Erik Martin [birth year] [id number]   Transition of care call  Spoke with patient wife Erik Martin on patient behalf. She reports patient is currently getting wound vac dressing change done. Wife discussed patient recent outpatient surgery for debridement of left knee wound. She discussed patient had new  PICC line placed due to concern related to infection at site. Patient has also been started on a new IV  antibiotic for the next 2 weeks   they now have and being administered every 8 hours, wife states she and daughter are able to do this. She discuss patient will require follow up surgery in the next couple of weeks.  Wife discussed patient is working on cutting back on smoking , he has used nicotine in the past and will plan to try it again to help.   Wife states patient continues to stay in recliner chair, discussed lift chair would be beneficial to help him with standing up on the right leg, she states they have been looking around for one but discussed cost concern . Reports social worker with home health has also looked into resources for chair.  Plan Wife agreeable to home visit as part of transition of care  program , will plan visit in the next week.     Joylene Draft, RN, [place] Management Coordinator  [phone]- Mobile [phone]- Toll Free Main Office

## 2017-09-19 DIAGNOSIS — Z96652 Presence of left artificial knee joint: Secondary | ICD-10-CM | POA: Diagnosis not present

## 2017-09-19 DIAGNOSIS — L03116 Cellulitis of left lower limb: Secondary | ICD-10-CM | POA: Diagnosis not present

## 2017-09-22 DIAGNOSIS — Z792 Long term (current) use of antibiotics: Secondary | ICD-10-CM | POA: Diagnosis not present

## 2017-09-23 ENCOUNTER — Other Ambulatory Visit: Payer: Self-pay | Admitting: *Deleted

## 2017-09-23 ENCOUNTER — Encounter: Payer: Self-pay | Admitting: *Deleted

## 2017-09-23 NOTE — Patient Outreach (Signed)
[place] [facility]) Care Management   09/23/2017  Erik Martin 08-22-1951 [id number]  Erik Martin is an 66 y.o. male  Subjective:  Reports doing about the same, this leg has gotten me down .   Objective:   height is 1.727 m (5\' 8" ) and weight is 248 lb (112.5 kg). His blood pressure is 110/60 and his pulse is 82. His respiration is 18 and oxygen saturation is 97%.  Patient sitting in wheelchair Review of Systems  Constitutional: Negative.   HENT: Negative.   Eyes: Negative.   Respiratory: Negative.   Cardiovascular: Positive for leg swelling.       Bilateral lower  leg swelling , left greater than righ  Gastrointestinal: Negative.   Genitourinary: Negative.   Musculoskeletal: Positive for back pain and joint pain.  Skin: Negative.   Neurological: Negative.   Endo/Heme/Allergies: Negative.   Psychiatric/Behavioral: Negative.     Physical Exam  Constitutional: He is oriented to person, place, and time. He appears well-developed and well-nourished.  Cardiovascular: Normal rate and normal heart sounds.   Respiratory: Effort normal and breath sounds normal.  GI: Soft.  Neurological: He is alert and oriented to person, place, and time.  Skin: Skin is warm and dry.     Bilateral lower leg swelling and reddish discoloration  Psychiatric: He has a normal mood and affect. His behavior is normal. Judgment and thought content normal.    Encounter Medications:   Outpatient Encounter Prescriptions as of 09/23/2017  Medication Sig Note  . amLODipine (NORVASC) 10 MG tablet    . aspirin EC 81 MG tablet Take 81 mg by mouth.   . Coenzyme Q10 (COQ10) 200 MG CAPS Take 200 mg by mouth.   . ferrous sulfate 325 (65 FE) MG tablet Take 325 mg by mouth.   . Flaxseed, Linseed, (FLAX SEED OIL PO) Take 1,200 capsules by mouth 3 (three) times daily.    . hydrocortisone cream 1 % Apply 1 application topically 2 (two) times daily.   [place] Kitchen lisinopril (PRINIVIL,ZESTRIL) 20 MG tablet Take 20  mg by mouth daily.   [place] Kitchen MAGNESIUM OXIDE PO Take 500 mg by mouth daily.    . Melatonin 5 MG TABS Take 5 tablets by mouth.   . metFORMIN (GLUCOPHAGE) 1000 MG tablet Take 1 tablet (1,000 mg total) by mouth 2 (two) times daily with a meal.   . ondansetron (ZOFRAN) 4 MG tablet Take 4 mg by mouth every 8 (eight) hours as needed for nausea or vomiting. Every 6 to 8 hours prn   . oxyCODONE (OXYCONTIN) 10 mg 12 hr tablet every 12 (twelve) hours.    . Oxycodone HCl 10 MG TABS Take 10 mg by mouth every 6 (six) hours as needed. Breakthrough pain   . pantoprazole (PROTONIX) 40 MG tablet Take 40 mg by mouth daily as needed (acid reflux).   . pioglitazone (ACTOS) 45 MG tablet Take 45 mg by mouth.   . rosuvastatin (CRESTOR) 20 MG tablet daily.    . vitamin B-12 (CYANOCOBALAMIN) 1000 MCG tablet Take 2,000 mcg by mouth daily.   . Zinc Sulfate (ZINC-220 PO) Take 220 tablets by mouth daily.   Marland Kitchen atorvastatin (LIPITOR) 20 MG tablet Take 20 mg by mouth daily. 09/23/2017: Not taking , MD has discontinued   . ceFAZolin (ANCEF) IVPB Inject into the vein every 8 (eight) hours.   . diphenhydrAMINE (BENADRYL) 25 mg capsule Take 25 mg by mouth every 6 (six) hours as needed. 09/23/2017: Not taking   .  enoxaparin (LOVENOX) 30 MG/0.3ML injection  09/23/2017: Not taking   . fenofibrate micronized (LOFIBRA) 134 MG capsule Take 134 mg by mouth daily before breakfast. 09/23/2017: Not taking , MD discontinued    No facility-administered encounter medications on file as of 09/23/2017.     Functional Status:   In your present state of health, do you have any difficulty performing the following activities: 09/05/2017  Hearing? Y  Vision? N  Difficulty concentrating or making decisions? N  Walking or climbing stairs? Y  Dressing or bathing? Y  Doing errands, shopping? Y  Comment uses Location manager and eating ? Y  Using the Toilet? Y  In the past six months, have you accidently leaked urine? N  Do you have  problems with loss of bowel control? N  Managing your Medications? Y  Comment wife assist  Managing your Finances? Y  Comment wife assist   Housekeeping or managing your Housekeeping? Y  Comment wife helps  Some recent data might be hidden    Fall/Depression Screening:    Fall Risk  09/23/2017 09/09/2017 08/04/2017  Falls in the past year? No No No  Risk for fall due to : Impaired balance/gait;Impaired mobility - Impaired balance/gait;Impaired mobility   PHQ 2/9 Scores 09/23/2017 09/09/2017 08/04/2017  PHQ - 2 Score 1 1 1     Assessment:  Initial home visit ,  1.Transition of care -  Reviewed transition of care program, Provided [facility] welcome packet,consent signed.   2.Left Knee wound -  wound VAC dressing change MWF by home health service, following up with Medical team in [facility], Vascular, Orthopedic,Plastic and infections disease. Keeps left leg elevated while sitting.  3.Diabetes - Recent Alc 6.2 on 9/10 This morning blood sugar 135, checks blood sugar twice daily. Watching diet limits sweets, fried foods, no salt,  4.High Fall Risk - limited mobility, only stands on  right leg to pivot from chair to chair, stands up off and on during the day . No weight bearing on left leg.   5.Cigarette Smoking -  Working on quitting smoking, by reducing amount he smokes daily, down to less than 1/2 pack per day. 6..Patient was recently discharged from hospital and all medications have been reviewed. Patients' wife manages daily  Medication including assisting with IV antibiotic Cefepime every 8 hours via PICC.     Plan:  1. Consent will be scanned in medical record, Will continue weekly transition of care outreach. 2. Reinforced continued follow with medical appointments,  3. Reviewed continual monitoring of blood sugar, reinforced healthy balanced diet ,  reviewed rule of 15  4. Reviewed fall precautions measures 5. Provided and reviewed EMMI handout on  Quitting cigarette smoking in older  adults  6. Provided pill organizer for daily use    Care planning and goal setting during home visit. Will send visit note to MD.   [facility] CM Care Plan Problem One     Most Recent Value  Care Plan Problem One  Recent hospital admission related to Left femoral endatrectomy,Left knee wound  Role Documenting the Problem One  Care Management [place] for Problem One  Active  [facility] Long Term Goal   Patient will not experience hospital admission in the next 31 days   [facility] Long Term Goal Start Date  09/05/17  Interventions for Problem One Long Term Goal  Home visited completed   [facility] CM Short Term Goal #1   Patient attend all medical appointments in the next  30 days   [facility] CM Short Term Goal #1 Start Date  09/05/17  Interventions for Short Term Goal #1  Reviewed upcoming medical appointments, verified transportation   [facility] CM Short Term Goal #2   Patient/caregiver will report signs of infection to notify MD of in the next 30 days   [facility] CM Short Term Goal #2 Start Date  09/05/17  Interventions for Short Term Goal #2  Reinforced  signs/symptoms of infection , elevated temperature, increased redness at wound site, change in drainage color, purulent, noted bleeding  [facility]N CM Short Term Goal #3  Patient will continue to monitor and record blood sugars at least 2 times daily in the next 30 days   [facility]N CM Short Term Goal #3 Start Date  09/23/17  Interventions for Short Tern Goal #3  Advised regarding importance of keeping blood sugar under control, and monitoring and keeping a record, provided [facility] calendar to log readings   [facility]N CM Short Term Goal #4  Patient will report continued wound healing in the next 30 days   [facility]N CM Short Term Goal #4 Start Date  09/23/17  Interventions for Short Term Goal #4  Advised regarding measures to promote wound healing, eating healthy balanced diet, taking medicaitons as prescribed,, controlling blood sugar, discussed measure to       Joylene Draft, RN, [place] Management Coordinator  [phone]- Mobile [phone]- [place]

## 2017-09-24 DIAGNOSIS — Z23 Encounter for immunization: Secondary | ICD-10-CM | POA: Diagnosis not present

## 2017-09-24 DIAGNOSIS — S81002A Unspecified open wound, left knee, initial encounter: Secondary | ICD-10-CM | POA: Diagnosis not present

## 2017-09-24 DIAGNOSIS — Z792 Long term (current) use of antibiotics: Secondary | ICD-10-CM | POA: Diagnosis not present

## 2017-09-24 DIAGNOSIS — A4901 Methicillin susceptible Staphylococcus aureus infection, unspecified site: Secondary | ICD-10-CM | POA: Diagnosis not present

## 2017-09-24 DIAGNOSIS — T8454XA Infection and inflammatory reaction due to internal left knee prosthesis, initial encounter: Secondary | ICD-10-CM | POA: Diagnosis not present

## 2017-09-24 DIAGNOSIS — Z5181 Encounter for therapeutic drug level monitoring: Secondary | ICD-10-CM | POA: Diagnosis not present

## 2017-09-24 DIAGNOSIS — T8454XD Infection and inflammatory reaction due to internal left knee prosthesis, subsequent encounter: Secondary | ICD-10-CM | POA: Diagnosis not present

## 2017-09-24 DIAGNOSIS — M00062 Staphylococcal arthritis, left knee: Secondary | ICD-10-CM | POA: Diagnosis not present

## 2017-09-24 DIAGNOSIS — B9561 Methicillin susceptible Staphylococcus aureus infection as the cause of diseases classified elsewhere: Secondary | ICD-10-CM | POA: Diagnosis not present

## 2017-09-24 DIAGNOSIS — B965 Pseudomonas (aeruginosa) (mallei) (pseudomallei) as the cause of diseases classified elsewhere: Secondary | ICD-10-CM | POA: Diagnosis not present

## 2017-09-26 ENCOUNTER — Other Ambulatory Visit: Payer: Self-pay | Admitting: Pharmacist

## 2017-09-28 DIAGNOSIS — Z7984 Long term (current) use of oral hypoglycemic drugs: Secondary | ICD-10-CM | POA: Diagnosis not present

## 2017-09-28 DIAGNOSIS — Z7901 Long term (current) use of anticoagulants: Secondary | ICD-10-CM | POA: Diagnosis not present

## 2017-09-28 DIAGNOSIS — E669 Obesity, unspecified: Secondary | ICD-10-CM | POA: Diagnosis not present

## 2017-09-28 DIAGNOSIS — Z452 Encounter for adjustment and management of vascular access device: Secondary | ICD-10-CM | POA: Diagnosis not present

## 2017-09-28 DIAGNOSIS — T8454XA Infection and inflammatory reaction due to internal left knee prosthesis, initial encounter: Secondary | ICD-10-CM | POA: Diagnosis not present

## 2017-09-28 DIAGNOSIS — I10 Essential (primary) hypertension: Secondary | ICD-10-CM | POA: Diagnosis not present

## 2017-09-28 DIAGNOSIS — Z6837 Body mass index (BMI) 37.0-37.9, adult: Secondary | ICD-10-CM | POA: Diagnosis not present

## 2017-09-28 DIAGNOSIS — Z89522 Acquired absence of left knee: Secondary | ICD-10-CM | POA: Diagnosis not present

## 2017-09-28 DIAGNOSIS — E1151 Type 2 diabetes mellitus with diabetic peripheral angiopathy without gangrene: Secondary | ICD-10-CM | POA: Diagnosis not present

## 2017-09-28 DIAGNOSIS — F17210 Nicotine dependence, cigarettes, uncomplicated: Secondary | ICD-10-CM | POA: Diagnosis not present

## 2017-09-28 DIAGNOSIS — Z79891 Long term (current) use of opiate analgesic: Secondary | ICD-10-CM | POA: Diagnosis not present

## 2017-09-28 DIAGNOSIS — B9561 Methicillin susceptible Staphylococcus aureus infection as the cause of diseases classified elsewhere: Secondary | ICD-10-CM | POA: Diagnosis not present

## 2017-09-28 DIAGNOSIS — Z9181 History of falling: Secondary | ICD-10-CM | POA: Diagnosis not present

## 2017-09-28 DIAGNOSIS — Z792 Long term (current) use of antibiotics: Secondary | ICD-10-CM | POA: Diagnosis not present

## 2017-09-28 DIAGNOSIS — Z48815 Encounter for surgical aftercare following surgery on the digestive system: Secondary | ICD-10-CM | POA: Diagnosis not present

## 2017-09-29 DIAGNOSIS — Z96652 Presence of left artificial knee joint: Secondary | ICD-10-CM | POA: Diagnosis not present

## 2017-09-30 ENCOUNTER — Other Ambulatory Visit: Payer: Self-pay | Admitting: *Deleted

## 2017-09-30 DIAGNOSIS — S81002S Unspecified open wound, left knee, sequela: Secondary | ICD-10-CM | POA: Diagnosis not present

## 2017-09-30 DIAGNOSIS — Z96652 Presence of left artificial knee joint: Secondary | ICD-10-CM | POA: Diagnosis not present

## 2017-09-30 DIAGNOSIS — L03116 Cellulitis of left lower limb: Secondary | ICD-10-CM | POA: Diagnosis not present

## 2017-09-30 NOTE — Patient Outreach (Addendum)
[place] [facility]) Care Management  09/30/2017  Erik Martin May 13, 1951 [id number]   Transition of care   Placed call to patient able to speak with his wife Erik Martin, she reports patient is doing pretty good . She discussed patient visit to plastic surgeon office on yesterday, healing at site pink tissue. Wife discussed patient now has a quit smoking date as of 10/2, he has decided  to quit to help with healing in  upcoming staged wound care surgeries . Patient has nicorette  lozenges for use   Wife denies any new concerns at this time, wound Vac dressing changed at MD visit on yesterday, patient continues with IV antibiotics for at least 3 more weeks.  Plan Will plan transition of care call in the next week.   Joylene Draft, RN, [place] Management Coordinator  [phone]- Mobile [phone]- Toll Free Main Office

## 2017-10-01 DIAGNOSIS — Z792 Long term (current) use of antibiotics: Secondary | ICD-10-CM | POA: Diagnosis not present

## 2017-10-01 DIAGNOSIS — Z79899 Other long term (current) drug therapy: Secondary | ICD-10-CM | POA: Diagnosis not present

## 2017-10-06 ENCOUNTER — Other Ambulatory Visit: Payer: Self-pay | Admitting: *Deleted

## 2017-10-06 NOTE — Patient Outreach (Signed)
[place] [facility]) Care Management  10/06/2017  Erik Martin 1951/11/27 [id number]  Transition of care call   Placed call to patient, able to speak with his wife Erik Martin, she reports patient is hanging in there.  Patient has decreased appetite, trying nutrition supplements between meals, and varying protein balanced foods.  Patient blood sugars have been ranging between 109- 120, denies having reading of 70 or below, denies hypoglycemia symptoms.  Patient is still working on quitting, admits to having smoked a few cigarettes since quit date, he is now using nicorette gum to help.  Patient has follow up office visits with plastic surgeon in the next week, home health RN continues to change wound vac dressing 3 days a week, and patient receives IV antibiotics wife assisting.   Patient has completed 31 days of transition of care without readmission , will continue to follow for complex care management needs per program.     Plan Will schedule follow up call in the next 2 weeks, for continued complex care management follow up   Joylene Draft, RN, [place] Management Coordinator  [phone]- Mobile [phone]- [place]

## 2017-10-07 ENCOUNTER — Ambulatory Visit: Payer: PPO | Admitting: Internal Medicine

## 2017-10-08 DIAGNOSIS — T8454XA Infection and inflammatory reaction due to internal left knee prosthesis, initial encounter: Secondary | ICD-10-CM | POA: Diagnosis not present

## 2017-10-08 DIAGNOSIS — B9561 Methicillin susceptible Staphylococcus aureus infection as the cause of diseases classified elsewhere: Secondary | ICD-10-CM | POA: Diagnosis not present

## 2017-10-09 DIAGNOSIS — I739 Peripheral vascular disease, unspecified: Secondary | ICD-10-CM | POA: Diagnosis not present

## 2017-10-09 DIAGNOSIS — Z95820 Peripheral vascular angioplasty status with implants and grafts: Secondary | ICD-10-CM | POA: Diagnosis not present

## 2017-10-09 DIAGNOSIS — I10 Essential (primary) hypertension: Secondary | ICD-10-CM | POA: Diagnosis not present

## 2017-10-09 DIAGNOSIS — E119 Type 2 diabetes mellitus without complications: Secondary | ICD-10-CM | POA: Diagnosis not present

## 2017-10-13 DIAGNOSIS — K219 Gastro-esophageal reflux disease without esophagitis: Secondary | ICD-10-CM | POA: Diagnosis not present

## 2017-10-13 DIAGNOSIS — D649 Anemia, unspecified: Secondary | ICD-10-CM | POA: Diagnosis not present

## 2017-10-13 DIAGNOSIS — R112 Nausea with vomiting, unspecified: Secondary | ICD-10-CM | POA: Diagnosis not present

## 2017-10-13 DIAGNOSIS — E119 Type 2 diabetes mellitus without complications: Secondary | ICD-10-CM | POA: Diagnosis not present

## 2017-10-13 DIAGNOSIS — Z7984 Long term (current) use of oral hypoglycemic drugs: Secondary | ICD-10-CM | POA: Diagnosis not present

## 2017-10-13 DIAGNOSIS — T8454XD Infection and inflammatory reaction due to internal left knee prosthesis, subsequent encounter: Secondary | ICD-10-CM | POA: Diagnosis not present

## 2017-10-13 DIAGNOSIS — K5900 Constipation, unspecified: Secondary | ICD-10-CM | POA: Diagnosis not present

## 2017-10-13 DIAGNOSIS — E785 Hyperlipidemia, unspecified: Secondary | ICD-10-CM | POA: Diagnosis not present

## 2017-10-13 DIAGNOSIS — F17210 Nicotine dependence, cigarettes, uncomplicated: Secondary | ICD-10-CM | POA: Diagnosis not present

## 2017-10-13 DIAGNOSIS — D72819 Decreased white blood cell count, unspecified: Secondary | ICD-10-CM | POA: Diagnosis not present

## 2017-10-13 DIAGNOSIS — E441 Mild protein-calorie malnutrition: Secondary | ICD-10-CM | POA: Diagnosis not present

## 2017-10-13 DIAGNOSIS — I10 Essential (primary) hypertension: Secondary | ICD-10-CM | POA: Diagnosis not present

## 2017-10-13 DIAGNOSIS — E1129 Type 2 diabetes mellitus with other diabetic kidney complication: Secondary | ICD-10-CM | POA: Diagnosis not present

## 2017-10-13 DIAGNOSIS — M6281 Muscle weakness (generalized): Secondary | ICD-10-CM | POA: Diagnosis not present

## 2017-10-13 DIAGNOSIS — R627 Adult failure to thrive: Secondary | ICD-10-CM | POA: Diagnosis not present

## 2017-10-13 DIAGNOSIS — Z96652 Presence of left artificial knee joint: Secondary | ICD-10-CM | POA: Diagnosis not present

## 2017-10-13 DIAGNOSIS — L03116 Cellulitis of left lower limb: Secondary | ICD-10-CM | POA: Diagnosis not present

## 2017-10-13 DIAGNOSIS — Z8249 Family history of ischemic heart disease and other diseases of the circulatory system: Secondary | ICD-10-CM | POA: Diagnosis not present

## 2017-10-13 DIAGNOSIS — Z833 Family history of diabetes mellitus: Secondary | ICD-10-CM | POA: Diagnosis not present

## 2017-10-13 DIAGNOSIS — E872 Acidosis: Secondary | ICD-10-CM | POA: Diagnosis not present

## 2017-10-13 DIAGNOSIS — M009 Pyogenic arthritis, unspecified: Secondary | ICD-10-CM | POA: Diagnosis not present

## 2017-10-13 DIAGNOSIS — N170 Acute kidney failure with tubular necrosis: Secondary | ICD-10-CM | POA: Diagnosis not present

## 2017-10-13 DIAGNOSIS — R531 Weakness: Secondary | ICD-10-CM | POA: Diagnosis not present

## 2017-10-13 DIAGNOSIS — I129 Hypertensive chronic kidney disease with stage 1 through stage 4 chronic kidney disease, or unspecified chronic kidney disease: Secondary | ICD-10-CM | POA: Diagnosis not present

## 2017-10-13 DIAGNOSIS — I444 Left anterior fascicular block: Secondary | ICD-10-CM | POA: Diagnosis not present

## 2017-10-13 DIAGNOSIS — N189 Chronic kidney disease, unspecified: Secondary | ICD-10-CM | POA: Diagnosis not present

## 2017-10-13 DIAGNOSIS — Z7982 Long term (current) use of aspirin: Secondary | ICD-10-CM | POA: Diagnosis not present

## 2017-10-13 DIAGNOSIS — I70212 Atherosclerosis of native arteries of extremities with intermittent claudication, left leg: Secondary | ICD-10-CM | POA: Diagnosis not present

## 2017-10-13 DIAGNOSIS — E1151 Type 2 diabetes mellitus with diabetic peripheral angiopathy without gangrene: Secondary | ICD-10-CM | POA: Diagnosis not present

## 2017-10-13 DIAGNOSIS — B965 Pseudomonas (aeruginosa) (mallei) (pseudomallei) as the cause of diseases classified elsewhere: Secondary | ICD-10-CM | POA: Diagnosis not present

## 2017-10-13 DIAGNOSIS — N179 Acute kidney failure, unspecified: Secondary | ICD-10-CM | POA: Diagnosis not present

## 2017-10-13 DIAGNOSIS — I70209 Unspecified atherosclerosis of native arteries of extremities, unspecified extremity: Secondary | ICD-10-CM | POA: Diagnosis not present

## 2017-10-13 DIAGNOSIS — T8454XA Infection and inflammatory reaction due to internal left knee prosthesis, initial encounter: Secondary | ICD-10-CM | POA: Diagnosis not present

## 2017-10-23 ENCOUNTER — Other Ambulatory Visit: Payer: Self-pay | Admitting: *Deleted

## 2017-10-23 NOTE — Patient Outreach (Addendum)
[facility]) Care Management  10/23/2017  Erik Martin 1951/03/28 [id number]   Telephone follow up call  Placed scheduled  call to patient/wife , no answer able to leave HIPAA compliant message requesting a return call  I have noted patient admitted to [facility]  since 10/15.    Plan Will await return call, will follow progress and continued inpatient stay for case closure per program work flow if admitted greater than 10 days, will follow up on next business day.     Joylene Draft, RN, [place] Management Coordinator  [phone]- Mobile [phone]- Toll Free Main Office

## 2017-10-24 ENCOUNTER — Other Ambulatory Visit: Payer: Self-pay | Admitting: *Deleted

## 2017-10-24 ENCOUNTER — Encounter: Payer: Self-pay | Admitting: *Deleted

## 2017-10-24 NOTE — Patient Outreach (Signed)
[place] [facility]) Care Management  10/24/2017  Erik Martin 18-Nov-1951 [id number]   Patient admitted to [facility] hospital for 10 days,10/15 through 10/25,  will close case per workflow.  Plan  Will plan follow up call in the next week.regarding discharge.     Joylene Draft, RN, [place] Management Coordinator  [phone]- Mobile [phone]- Toll Free Main Office

## 2017-10-27 DIAGNOSIS — B9561 Methicillin susceptible Staphylococcus aureus infection as the cause of diseases classified elsewhere: Secondary | ICD-10-CM | POA: Diagnosis not present

## 2017-10-27 DIAGNOSIS — T8454XA Infection and inflammatory reaction due to internal left knee prosthesis, initial encounter: Secondary | ICD-10-CM | POA: Diagnosis not present

## 2017-10-28 ENCOUNTER — Ambulatory Visit: Payer: Self-pay | Admitting: *Deleted

## 2017-10-29 ENCOUNTER — Other Ambulatory Visit: Payer: Self-pay | Admitting: *Deleted

## 2017-10-29 DIAGNOSIS — B9561 Methicillin susceptible Staphylococcus aureus infection as the cause of diseases classified elsewhere: Secondary | ICD-10-CM | POA: Diagnosis not present

## 2017-10-29 NOTE — Patient Outreach (Signed)
[place] [facility] Care Management  10/29/2017  Erik Martin 1951-07-24 [id number]   Transition of care call  Follow up call on patient discharge from [place] call to patient home, person answering phone identified self as wife, [name], HIPAA information verified. Wife discussed briefly patient recent hospital admission, and discharge back to home 10/ 26 evening. She reports home health has visited on today, for wound VAC change, lab draw and hoping today is last day of IV antibiotics . Wife then requested that I return call in the next day.  Plan Will plan return call in the next day for transition of care.  Will plan to notify CMA of of discharge and when successful outreach .   Joylene Draft, RN, [place] Management Coordinator  [phone]- Mobile [phone]- Toll Free Main Office

## 2017-10-30 ENCOUNTER — Other Ambulatory Visit: Payer: Self-pay | Admitting: *Deleted

## 2017-10-30 DIAGNOSIS — S81802D Unspecified open wound, left lower leg, subsequent encounter: Secondary | ICD-10-CM | POA: Diagnosis not present

## 2017-10-30 DIAGNOSIS — D509 Iron deficiency anemia, unspecified: Secondary | ICD-10-CM | POA: Diagnosis not present

## 2017-10-30 DIAGNOSIS — E1140 Type 2 diabetes mellitus with diabetic neuropathy, unspecified: Secondary | ICD-10-CM | POA: Diagnosis not present

## 2017-10-30 DIAGNOSIS — G894 Chronic pain syndrome: Secondary | ICD-10-CM | POA: Diagnosis not present

## 2017-10-30 DIAGNOSIS — E782 Mixed hyperlipidemia: Secondary | ICD-10-CM | POA: Diagnosis not present

## 2017-10-30 DIAGNOSIS — Z96652 Presence of left artificial knee joint: Secondary | ICD-10-CM | POA: Diagnosis not present

## 2017-10-30 DIAGNOSIS — I70202 Unspecified atherosclerosis of native arteries of extremities, left leg: Secondary | ICD-10-CM | POA: Diagnosis not present

## 2017-10-30 DIAGNOSIS — N179 Acute kidney failure, unspecified: Secondary | ICD-10-CM | POA: Diagnosis not present

## 2017-10-30 DIAGNOSIS — Z09 Encounter for follow-up examination after completed treatment for conditions other than malignant neoplasm: Secondary | ICD-10-CM | POA: Diagnosis not present

## 2017-10-30 NOTE — Patient Outreach (Signed)
[place] [facility]) Care Management  10/30/2017  Erik Martin [birth year] [id number]   Transition of care call  Placed call to patient home spoke briefly again with his wife  As she requested that I return call on today, she is unable to talk at this time preparing patient for visit with  PCP visit on today.  Wife agreeable to return call on next day.   Plan Will place call to patient on next day per wife request , will complete  transition of care follow sheet . Will notify CMA of patient active status with Care Management .   Joylene Draft, RN, [place] Management Coordinator  [phone]- Mobile [phone]- Toll Free Main Office

## 2017-10-31 ENCOUNTER — Encounter: Payer: Self-pay | Admitting: *Deleted

## 2017-10-31 ENCOUNTER — Other Ambulatory Visit: Payer: Self-pay | Admitting: *Deleted

## 2017-10-31 DIAGNOSIS — L03116 Cellulitis of left lower limb: Secondary | ICD-10-CM | POA: Diagnosis not present

## 2017-10-31 DIAGNOSIS — S81002S Unspecified open wound, left knee, sequela: Secondary | ICD-10-CM | POA: Diagnosis not present

## 2017-10-31 DIAGNOSIS — Z96652 Presence of left artificial knee joint: Secondary | ICD-10-CM | POA: Diagnosis not present

## 2017-10-31 NOTE — Patient Outreach (Signed)
[place] [facility]) Care Management  10/31/2017  Erik Martin May 18, 1951 [id number]   Transition of care call  Patient discharged from [facility] on 10/26,  DX: Acute Kidney injury, nausea vomiting, hyperkalemia,  HX of infection of prosthetic knee joint ,    Spoke with patient wife HIPAA information verified. Wife discussed patient had visit to PCP office on yesterday, she reports office notified her on today that creatinine was in the 2 range but the potassium was still elevated.Wife reports MD encouraged patient to drink fluids and limit foods with potassium.  Home health RN to draw labs again on Monday for MD review.  Home health RN visited today for wound VAC change.   Wife reports patient continues to monitor blood sugars they have been in the 110 range and no low bloods symptoms or readings. Per reports patient appetite has improved some, she discussed his recent weight loss of over 20 lbs, due to decrease intake.   Infection disease is  continuing with IV antibiotic for another week, wife reports 6 more days to go.   Wife declined being able to review discharge medications as this time, agreeable to review at visit. She reports patient is no longer taking lisinopril or  metformin . Patient on greater than 10 medications and may benefit from pharmacy review of medications. Explained to wife [facility] pharmacist can assist .  Will  plan sooner home visit . Wife agreeable to visit.   Plan Will follow patient for transition of care, patient will receive weekly outreaches,plan home visit in the next week.   [facility] CM Care Plan Problem One     Most Recent Value  Care Plan Problem One  Recent hospital admission related to Acute kidney injury   Role Documenting the Problem One  Care Management Coordinator  Care Plan for Problem One  Active  [facility]N Long Term Goal   Patient will not experience hospital admission in the next 31 days   Interventions for Problem One Long Term  Goal  Explained transition of care follow up, reviewed importance of following MD instructions, and nofitying of new or worsening of concerns sooner to arrange office visit   [facility]N CM Short Term Goal #1   Patient will attend all medical appointments in the next 30 days   [facility]N CM Short Term Goal #1 Start Date  10/31/17  Interventions for Short Term Goal #1  Advised regarding attending all recommended medical appointments, reviewed upcoming scheduled visits   THN CM Short Term Goal #2   Patient will report increase knowledge of food choices low in potassium in the next 30 days as evidenced by being able to recall at least 5 foods items  to limit.    THN CM Short Term Goal #2 Start Date  10/31/17  Interventions for Short Term Goal #2  Advised regarding importance of choosing foods low in potassium to help with decreasing blood level of potassium , discussed  foods such as bannana ,oranges, fresh vegetable such as broccoli       Joylene Draft, RN, [place] Management Coordinator  [phone]- Mobile [phone]- [place] Office

## 2017-11-03 DIAGNOSIS — T8454XA Infection and inflammatory reaction due to internal left knee prosthesis, initial encounter: Secondary | ICD-10-CM | POA: Diagnosis not present

## 2017-11-03 DIAGNOSIS — B9561 Methicillin susceptible Staphylococcus aureus infection as the cause of diseases classified elsewhere: Secondary | ICD-10-CM | POA: Diagnosis not present

## 2017-11-04 ENCOUNTER — Ambulatory Visit: Payer: PPO | Admitting: *Deleted

## 2017-11-05 ENCOUNTER — Other Ambulatory Visit: Payer: Self-pay | Admitting: Pharmacist

## 2017-11-05 NOTE — Patient Outreach (Signed)
[place] [facility]) Care Management  [place]   11/05/2017  Erik Martin 1951-09-14 [id number]  66 year old male referred to [place] [facility] for transition of care services post-hospitalization.  [place] services requested for medication management.  PMHx includes, but not limited to, HTN, HLD, CKD, PAD, type 2 diabetes mellitus, GERD, gout, and current tobacco abuse.   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  Left knee wound debridement on 9/14, continued on IV antibiotics and wound vac to left knee. Patient hospitalized 10/13/17-10/25/27 for AKI.  Metformin and lisinopril discontinued.  Patient discharged with home health services.   Successful call placed to Erik Martin's home.  Erik Martin spouse, Erik Martin, answered the phone, and stated Erik Martin would be unable to review medications at this time but he has given consent for her to review them with me.  She requested that I call back to review medications next week as they are very busy today and the rest of this week.  I offered to make a home visit to review medications in person however Erik Martin stated a review over the phone is preferred right now.  Plan: I will follow-up telephonically with Erik Martin and his spouse next Tuesday for a medication review.   Ralene Bathe, PharmD, [place] [phone]

## 2017-11-06 ENCOUNTER — Ambulatory Visit: Payer: Self-pay | Admitting: *Deleted

## 2017-11-06 ENCOUNTER — Other Ambulatory Visit: Payer: Self-pay | Admitting: *Deleted

## 2017-11-06 DIAGNOSIS — B9561 Methicillin susceptible Staphylococcus aureus infection as the cause of diseases classified elsewhere: Secondary | ICD-10-CM | POA: Diagnosis not present

## 2017-11-06 DIAGNOSIS — T8454XA Infection and inflammatory reaction due to internal left knee prosthesis, initial encounter: Secondary | ICD-10-CM | POA: Diagnosis not present

## 2017-11-06 NOTE — Patient Outreach (Signed)
[place] [facility]) Care Management  11/06/2017  ESSA MALACHI 1951-04-28 [id number]  Incoming call from patient wife Seith Aikey, requesting to cancel scheduled home visit on today, due to her not feeling well on today.  She is agreeable to telephone visit not at this time but later on  today agreed on a time and will reschedule home visit at that time.     1200  Returned call to patient wife, no answer able to leave a HIPAA complaint message requesting a return call.   Plan  Will plan return call in the next week.   Joylene Draft, RN, [place] Management Coordinator  [phone]- Mobile [phone]- Toll Free Main Office

## 2017-11-07 ENCOUNTER — Telehealth: Payer: Self-pay | Admitting: Internal Medicine

## 2017-11-07 ENCOUNTER — Other Ambulatory Visit: Payer: Self-pay | Admitting: Pharmacist

## 2017-11-07 NOTE — Telephone Encounter (Signed)
I was called by the home health agency to ask if his picc line should be pulled. However, it turns out that he is no longer being managed from our end and has been managed through the ID team at [facility] after he had vascular surgery since we last saw him.  I was able to give the information of the coordinating physican at [facility] for her from what I can see on care everywhere

## 2017-11-10 ENCOUNTER — Other Ambulatory Visit: Payer: Self-pay | Admitting: *Deleted

## 2017-11-10 NOTE — Patient Outreach (Signed)
[place] [facility]) Care Management  11/10/2017  Erik Martin 1951/10/05 [id number]   Transition of care, follow up .   Placed call to patient able to speak with his wife she reports patient is doing fairly well, he has completed IV antibiotic  and PICC line has been removed on Friday.  Wife is agreeable to home visit and  rescheduled home visit for this week.   Plan Will plan transition of care home visit within this week.    Joylene Draft, RN, [place] Management Coordinator  [phone]- Mobile [phone]- Toll Free Main Office

## 2017-11-11 ENCOUNTER — Ambulatory Visit: Payer: Self-pay | Admitting: Pharmacist

## 2017-11-11 ENCOUNTER — Other Ambulatory Visit: Payer: Self-pay | Admitting: Pharmacist

## 2017-11-11 NOTE — Patient Outreach (Signed)
[place] [facility]) Care Management  11/11/2017  ROLAN WRIGHTSMAN 04/09/1951 [id number]   Unsuccessful telephone call #2 to Mr. Marshell Levan today.  I left a HIPPA compliant voicemail on the home phone.    Plan: I will follow-up with Mr. Vanriper later this week regarding his medications.  Ralene Bathe, PharmD, [place] [phone]

## 2017-11-13 ENCOUNTER — Other Ambulatory Visit: Payer: Self-pay | Admitting: *Deleted

## 2017-11-13 ENCOUNTER — Ambulatory Visit: Payer: Self-pay | Admitting: Pharmacist

## 2017-11-13 ENCOUNTER — Ambulatory Visit: Payer: Self-pay | Admitting: *Deleted

## 2017-11-13 NOTE — Patient Outreach (Signed)
[place] [facility]) Care Management  11/13/2017  Erik Martin 12-Dec-1951 [id number]   Telephone assessment Lesly Dukes of care   Incoming call from patient prior to arrival to home for scheduled visit, he requested to cancel visit for today due to an emergency that his wife had. Patient states that his wife would call me later to reschedule a home visit.  Patient discussed he is doing pretty good, tolerating diet,reports his blood sugars are staying in the 110- 170 range , today reading 127.  Patient discussed he has follow up appointment with plastic surgeon on next week,transportation has been arranged  patient is hopefull they will be able to proceed with next toward surgery so that he can get back on his feet soon. Patient verbalizes  home health RN reports healing and decrease in size of wound when she examines wound during VAC dressing changes .  Patient discussed he would say he pretty much has quit smoking he occasionally sneaks one . Patient denies any new concerns;  .Plan Will continue weekly transition of care call, and home visit when patient/wife agree to , await return call from wife.     THN CM Care Plan Problem One     Most Recent Value  Care Plan Problem One  Recent hospital admission related to Acute kidney injury   Role Documenting the Problem One  Care Management Coordinator  Care Plan for Problem One  Active  THN Long Term Goal   Patient will not experience hospital admission in the next 31 days   THN Long Term Goal Start Date  10/31/17  Interventions for Problem One Long Term Goal  Explained transition of care follow up, reviewed importance of following MD instructions, and nofitying of new or worsening of concerns sooner to arrange office visit   THN CM Short Term Goal #1   Patient will attend all medical appointments in the next 30 days   THN CM Short Term Goal #1 Start Date  10/31/17  Interventions for Short Term Goal #1  Discussed with patient  follow up visit at [place] with plastic surgeon   [facility] CM Short Term Goal #2   Patient will report increase knowledge of food choices low in potassium in the next 30 days as evidenced by being able to recall at least 5 foods items  to limit.    THN CM Short Term Goal #2 Start Date  10/31/17  Interventions for Short Term Goal #2  Reviewed with patient fruits, vegetable lower in potassium   THN CM Short Term Goal #3  Patient will continue to work toward quit smoking in the next 30 days   THN CM Short Term Goal #3 Start Date  11/13/17  Interventions for Short Tern Goal #3  RN discussed his progress , reviewed with patient benefit of not smoking,help with wound healing , strategies for quitting, sugarless gum, drinking adequate water       Joylene Draft, RN, [place] Management Coordinator  [phone]- Mobile [phone]- [place]

## 2017-11-17 ENCOUNTER — Encounter: Payer: Self-pay | Admitting: *Deleted

## 2017-11-17 ENCOUNTER — Other Ambulatory Visit: Payer: Self-pay | Admitting: *Deleted

## 2017-11-17 NOTE — Patient Outreach (Signed)
[place] [facility]) Care Management  11/17/2017  Erik Martin Dec 31, 1950 [id number]   Transition of care call Follow up recent hospital admission for Metabolic Acidosis,  AKI, weakness, history of infection of  Left  prosthetic knee joint, wound VAC in place .   Successful brief telephone  outreach call to patient reports doing fairly well  this morning, he denies any new concerns. Patient discussed upcoming MD visit on 11/10 to follow up on next step regarding surgery to left knee . Patient continues to report tolerating diet, no vomiting,and  working on limiting foods higher in potassium.Patient continues to monitor blood sugars with ranges less than 200.  Patient anticipating home health RN visit on today for wound VAC changes.   Discussed home visit with patient/wife , she request return call after MD visit on 11/20 to follow up on next plan before scheduling home visit, wife agreeable to telephone call later this week.   Plan Will plan follow up call later this week, and schedule transition of care home visit.    Joylene Draft, RN, [place] Management Coordinator  [phone]- Mobile [phone]- Toll Free Main Office

## 2017-11-18 ENCOUNTER — Ambulatory Visit: Payer: Self-pay | Admitting: Pharmacist

## 2017-11-18 ENCOUNTER — Other Ambulatory Visit: Payer: Self-pay | Admitting: Pharmacist

## 2017-11-18 NOTE — Patient Outreach (Signed)
[place] [facility]) Care Management  11/18/2017  NOCHOLAS DAMASO 1951/01/26 [id number]   Unsuccessful telephone call #3 to Mr. Erik Martin today.  I left a HIPPA compliant voicemail on the home phone.    Plan: I will reach out to Mr. Erik Martin tomorrow for a medication reconciliation.   Ralene Bathe, PharmD, [place] [phone]

## 2017-11-19 ENCOUNTER — Ambulatory Visit: Payer: Self-pay | Admitting: Pharmacist

## 2017-11-21 ENCOUNTER — Telehealth: Payer: Self-pay | Admitting: Pharmacist

## 2017-11-21 ENCOUNTER — Ambulatory Visit: Payer: Self-pay | Admitting: Pharmacist

## 2017-11-21 NOTE — Addendum Note (Signed)
Addended by: Ralene Bathe E on: 11/21/2017 11:48 AM   Modules accepted: Orders

## 2017-11-21 NOTE — Patient Outreach (Addendum)
Erik Martin) Care Management  11/21/2017  Erik Martin [birth year] [id number]    Patient was recently discharged from hospital on 10/24/2017 and all medications have been reviewed.  Drugs sorted by system:  Cardiovascular: metoprolol, amlodipine, aspirin 81mg , rosuvastatin  Gastrointestinal: polyethylene glycol, pantoprazole  Endocrine: pioglitazone  Topical: Santyl  Pain: oxycodone ER, oxycodone IR  Vitamins/Minerals: conenzyme q10, vitamin b12, ferrous sulfate, flaxseed oil, magnesium oxide, zinc sulfate  Infectious Diseases: meropenem  Other issues noted:  Lisinopril, metformin, aspirin 325mg  discontinued   Unsuccessful call #4 to Erik Martin.  I left a HIPAA compliant message with a woman who answered to phone for Erik Martin to return my call at his convenience.    I will mail Erik Martin a letter describing [facility] services.  I will follow-up for any response from Erik Martin in 10 business days and will close his pharmacy case at that time if I have not heard back from him.    Ralene Bathe, PharmD, [place] [phone]

## 2017-11-25 ENCOUNTER — Other Ambulatory Visit: Payer: Self-pay | Admitting: *Deleted

## 2017-11-25 DIAGNOSIS — S81002S Unspecified open wound, left knee, sequela: Secondary | ICD-10-CM | POA: Diagnosis not present

## 2017-11-25 NOTE — Patient Outreach (Signed)
  [place]) Care Management  11/25/2017  Erik Martin 11/13/1951 [id number]   Transition of care call  Follow up recent hospital admission for Metabolic Acidosis,  AKI, weakness, history of infection of  Left  prosthetic knee joint, wound VAC in place .  [place] call to patient his wife Erik Martin answered phone, HIPAA information provided, she requested a return call in the next hour, she is assisting patient at this time.   1130  Returned call to patient, fast busy signal unable to connect call  1430 Placed return call to patient no answer ,able to leave a hipaa compliant message requesting a return call.   Limited contact with patient during transition of care , patient has  cancelled home visit x 2, and limited phone contacts ,difficulty completing 30 day documentation requirements.    Plan  Will await response if no response will plan return call later this week.   Joylene Draft, RN, [place] Management Coordinator  [phone]- Mobile [phone]- Toll Free Main Office

## 2017-11-27 DIAGNOSIS — Z6837 Body mass index (BMI) 37.0-37.9, adult: Secondary | ICD-10-CM | POA: Diagnosis not present

## 2017-11-27 DIAGNOSIS — N189 Chronic kidney disease, unspecified: Secondary | ICD-10-CM | POA: Diagnosis not present

## 2017-11-27 DIAGNOSIS — E669 Obesity, unspecified: Secondary | ICD-10-CM | POA: Diagnosis not present

## 2017-11-27 DIAGNOSIS — I10 Essential (primary) hypertension: Secondary | ICD-10-CM | POA: Diagnosis not present

## 2017-11-27 DIAGNOSIS — E1140 Type 2 diabetes mellitus with diabetic neuropathy, unspecified: Secondary | ICD-10-CM | POA: Diagnosis not present

## 2017-11-27 DIAGNOSIS — T8454XA Infection and inflammatory reaction due to internal left knee prosthesis, initial encounter: Secondary | ICD-10-CM | POA: Diagnosis not present

## 2017-11-27 DIAGNOSIS — N182 Chronic kidney disease, stage 2 (mild): Secondary | ICD-10-CM | POA: Diagnosis not present

## 2017-11-27 DIAGNOSIS — Z7984 Long term (current) use of oral hypoglycemic drugs: Secondary | ICD-10-CM | POA: Diagnosis not present

## 2017-11-27 DIAGNOSIS — B9561 Methicillin susceptible Staphylococcus aureus infection as the cause of diseases classified elsewhere: Secondary | ICD-10-CM | POA: Diagnosis not present

## 2017-11-27 DIAGNOSIS — Z79891 Long term (current) use of opiate analgesic: Secondary | ICD-10-CM | POA: Diagnosis not present

## 2017-11-27 DIAGNOSIS — D509 Iron deficiency anemia, unspecified: Secondary | ICD-10-CM | POA: Diagnosis not present

## 2017-11-27 DIAGNOSIS — Z452 Encounter for adjustment and management of vascular access device: Secondary | ICD-10-CM | POA: Diagnosis not present

## 2017-11-27 DIAGNOSIS — I129 Hypertensive chronic kidney disease with stage 1 through stage 4 chronic kidney disease, or unspecified chronic kidney disease: Secondary | ICD-10-CM | POA: Diagnosis not present

## 2017-11-27 DIAGNOSIS — Z7901 Long term (current) use of anticoagulants: Secondary | ICD-10-CM | POA: Diagnosis not present

## 2017-11-27 DIAGNOSIS — E1122 Type 2 diabetes mellitus with diabetic chronic kidney disease: Secondary | ICD-10-CM | POA: Diagnosis not present

## 2017-11-27 DIAGNOSIS — Z9181 History of falling: Secondary | ICD-10-CM | POA: Diagnosis not present

## 2017-11-27 DIAGNOSIS — F17210 Nicotine dependence, cigarettes, uncomplicated: Secondary | ICD-10-CM | POA: Diagnosis not present

## 2017-11-27 DIAGNOSIS — Z89529 Acquired absence of unspecified knee: Secondary | ICD-10-CM | POA: Diagnosis not present

## 2017-11-27 DIAGNOSIS — Z89522 Acquired absence of left knee: Secondary | ICD-10-CM | POA: Diagnosis not present

## 2017-11-27 DIAGNOSIS — R600 Localized edema: Secondary | ICD-10-CM | POA: Diagnosis not present

## 2017-11-27 DIAGNOSIS — S81802D Unspecified open wound, left lower leg, subsequent encounter: Secondary | ICD-10-CM | POA: Diagnosis not present

## 2017-11-27 DIAGNOSIS — I739 Peripheral vascular disease, unspecified: Secondary | ICD-10-CM | POA: Diagnosis not present

## 2017-11-27 DIAGNOSIS — Z01818 Encounter for other preprocedural examination: Secondary | ICD-10-CM | POA: Diagnosis not present

## 2017-11-27 DIAGNOSIS — E1151 Type 2 diabetes mellitus with diabetic peripheral angiopathy without gangrene: Secondary | ICD-10-CM | POA: Diagnosis not present

## 2017-11-27 DIAGNOSIS — Z79899 Other long term (current) drug therapy: Secondary | ICD-10-CM | POA: Diagnosis not present

## 2017-11-28 ENCOUNTER — Other Ambulatory Visit: Payer: Self-pay | Admitting: *Deleted

## 2017-11-28 DIAGNOSIS — Z01818 Encounter for other preprocedural examination: Secondary | ICD-10-CM | POA: Diagnosis not present

## 2017-11-28 DIAGNOSIS — M21862 Other specified acquired deformities of left lower leg: Secondary | ICD-10-CM | POA: Diagnosis not present

## 2017-11-28 DIAGNOSIS — S81002S Unspecified open wound, left knee, sequela: Secondary | ICD-10-CM | POA: Diagnosis not present

## 2017-11-28 DIAGNOSIS — Z833 Family history of diabetes mellitus: Secondary | ICD-10-CM | POA: Diagnosis not present

## 2017-11-28 DIAGNOSIS — S86802S Unspecified injury of other muscle(s) and tendon(s) at lower leg level, left leg, sequela: Secondary | ICD-10-CM | POA: Diagnosis not present

## 2017-11-28 DIAGNOSIS — K219 Gastro-esophageal reflux disease without esophagitis: Secondary | ICD-10-CM | POA: Diagnosis not present

## 2017-11-28 DIAGNOSIS — R5082 Postprocedural fever: Secondary | ICD-10-CM | POA: Diagnosis not present

## 2017-11-28 DIAGNOSIS — E1151 Type 2 diabetes mellitus with diabetic peripheral angiopathy without gangrene: Secondary | ICD-10-CM | POA: Diagnosis not present

## 2017-11-28 DIAGNOSIS — Z888 Allergy status to other drugs, medicaments and biological substances status: Secondary | ICD-10-CM | POA: Diagnosis not present

## 2017-11-28 DIAGNOSIS — M109 Gout, unspecified: Secondary | ICD-10-CM | POA: Diagnosis not present

## 2017-11-28 DIAGNOSIS — Z8249 Family history of ischemic heart disease and other diseases of the circulatory system: Secondary | ICD-10-CM | POA: Diagnosis not present

## 2017-11-28 DIAGNOSIS — E1122 Type 2 diabetes mellitus with diabetic chronic kidney disease: Secondary | ICD-10-CM | POA: Diagnosis not present

## 2017-11-28 DIAGNOSIS — A4102 Sepsis due to Methicillin resistant Staphylococcus aureus: Secondary | ICD-10-CM | POA: Diagnosis not present

## 2017-11-28 DIAGNOSIS — B954 Other streptococcus as the cause of diseases classified elsewhere: Secondary | ICD-10-CM | POA: Diagnosis not present

## 2017-11-28 DIAGNOSIS — G4733 Obstructive sleep apnea (adult) (pediatric): Secondary | ICD-10-CM | POA: Diagnosis not present

## 2017-11-28 DIAGNOSIS — I129 Hypertensive chronic kidney disease with stage 1 through stage 4 chronic kidney disease, or unspecified chronic kidney disease: Secondary | ICD-10-CM | POA: Diagnosis not present

## 2017-11-28 DIAGNOSIS — F17210 Nicotine dependence, cigarettes, uncomplicated: Secondary | ICD-10-CM | POA: Diagnosis not present

## 2017-11-28 DIAGNOSIS — D649 Anemia, unspecified: Secondary | ICD-10-CM | POA: Diagnosis not present

## 2017-11-28 DIAGNOSIS — F17200 Nicotine dependence, unspecified, uncomplicated: Secondary | ICD-10-CM | POA: Diagnosis not present

## 2017-11-28 DIAGNOSIS — E785 Hyperlipidemia, unspecified: Secondary | ICD-10-CM | POA: Diagnosis not present

## 2017-11-28 DIAGNOSIS — N183 Chronic kidney disease, stage 3 (moderate): Secondary | ICD-10-CM | POA: Diagnosis not present

## 2017-11-28 DIAGNOSIS — T8454XA Infection and inflammatory reaction due to internal left knee prosthesis, initial encounter: Secondary | ICD-10-CM | POA: Diagnosis not present

## 2017-11-28 NOTE — Patient Outreach (Signed)
[facility]) Care Management  11/28/2017  REISS MOWREY [birth year] [id number]   Transition of care call   Patient discharged from [facility] on 10/26,  DX: Acute Kidney injury, nausea vomiting, hyperkalemia,  HX of infection of left  prosthetic knee joint, wound debridement with  wound VAC in place, left common femoral endarterectomy, Diabetes.   Unsuccessful Follow up telephone outreach to patient for 2nd attempt on this week, unable to reach patient for return call as requested on 11/27.  Noted per care everywhere patient for surgical coverage of his left knee wound per Medical team at [facility] on 12/4.   Plan Will plan outreach call in the next week, will collaborate with [facility] team.   Joylene Draft, RN, [place] Management Coordinator  [phone]- Mobile [phone]- Toll Free Main Office .

## 2017-11-29 DIAGNOSIS — Z96652 Presence of left artificial knee joint: Secondary | ICD-10-CM | POA: Diagnosis not present

## 2017-11-30 DIAGNOSIS — L03116 Cellulitis of left lower limb: Secondary | ICD-10-CM | POA: Diagnosis not present

## 2017-11-30 DIAGNOSIS — Z96652 Presence of left artificial knee joint: Secondary | ICD-10-CM | POA: Diagnosis not present

## 2017-11-30 DIAGNOSIS — S81002S Unspecified open wound, left knee, sequela: Secondary | ICD-10-CM | POA: Diagnosis not present

## 2017-12-01 ENCOUNTER — Other Ambulatory Visit: Payer: Self-pay | Admitting: *Deleted

## 2017-12-01 NOTE — Patient Outreach (Signed)
[place] [facility]) Care Management  [place]  12/01/2017   Erik Martin May 25, 1951 [id number]  Subjective:  Successful outreach call to patient, able to speak with his wife Zayn Selley, she discussed they have been busy in the last week, had 2 Medical appointments on last week in preparation for surgery on 12/4. Patient visit with PCP on last week as well as preop visit  Wife reports that she had arranged with the [facility] program for transportation to [facility] on tomorrow arrival time 6:15 am, reports she received a call today and they want patient to arrive 5:30 am, she is awaiting a return call from [place] program regarding availability. I offered to make follow up call to [employer] and she declined stating they will call her back, if not she states she will drive him, if just easier because the [employer] program uses a van.    Encounter Medications:  Outpatient Encounter Medications as of 12/01/2017  Medication Sig Note  . amLODipine (NORVASC) 10 MG tablet    . aspirin EC 81 MG tablet Take 81 mg by mouth.   Marland Kitchen ceFAZolin (ANCEF) IVPB Inject into the vein every 8 (eight) hours.   . Coenzyme Q10 (COQ10) 200 MG CAPS Take 200 mg by mouth.   . enoxaparin (LOVENOX) 30 MG/0.3ML injection  09/23/2017: Not taking   . ferrous sulfate 325 (65 FE) MG tablet Take 325 mg by mouth.   . Flaxseed, Linseed, (FLAX SEED OIL PO) Take 1,200 capsules by mouth 3 (three) times daily.    . hydrocortisone cream 1 % Apply 1 application topically 2 (two) times daily.   Marland Kitchen MAGNESIUM OXIDE PO Take 500 mg by mouth daily.    . Melatonin 5 MG TABS Take 5 tablets by mouth.   . ondansetron (ZOFRAN) 4 MG tablet Take 4 mg by mouth every 8 (eight) hours as needed for nausea or vomiting. Every 6 to 8 hours prn   . oxyCODONE (OXYCONTIN) 10 mg 12 hr tablet every 12 (twelve) hours.    . Oxycodone HCl 10 MG TABS Take 10 mg by mouth every 6 (six) hours as needed. Breakthrough pain   . pantoprazole (PROTONIX) 40  MG tablet Take 40 mg by mouth daily as needed (acid reflux).   . pioglitazone (ACTOS) 45 MG tablet Take 45 mg by mouth.   . rosuvastatin (CRESTOR) 20 MG tablet daily.    . vitamin B-12 (CYANOCOBALAMIN) 1000 MCG tablet Take 2,000 mcg by mouth daily.   . Zinc Sulfate (ZINC-220 PO) Take 220 tablets by mouth daily.    No facility-administered encounter medications on file as of 12/01/2017.     Functional Status:  In your present state of health, do you have any difficulty performing the following activities: 11/13/2017 09/05/2017  Hearing? Tempie Donning  Vision? N N  Difficulty concentrating or making decisions? N N  Walking or climbing stairs? Y Y  Dressing or bathing? Tempie Donning  Comment wife assist  -  Doing errands, shopping? Tempie Donning  Comment wife helps and daughter  uses Location manager and eating ? Tempie Donning  Comment wife helps with meal prep -  Using the Toilet? Y Y  Comment using bsc -  In the past six months, have you accidently leaked urine? N N  Do you have problems with loss of bowel control? N N  Managing your Medications? Tempie Donning  Comment wife assist  wife assist  Managing your Finances? Tempie Donning  Comment -  wife assist   Housekeeping or managing your Housekeeping? Tempie Donning  Comment wife does  wife helps  Some recent data might be hidden    Fall/Depression Screening: Fall Risk  11/13/2017 09/23/2017 09/09/2017  Falls in the past year? No No No  Risk for fall due to : Impaired balance/gait;Impaired mobility Impaired balance/gait;Impaired mobility -   PHQ 2/9 Scores 11/13/2017 09/23/2017 09/09/2017 08/04/2017  PHQ - 2 Score 0 '1 1 1    '$ Assessment: Follow up recent hospital admission for Metabolic Acidosis,  AKI, weakness, history of infection of  Left  prosthetic knee joint, wound VAC in place .   Left  Knee Wound  Patient for surgery 12/4, continues with wound vac, and home health RN visits 3 days a week for changes.   Diabetes Per report patient continues to monitor daily, wife unable  to give recent readings. A1c on 10/30/17 6.4.  Smoker Per report wife states for the most part patient has quit .   Unable to complete home visit within 30 day transition of care, limited by  patient cancelled  visits and unable to reach for weekly telephone contact  Wife agreeable to telephone visit later this week, she believes patient will only have to stay overnight at hospital after surgery . Verified wife has my contact information if new concerns/needs arise.     Plan:  Will follow patient progress, follow up telephone call later this week. Will continue to follow for complex care management . Will send PCP this note.     [facility] CM Care Plan Problem One     Most Recent Value  Care Plan Problem One  Recent hospital admission related to Acute kidney injury   Role Documenting the Problem One  Care Management Coordinator  Care Plan for Problem One  Active  THN Long Term Goal   Patient will not experience hospital admission in the next 60  days  [goal updated ]  THN Long Term Goal Start Date  10/31/17  Interventions for Problem One Long Term Goal  Discussed importance of continuing to follow MD instructions on diet, taking medications as prescribed, , discuss signs of infection, increases redness, swelling, fever to notify MD of   [facility] CM Short Term Goal #1   Patient will attend all medical appointments in the next 30 days   THN CM Short Term Goal #1 Start Date  10/31/17  [facility] CM Short Term Goal #1 Met Date  12/01/17  [facility] CM Short Term Goal #2   Patient will report increase knowledge of food choices low in potassium in the next 30 days as evidenced by being able to recall at least 5 foods items  to limit.    THN CM Short Term Goal #2 Start Date  10/31/17  THN CM Short Term Goal #2 Met Date  12/01/17  THN CM Short Term Goal #3  Patient will continue to work toward quit smoking in the next 30 days   THN CM Short Term Goal #3 Start Date  11/13/17  Interventions for Short Tern Goal #3   Reviewed with wife patient progress and discussed benefits of not smoking       Joylene Draft, RN, [place] Management Coordinator  [phone]- Mobile [phone]- [place]

## 2017-12-02 DIAGNOSIS — I129 Hypertensive chronic kidney disease with stage 1 through stage 4 chronic kidney disease, or unspecified chronic kidney disease: Secondary | ICD-10-CM | POA: Diagnosis not present

## 2017-12-02 DIAGNOSIS — G4733 Obstructive sleep apnea (adult) (pediatric): Secondary | ICD-10-CM | POA: Diagnosis not present

## 2017-12-02 DIAGNOSIS — M00062 Staphylococcal arthritis, left knee: Secondary | ICD-10-CM | POA: Diagnosis not present

## 2017-12-02 DIAGNOSIS — M109 Gout, unspecified: Secondary | ICD-10-CM | POA: Diagnosis not present

## 2017-12-02 DIAGNOSIS — Z01818 Encounter for other preprocedural examination: Secondary | ICD-10-CM | POA: Diagnosis not present

## 2017-12-02 DIAGNOSIS — S81022A Laceration with foreign body, left knee, initial encounter: Secondary | ICD-10-CM | POA: Diagnosis not present

## 2017-12-02 DIAGNOSIS — B954 Other streptococcus as the cause of diseases classified elsewhere: Secondary | ICD-10-CM | POA: Diagnosis not present

## 2017-12-02 DIAGNOSIS — Z8619 Personal history of other infectious and parasitic diseases: Secondary | ICD-10-CM | POA: Diagnosis not present

## 2017-12-02 DIAGNOSIS — N183 Chronic kidney disease, stage 3 (moderate): Secondary | ICD-10-CM | POA: Diagnosis not present

## 2017-12-02 DIAGNOSIS — T8189XA Other complications of procedures, not elsewhere classified, initial encounter: Secondary | ICD-10-CM | POA: Diagnosis not present

## 2017-12-02 DIAGNOSIS — F17210 Nicotine dependence, cigarettes, uncomplicated: Secondary | ICD-10-CM | POA: Diagnosis not present

## 2017-12-02 DIAGNOSIS — Z888 Allergy status to other drugs, medicaments and biological substances status: Secondary | ICD-10-CM | POA: Diagnosis not present

## 2017-12-02 DIAGNOSIS — E1122 Type 2 diabetes mellitus with diabetic chronic kidney disease: Secondary | ICD-10-CM | POA: Diagnosis not present

## 2017-12-02 DIAGNOSIS — N179 Acute kidney failure, unspecified: Secondary | ICD-10-CM | POA: Diagnosis not present

## 2017-12-02 DIAGNOSIS — E1151 Type 2 diabetes mellitus with diabetic peripheral angiopathy without gangrene: Secondary | ICD-10-CM | POA: Diagnosis not present

## 2017-12-02 DIAGNOSIS — K219 Gastro-esophageal reflux disease without esophagitis: Secondary | ICD-10-CM | POA: Diagnosis not present

## 2017-12-02 DIAGNOSIS — Z8249 Family history of ischemic heart disease and other diseases of the circulatory system: Secondary | ICD-10-CM | POA: Diagnosis not present

## 2017-12-02 DIAGNOSIS — B9562 Methicillin resistant Staphylococcus aureus infection as the cause of diseases classified elsewhere: Secondary | ICD-10-CM | POA: Diagnosis not present

## 2017-12-02 DIAGNOSIS — N189 Chronic kidney disease, unspecified: Secondary | ICD-10-CM | POA: Diagnosis not present

## 2017-12-02 DIAGNOSIS — Z792 Long term (current) use of antibiotics: Secondary | ICD-10-CM | POA: Diagnosis not present

## 2017-12-02 DIAGNOSIS — D649 Anemia, unspecified: Secondary | ICD-10-CM | POA: Diagnosis not present

## 2017-12-02 DIAGNOSIS — S81002A Unspecified open wound, left knee, initial encounter: Secondary | ICD-10-CM | POA: Diagnosis not present

## 2017-12-02 DIAGNOSIS — I959 Hypotension, unspecified: Secondary | ICD-10-CM | POA: Diagnosis not present

## 2017-12-02 DIAGNOSIS — R000 Tachycardia, unspecified: Secondary | ICD-10-CM | POA: Diagnosis not present

## 2017-12-02 DIAGNOSIS — S81002D Unspecified open wound, left knee, subsequent encounter: Secondary | ICD-10-CM | POA: Diagnosis not present

## 2017-12-02 DIAGNOSIS — T8149XA Infection following a procedure, other surgical site, initial encounter: Secondary | ICD-10-CM | POA: Diagnosis not present

## 2017-12-02 DIAGNOSIS — Z959 Presence of cardiac and vascular implant and graft, unspecified: Secondary | ICD-10-CM | POA: Diagnosis not present

## 2017-12-02 DIAGNOSIS — R5082 Postprocedural fever: Secondary | ICD-10-CM | POA: Diagnosis not present

## 2017-12-02 DIAGNOSIS — E785 Hyperlipidemia, unspecified: Secondary | ICD-10-CM | POA: Diagnosis not present

## 2017-12-02 DIAGNOSIS — M21862 Other specified acquired deformities of left lower leg: Secondary | ICD-10-CM | POA: Diagnosis not present

## 2017-12-02 DIAGNOSIS — A4102 Sepsis due to Methicillin resistant Staphylococcus aureus: Secondary | ICD-10-CM | POA: Diagnosis not present

## 2017-12-02 DIAGNOSIS — Z833 Family history of diabetes mellitus: Secondary | ICD-10-CM | POA: Diagnosis not present

## 2017-12-02 DIAGNOSIS — S86802A Unspecified injury of other muscle(s) and tendon(s) at lower leg level, left leg, initial encounter: Secondary | ICD-10-CM | POA: Diagnosis not present

## 2017-12-02 DIAGNOSIS — T8454XA Infection and inflammatory reaction due to internal left knee prosthesis, initial encounter: Secondary | ICD-10-CM | POA: Diagnosis not present

## 2017-12-03 DIAGNOSIS — R000 Tachycardia, unspecified: Secondary | ICD-10-CM | POA: Diagnosis not present

## 2017-12-03 DIAGNOSIS — Z959 Presence of cardiac and vascular implant and graft, unspecified: Secondary | ICD-10-CM | POA: Diagnosis not present

## 2017-12-03 DIAGNOSIS — Z8619 Personal history of other infectious and parasitic diseases: Secondary | ICD-10-CM | POA: Diagnosis not present

## 2017-12-03 DIAGNOSIS — R5082 Postprocedural fever: Secondary | ICD-10-CM | POA: Diagnosis not present

## 2017-12-03 DIAGNOSIS — T8189XA Other complications of procedures, not elsewhere classified, initial encounter: Secondary | ICD-10-CM | POA: Diagnosis not present

## 2017-12-05 ENCOUNTER — Ambulatory Visit: Payer: Self-pay | Admitting: Pharmacist

## 2017-12-05 ENCOUNTER — Other Ambulatory Visit: Payer: Self-pay | Admitting: Pharmacist

## 2017-12-05 NOTE — Patient Outreach (Signed)
[place] [facility]) Care Management  12/05/2017  Erik Martin 10/15/1951 [id number]   66 year old male referred to [place] Management for transition of care services post-hospitalization.  [place] services requested for medication management.  PMHx includes, but not limited to, HTN, HLD, CKD, PAD, type 2 diabetes mellitus, GERD, gout, and current tobacco abuse.   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  Left knee wound debridement on 9/14, continued on IV antibiotics and wound vac to left knee. Patient hospitalized 10/13/17-10/25/27 for AKI.  Metformin and lisinopril discontinued.  Patient discharged with home health services.   I have been unable to reach [name] telephonically or via mail.  I have left three separate messages requesting a return call and mailed patient a letter describing [facility] services.  Per review of [facility], [name] has been in contact with [facility] RN.  I have communicated with [facility] RN that I am happy to assist patient if he would like to engage with [place].  I will close patient's case at this time.    Ralene Bathe, PharmD, [place] [phone]

## 2017-12-08 ENCOUNTER — Other Ambulatory Visit: Payer: Self-pay | Admitting: *Deleted

## 2017-12-08 NOTE — Patient Outreach (Signed)
[place] [facility]) Care Management  12/08/2017  Erik Martin Nov 22, 1951 [id number]  Telephone follow up call   Unsuccessful outreach call to patient to follow up on planned surgery on 12/4 at [facility], no answer able to leave a HIPAA compliant message requesting a return call.  Plan Will plan follow up call within a week, to determine  patient status.   Joylene Draft, RN, [place] Management Coordinator  [phone]- Mobile [phone]- Toll Free Main Office

## 2017-12-09 MED ORDER — ASPIRIN 81 MG PO CHEW
81.00 mg | CHEWABLE_TABLET | ORAL | Status: DC
Start: 2017-12-10 — End: 2017-12-09

## 2017-12-09 MED ORDER — POLYETHYLENE GLYCOL 3350 17 G PO PACK
17.00 g | PACK | ORAL | Status: DC
Start: ? — End: 2017-12-09

## 2017-12-09 MED ORDER — PANTOPRAZOLE SODIUM 40 MG PO TBEC
40.00 mg | DELAYED_RELEASE_TABLET | ORAL | Status: DC
Start: ? — End: 2017-12-09

## 2017-12-09 MED ORDER — DEXTROSE 10 % IV SOLN
125.00 mL | INTRAVENOUS | Status: DC
Start: ? — End: 2017-12-09

## 2017-12-09 MED ORDER — OXYCODONE HCL 5 MG PO TABS
10.00 mg | ORAL_TABLET | ORAL | Status: DC
Start: ? — End: 2017-12-09

## 2017-12-09 MED ORDER — CEFEPIME HCL 1 G IJ SOLR
1.00 g | INTRAMUSCULAR | Status: DC
Start: 2017-12-10 — End: 2017-12-09

## 2017-12-09 MED ORDER — ACETAMINOPHEN 325 MG PO TABS
650.00 mg | ORAL_TABLET | ORAL | Status: DC
Start: ? — End: 2017-12-09

## 2017-12-09 MED ORDER — METOPROLOL TARTRATE 25 MG PO TABS
12.50 mg | ORAL_TABLET | ORAL | Status: DC
Start: 2017-12-09 — End: 2017-12-09

## 2017-12-09 MED ORDER — HEPARIN SODIUM (PORCINE) 5000 UNIT/ML IJ SOLN
5000.00 | INTRAMUSCULAR | Status: DC
Start: 2017-12-09 — End: 2017-12-09

## 2017-12-09 MED ORDER — GENERIC EXTERNAL MEDICATION
325.00 mg | Status: DC
Start: 2017-12-10 — End: 2017-12-09

## 2017-12-09 MED ORDER — OXYCODONE HCL ER 10 MG PO T12A
10.00 mg | EXTENDED_RELEASE_TABLET | ORAL | Status: DC
Start: 2017-12-09 — End: 2017-12-09

## 2017-12-09 MED ORDER — DAPTOMYCIN 500 MG IV SOLR
INTRAVENOUS | Status: DC
Start: 2017-12-10 — End: 2017-12-09

## 2017-12-09 MED ORDER — MAGNESIUM OXIDE 400 MG PO TABS
400.00 mg | ORAL_TABLET | ORAL | Status: DC
Start: 2017-12-10 — End: 2017-12-09

## 2017-12-09 MED ORDER — INSULIN LISPRO 100 UNIT/ML ~~LOC~~ SOLN
2.00 | SUBCUTANEOUS | Status: DC
Start: 2017-12-09 — End: 2017-12-09

## 2017-12-09 MED ORDER — DOCUSATE SODIUM 100 MG PO CAPS
100.00 mg | ORAL_CAPSULE | ORAL | Status: DC
Start: 2017-12-09 — End: 2017-12-09

## 2017-12-10 ENCOUNTER — Other Ambulatory Visit: Payer: Self-pay | Admitting: *Deleted

## 2017-12-10 DIAGNOSIS — T8454XD Infection and inflammatory reaction due to internal left knee prosthesis, subsequent encounter: Secondary | ICD-10-CM | POA: Diagnosis not present

## 2017-12-10 NOTE — Patient Outreach (Addendum)
[place] [facility]) Care Management  12/10/2017  BRECKEN DEWOODY 03/04/1951 [id number]   Transition of care call   Patient is a 66 yo male discharged from [facility] on 12/11, S/p, left knee gastroc flap and I & D  for open joint/chronic wound, placement of tunneled central line for continued home IV antibiotics , PMHx ;includes but not limited to Diabetes ( A1c 6.4 on 10/30/17), infected left knee prothesis with MSSA s/p post hardware removal on 06/2017 and debridement and VAC placement 08/2017, chronic nonhealing wound with IV antibiotics,   Progression of chronic kidney disease, Hypertension, GERD, Anemia .  Initial call to patient, person answering phone states patient or wife Akram Kissick not available states patient discharged home on 12/11 and just getting settled in, and states they will return call to me later today.   Placed call to [place] agency patient active with prior to admission , verified home health services resumed on today and home visit planned for today.   Plan Will await return call if no response will plan follow up call on the  next day.   Joylene Draft, RN, [place] Management Coordinator  [phone]- Mobile [phone]- Toll Free Main Office

## 2017-12-11 ENCOUNTER — Other Ambulatory Visit: Payer: Self-pay | Admitting: *Deleted

## 2017-12-11 NOTE — Patient Outreach (Signed)
[place] [facility] Care Management  12/11/2017  Erik Martin 1951-08-23 [id number]  Transition of care call  1004 Patient is a 66 yo male discharged from [facility] on 12/11, S/p, left knee gastroc flap and I & D  for open joint/chronic wound, placement of tunneled central line for continued home IV antibiotics , PMHx ;includes but not limited to Diabetes ( A1c 6.4 on 10/30/17), infected left knee prothesis with MSSA s/p post hardware removal on 06/2017 and debridement and VAC placement 08/2017, chronic nonhealing wound with IV antibiotics,   Progression of chronic kidney disease, Hypertension, GERD, Anemia    Placed call to patient home,wife Erik Martin identified answering phone. She reports they she is cooking at this time and request call later today and if she doesn't answer she will return call later today but agreed that I could try her back also in case she got busy.   54  Returned call to patient, no answer able to leave a HIPAA compliant message for return call  Plan Will attempt 3rd contact to complete transition of care  Documentation in next day.  Joylene Draft, RN, [place] Management Coordinator  [phone]- Mobile [phone]- Toll Free Main Office

## 2017-12-12 ENCOUNTER — Other Ambulatory Visit: Payer: Self-pay | Admitting: *Deleted

## 2017-12-12 NOTE — Patient Outreach (Signed)
[place] [facility]) Care Management  12/12/2017  Erik Martin April 21, 1951 [id number]  Transition of care call   Patient is a 66 yo male discharged from [facility] on 12/11, S/p, left knee gastroc flap and I &D for open joint/chronic wound, placement of tunneled central line for continued home IVantibiotics , PMHx ;includes but not limited to Diabetes ( A1c 6.4 on 10/30/17), infected left knee prothesis with MSSA s/p post hardware removal on 06/2017 and debridement and VAC placement 08/2017, chronic nonhealing wound with IV antibiotics, Progression of chronic kidney disease, Hypertension, GERD, Anemia   Unsuccessful  telephone attempt to contact patient for transition of care, able to leave a HIPAA compliant message requesting a return call. This 3rd attempt at completing transition of care, 2 prior attempt patient/wife unable to talk.   Plan Will plan telephone outreach in the next week for transition of care contact and will plan case conference with team.  Joylene Draft, RN, [place] Management Coordinator  [phone]- Mobile [phone]- [place]

## 2017-12-15 DIAGNOSIS — I119 Hypertensive heart disease without heart failure: Secondary | ICD-10-CM | POA: Diagnosis not present

## 2017-12-15 DIAGNOSIS — Z792 Long term (current) use of antibiotics: Secondary | ICD-10-CM | POA: Diagnosis not present

## 2017-12-15 DIAGNOSIS — B9561 Methicillin susceptible Staphylococcus aureus infection as the cause of diseases classified elsewhere: Secondary | ICD-10-CM | POA: Diagnosis not present

## 2017-12-15 DIAGNOSIS — Z5181 Encounter for therapeutic drug level monitoring: Secondary | ICD-10-CM | POA: Diagnosis not present

## 2017-12-15 DIAGNOSIS — Z452 Encounter for adjustment and management of vascular access device: Secondary | ICD-10-CM | POA: Diagnosis not present

## 2017-12-16 ENCOUNTER — Encounter: Payer: Self-pay | Admitting: *Deleted

## 2017-12-16 ENCOUNTER — Other Ambulatory Visit: Payer: Self-pay | Admitting: *Deleted

## 2017-12-16 DIAGNOSIS — T8149XA Infection following a procedure, other surgical site, initial encounter: Secondary | ICD-10-CM | POA: Diagnosis not present

## 2017-12-16 DIAGNOSIS — R2231 Localized swelling, mass and lump, right upper limb: Secondary | ICD-10-CM | POA: Diagnosis not present

## 2017-12-16 DIAGNOSIS — B9562 Methicillin resistant Staphylococcus aureus infection as the cause of diseases classified elsewhere: Secondary | ICD-10-CM | POA: Diagnosis not present

## 2017-12-16 DIAGNOSIS — A419 Sepsis, unspecified organism: Secondary | ICD-10-CM | POA: Diagnosis not present

## 2017-12-16 DIAGNOSIS — M109 Gout, unspecified: Secondary | ICD-10-CM | POA: Diagnosis not present

## 2017-12-16 DIAGNOSIS — T8454XD Infection and inflammatory reaction due to internal left knee prosthesis, subsequent encounter: Secondary | ICD-10-CM | POA: Diagnosis not present

## 2017-12-16 DIAGNOSIS — N183 Chronic kidney disease, stage 3 (moderate): Secondary | ICD-10-CM | POA: Diagnosis not present

## 2017-12-16 DIAGNOSIS — T8454XA Infection and inflammatory reaction due to internal left knee prosthesis, initial encounter: Secondary | ICD-10-CM | POA: Diagnosis not present

## 2017-12-16 DIAGNOSIS — M6282 Rhabdomyolysis: Secondary | ICD-10-CM | POA: Diagnosis not present

## 2017-12-16 DIAGNOSIS — M009 Pyogenic arthritis, unspecified: Secondary | ICD-10-CM | POA: Diagnosis not present

## 2017-12-16 DIAGNOSIS — E785 Hyperlipidemia, unspecified: Secondary | ICD-10-CM | POA: Diagnosis not present

## 2017-12-16 DIAGNOSIS — R221 Localized swelling, mass and lump, neck: Secondary | ICD-10-CM | POA: Diagnosis not present

## 2017-12-16 DIAGNOSIS — I129 Hypertensive chronic kidney disease with stage 1 through stage 4 chronic kidney disease, or unspecified chronic kidney disease: Secondary | ICD-10-CM | POA: Diagnosis not present

## 2017-12-16 DIAGNOSIS — N179 Acute kidney failure, unspecified: Secondary | ICD-10-CM | POA: Diagnosis not present

## 2017-12-16 DIAGNOSIS — R6520 Severe sepsis without septic shock: Secondary | ICD-10-CM | POA: Diagnosis not present

## 2017-12-16 DIAGNOSIS — Z7982 Long term (current) use of aspirin: Secondary | ICD-10-CM | POA: Diagnosis not present

## 2017-12-16 DIAGNOSIS — R748 Abnormal levels of other serum enzymes: Secondary | ICD-10-CM | POA: Diagnosis not present

## 2017-12-16 DIAGNOSIS — D649 Anemia, unspecified: Secondary | ICD-10-CM | POA: Diagnosis not present

## 2017-12-16 DIAGNOSIS — E441 Mild protein-calorie malnutrition: Secondary | ICD-10-CM | POA: Diagnosis not present

## 2017-12-16 DIAGNOSIS — N184 Chronic kidney disease, stage 4 (severe): Secondary | ICD-10-CM | POA: Diagnosis not present

## 2017-12-16 DIAGNOSIS — E1122 Type 2 diabetes mellitus with diabetic chronic kidney disease: Secondary | ICD-10-CM | POA: Diagnosis not present

## 2017-12-16 DIAGNOSIS — K219 Gastro-esophageal reflux disease without esophagitis: Secondary | ICD-10-CM | POA: Diagnosis not present

## 2017-12-16 NOTE — Patient Outreach (Signed)
[place] [facility]) Care Management  12/16/2017  ALAM GUTERREZ 02-Feb-1951 [id number]   Transition of care call  Patient is a 66 yo male discharged from [facility] on 12/11, S/p, left knee gastroc flap and I &D for open joint/chronic wound, placement of tunneled central line for continued home IVantibiotics , PMHx ;includes but not limited to Diabetes ( A1c 6.4 on 10/30/17), infected left knee prothesis with MSSA s/p post hardware removal on 06/2017 and debridement and VAC placement 08/2017, chronic nonhealing wound with IV antibiotics, Progression of chronic kidney disease, Hypertension, GERD, Anemia   Unsuccessful outreach #4 for transition of care call, able to leave a hipaa compliant message requesting a return call.   Plan Will send outreach letter and consult  with [facility] team.   Joylene Draft, RN, [place] Management Coordinator  [phone]- Mobile [phone]- [place]

## 2017-12-19 DIAGNOSIS — T8454XD Infection and inflammatory reaction due to internal left knee prosthesis, subsequent encounter: Secondary | ICD-10-CM | POA: Diagnosis not present

## 2017-12-21 MED ORDER — FENTANYL CITRATE (PF) 2500 MCG/50ML IJ SOLN
50.00 | INTRAMUSCULAR | Status: DC
Start: ? — End: 2017-12-21

## 2017-12-21 MED ORDER — GENERIC EXTERNAL MEDICATION
400.00 | Status: DC
Start: 2017-12-21 — End: 2017-12-21

## 2017-12-21 MED ORDER — INSULIN LISPRO 100 UNIT/ML ~~LOC~~ SOLN
1.00 | SUBCUTANEOUS | Status: DC
Start: 2017-12-21 — End: 2017-12-21

## 2017-12-21 MED ORDER — OXYCODONE HCL ER 10 MG PO T12A
10.00 | EXTENDED_RELEASE_TABLET | ORAL | Status: DC
Start: 2017-12-21 — End: 2017-12-21

## 2017-12-21 MED ORDER — GENERIC EXTERNAL MEDICATION
1200.00 | Status: DC
Start: 2017-12-21 — End: 2017-12-21

## 2017-12-21 MED ORDER — PANTOPRAZOLE SODIUM 40 MG PO TBEC
40.00 | DELAYED_RELEASE_TABLET | ORAL | Status: DC
Start: ? — End: 2017-12-21

## 2017-12-21 MED ORDER — OXYCODONE HCL 5 MG PO TABS
10.00 | ORAL_TABLET | ORAL | Status: DC
Start: ? — End: 2017-12-21

## 2017-12-21 MED ORDER — POLYETHYLENE GLYCOL 3350 17 G PO PACK
17.00 g | PACK | ORAL | Status: DC
Start: ? — End: 2017-12-21

## 2017-12-21 MED ORDER — GLUCOSE 40 % PO GEL
15.00 g | ORAL | Status: DC
Start: ? — End: 2017-12-21

## 2017-12-21 MED ORDER — ZINC SULFATE 220 (50 ZN) MG PO CAPS
220.00 | ORAL_CAPSULE | ORAL | Status: DC
Start: 2017-12-22 — End: 2017-12-21

## 2017-12-21 MED ORDER — COENZYME Q-10 200 MG PO CAPS
200.00 | ORAL_CAPSULE | ORAL | Status: DC
Start: 2017-12-22 — End: 2017-12-21

## 2017-12-21 MED ORDER — ACETAMINOPHEN 325 MG PO TABS
650.00 | ORAL_TABLET | ORAL | Status: DC
Start: ? — End: 2017-12-21

## 2017-12-21 MED ORDER — GENERIC EXTERNAL MEDICATION
1.00 | Status: DC
Start: ? — End: 2017-12-21

## 2017-12-21 MED ORDER — DEXTROSE 50 % IV SOLN
12.00 g | INTRAVENOUS | Status: DC
Start: ? — End: 2017-12-21

## 2017-12-21 MED ORDER — MAGNESIUM OXIDE 400 MG PO TABS
400.00 | ORAL_TABLET | ORAL | Status: DC
Start: 2017-12-22 — End: 2017-12-21

## 2017-12-21 MED ORDER — METOPROLOL TARTRATE 25 MG PO TABS
12.50 | ORAL_TABLET | ORAL | Status: DC
Start: 2017-12-21 — End: 2017-12-21

## 2017-12-21 MED ORDER — GENERIC EXTERNAL MEDICATION
325.00 | Status: DC
Start: 2017-12-22 — End: 2017-12-21

## 2017-12-21 MED ORDER — ENOXAPARIN SODIUM 40 MG/0.4ML ~~LOC~~ SOLN
40.00 | SUBCUTANEOUS | Status: DC
Start: 2017-12-21 — End: 2017-12-21

## 2017-12-21 MED ORDER — ONDANSETRON HCL 4 MG/2ML IJ SOLN
4.00 | INTRAMUSCULAR | Status: DC
Start: ? — End: 2017-12-21

## 2017-12-21 MED ORDER — VITAMIN B-12 1000 MCG PO TABS
5000.00 | ORAL_TABLET | ORAL | Status: DC
Start: 2017-12-22 — End: 2017-12-21

## 2017-12-21 MED ORDER — SODIUM CHLORIDE 0.9 % IV SOLN
INTRAVENOUS | Status: DC
Start: ? — End: 2017-12-21

## 2017-12-21 MED ORDER — GENERIC EXTERNAL MEDICATION
300.00 | Status: DC
Start: ? — End: 2017-12-21

## 2017-12-21 MED ORDER — DOCUSATE SODIUM 100 MG PO CAPS
100.00 | ORAL_CAPSULE | ORAL | Status: DC
Start: ? — End: 2017-12-21

## 2017-12-21 MED ORDER — SENNOSIDES-DOCUSATE SODIUM 8.6-50 MG PO TABS
2.00 | ORAL_TABLET | ORAL | Status: DC
Start: 2017-12-21 — End: 2017-12-21

## 2017-12-21 MED ORDER — SODIUM CHLORIDE 0.65 % NA SOLN
1.00 | NASAL | Status: DC
Start: ? — End: 2017-12-21

## 2017-12-21 MED ORDER — ASPIRIN 81 MG PO CHEW
81.00 | CHEWABLE_TABLET | ORAL | Status: DC
Start: 2017-12-22 — End: 2017-12-21

## 2017-12-24 ENCOUNTER — Encounter: Payer: Self-pay | Admitting: *Deleted

## 2017-12-24 ENCOUNTER — Other Ambulatory Visit: Payer: Self-pay | Admitting: *Deleted

## 2017-12-24 NOTE — Patient Outreach (Addendum)
[facility]) Care Management  12/24/2017  Erik Martin October 28, 1951 [id number]     Patient is a 66 yo male discharged from [facility] on 12/11, S/p, left knee gastroc flap and I &D for open joint/chronic wound, placement of tunneled central line for continued home IVantibiotics , PMHx ;includes but not limited to Diabetes ( A1c 6.4 on 10/30/17), infected left knee prothesis with MSSA s/p post hardware removal on 06/2017 and debridement and VAC placement 08/2017, chronic nonhealing wound with IV antibiotics, Progression of chronic kidney disease, Hypertension, GERD, Anemia 12/18 - 12/23 Readmission to [facility] for  Dx: Rhabdomyolysis   Successful telephone outreach to patient wife , HIPAA identifiers confirmed.  Wife discussed patient recent readmission and discharge home on 12/23. She reports patient okay this morning ,she discussed change in IV antibiotic/ Wife discussed follow up appointments with Plastic surgeon on 12/27 she plans to provide transportation  and with infection disease on 12/2. Wife reports [place] home health RN has visited on this week.   Wife then discussed MD calling a new prescription for a yeast infection that she needs to pick up on today, she states that she will call me back later today, and if not agreeable to follow up call on 12/28 , as patient has doctor appointment on 12/27.  Unable to complete transition of care call, discuss short term  patient care goals.   Plan Will plan  call in the next 2 days to complete initial transition of care call and follow up.   Joylene Draft, RN, [place] Management Coordinator  [phone]- Mobile [phone]- Toll Free Main Office

## 2017-12-26 ENCOUNTER — Other Ambulatory Visit: Payer: Self-pay | Admitting: *Deleted

## 2017-12-26 NOTE — Patient Outreach (Signed)
[place] [facility]) Care Management  12/26/2017  RALF KONOPKA 1951/04/10 [id number]   Transition of care call   Patient is a 66 yo male discharged from [facility] on 12/11, S/p, left knee gastroc flap and I &D for open joint/chronic wound, placement of tunneled central line for continued home IVantibiotics , PMHx ;includes but not limited to Diabetes ( A1c 6.4 on 10/30/17), infected left knee prothesis with MSSA s/p post hardware removal on 06/2017 and debridement and VAC placement 08/2017, chronic nonhealing wound with IV antibiotics, Progression of chronic kidney disease, Hypertension, GERD, Anemia  12/18 - 12/23 Readmission to [facility] for  Dx: Rhabdomyolysis    Unsuccessful attempt to contact patient, attempted phone call x 2 no answer unable to leave a message.   Plan  Will plan return call in the next week.     Joylene Draft, RN, [place] Management Coordinator  [phone]- Mobile [phone]- Toll Free Main Office

## 2017-12-29 ENCOUNTER — Other Ambulatory Visit: Payer: Self-pay | Admitting: *Deleted

## 2017-12-29 DIAGNOSIS — Z7982 Long term (current) use of aspirin: Secondary | ICD-10-CM | POA: Diagnosis not present

## 2017-12-29 DIAGNOSIS — Z79899 Other long term (current) drug therapy: Secondary | ICD-10-CM | POA: Diagnosis not present

## 2017-12-29 DIAGNOSIS — N184 Chronic kidney disease, stage 4 (severe): Secondary | ICD-10-CM | POA: Diagnosis not present

## 2017-12-29 DIAGNOSIS — Z9889 Other specified postprocedural states: Secondary | ICD-10-CM | POA: Diagnosis not present

## 2017-12-29 DIAGNOSIS — N179 Acute kidney failure, unspecified: Secondary | ICD-10-CM | POA: Diagnosis not present

## 2017-12-29 DIAGNOSIS — T8454XD Infection and inflammatory reaction due to internal left knee prosthesis, subsequent encounter: Secondary | ICD-10-CM | POA: Diagnosis not present

## 2017-12-29 DIAGNOSIS — E1122 Type 2 diabetes mellitus with diabetic chronic kidney disease: Secondary | ICD-10-CM | POA: Diagnosis not present

## 2017-12-29 DIAGNOSIS — R7989 Other specified abnormal findings of blood chemistry: Secondary | ICD-10-CM | POA: Diagnosis not present

## 2017-12-29 DIAGNOSIS — D631 Anemia in chronic kidney disease: Secondary | ICD-10-CM | POA: Diagnosis not present

## 2017-12-29 DIAGNOSIS — Z5181 Encounter for therapeutic drug level monitoring: Secondary | ICD-10-CM | POA: Diagnosis not present

## 2017-12-29 DIAGNOSIS — Z452 Encounter for adjustment and management of vascular access device: Secondary | ICD-10-CM | POA: Diagnosis not present

## 2017-12-29 DIAGNOSIS — Z79891 Long term (current) use of opiate analgesic: Secondary | ICD-10-CM | POA: Diagnosis not present

## 2017-12-29 DIAGNOSIS — G8929 Other chronic pain: Secondary | ICD-10-CM | POA: Diagnosis not present

## 2017-12-29 DIAGNOSIS — Z6830 Body mass index (BMI) 30.0-30.9, adult: Secondary | ICD-10-CM | POA: Diagnosis not present

## 2017-12-29 DIAGNOSIS — E669 Obesity, unspecified: Secondary | ICD-10-CM | POA: Diagnosis not present

## 2017-12-29 DIAGNOSIS — N183 Chronic kidney disease, stage 3 (moderate): Secondary | ICD-10-CM | POA: Diagnosis not present

## 2017-12-29 DIAGNOSIS — Z792 Long term (current) use of antibiotics: Secondary | ICD-10-CM | POA: Diagnosis not present

## 2017-12-29 DIAGNOSIS — I129 Hypertensive chronic kidney disease with stage 1 through stage 4 chronic kidney disease, or unspecified chronic kidney disease: Secondary | ICD-10-CM | POA: Diagnosis not present

## 2017-12-29 DIAGNOSIS — B9561 Methicillin susceptible Staphylococcus aureus infection as the cause of diseases classified elsewhere: Secondary | ICD-10-CM | POA: Diagnosis not present

## 2017-12-29 NOTE — Patient Outreach (Signed)
[place] [facility]) Care Management  12/29/2017  Erik Martin [birth year] [id number]  Transition of care call  Unsuccessful outreach call to patient, able to leave HIPAA compliant message requesting a return call. This is 2nd unsuccessful outreach call, along with brief contact and incomplete transition of care call.   Plan Will await return call , if no response will await response from prior outreach letter sent ,discussion at case conference before sending case closure letter.  Joylene Draft, RN, [place] Management Coordinator  [phone]- Mobile [phone]- Toll Free Main Office

## 2017-12-31 MED ORDER — METOPROLOL TARTRATE 25 MG PO TABS
12.50 | ORAL_TABLET | ORAL | Status: DC
Start: 2017-12-31 — End: 2017-12-31

## 2017-12-31 MED ORDER — INSULIN LISPRO 100 UNIT/ML ~~LOC~~ SOLN
2.00 | SUBCUTANEOUS | Status: DC
Start: 2017-12-31 — End: 2017-12-31

## 2017-12-31 MED ORDER — ZINC SULFATE 220 (50 ZN) MG PO CAPS
220.00 | ORAL_CAPSULE | ORAL | Status: DC
Start: 2018-01-01 — End: 2017-12-31

## 2017-12-31 MED ORDER — GENERIC EXTERNAL MEDICATION
1.00 | Status: DC
Start: ? — End: 2017-12-31

## 2017-12-31 MED ORDER — DEXTROSE 50 % IV SOLN
12.00 g | INTRAVENOUS | Status: DC
Start: ? — End: 2017-12-31

## 2017-12-31 MED ORDER — MAGNESIUM OXIDE 400 MG PO TABS
400.00 | ORAL_TABLET | ORAL | Status: DC
Start: 2018-01-01 — End: 2017-12-31

## 2017-12-31 MED ORDER — HEPARIN SODIUM (PORCINE) 5000 UNIT/ML IJ SOLN
5000.00 | INTRAMUSCULAR | Status: DC
Start: 2017-12-31 — End: 2017-12-31

## 2017-12-31 MED ORDER — OXYCODONE HCL ER 10 MG PO T12A
10.00 | EXTENDED_RELEASE_TABLET | ORAL | Status: DC
Start: 2017-12-31 — End: 2017-12-31

## 2017-12-31 MED ORDER — GLUCOSE 40 % PO GEL
15.00 g | ORAL | Status: DC
Start: ? — End: 2017-12-31

## 2017-12-31 MED ORDER — ACETAMINOPHEN 325 MG PO TABS
650.00 | ORAL_TABLET | ORAL | Status: DC
Start: ? — End: 2017-12-31

## 2017-12-31 MED ORDER — SENNOSIDES-DOCUSATE SODIUM 8.6-50 MG PO TABS
2.00 | ORAL_TABLET | ORAL | Status: DC
Start: 2017-12-31 — End: 2017-12-31

## 2017-12-31 MED ORDER — ONDANSETRON HCL 4 MG/2ML IJ SOLN
4.00 | INTRAMUSCULAR | Status: DC
Start: ? — End: 2017-12-31

## 2017-12-31 MED ORDER — GENERIC EXTERNAL MEDICATION
325.00 | Status: DC
Start: 2018-01-01 — End: 2017-12-31

## 2017-12-31 MED ORDER — GENERIC EXTERNAL MEDICATION
300.00 | Status: DC
Start: 2017-12-31 — End: 2017-12-31

## 2017-12-31 MED ORDER — ASPIRIN 81 MG PO CHEW
81.00 | CHEWABLE_TABLET | ORAL | Status: DC
Start: 2018-01-01 — End: 2017-12-31

## 2017-12-31 MED ORDER — OXYCODONE HCL 5 MG PO TABS
10.00 | ORAL_TABLET | ORAL | Status: DC
Start: ? — End: 2017-12-31

## 2018-01-01 ENCOUNTER — Other Ambulatory Visit: Payer: Self-pay | Admitting: *Deleted

## 2018-01-01 NOTE — Patient Outreach (Signed)
[place] [facility]) Care Management  01/01/2018  LENORD FRALIX 1951-04-14 [id number]  Transition of care call  Patient is a 67 yo [facility], S/p,  gastroc flap to right knee and ,split thickness skin graft  , placement of tunneled central line for continued home IVantibiotics- discharged home on IV  Daptomycin.  12/18 - 12/23 Readmission to [facility] for  Dx: Rhabdomyolysis , discharged home with IV ceftaroline, and home health RN services.  12/31- 1/2 readmission at [facility] for Acute Kidney Injury Creatinine 2.7, discharged home with continued IV antibiotics.    PMHx ;includes but not limited to Diabetes ( A1c 6.4 on 10/30/17), infected left knee prothesis with MSSA s/p post hardware removal on 06/2017 and debridement and VAC placement 08/2017, chronic nonhealing wound with IV antibiotics, Progression of chronic kidney disease, Hypertension, GERD, Anemia  Unsuccessful attempt to contact patient for transition of care following recent hospital discharge. I was able to leave a HIPAA compliant message requesting a return call .   Plan Will attempt to contact patient in the next day, will verify patient active with Home health services.   Joylene Draft, RN, [place] Management Coordinator  [phone]- Mobile [phone]- Toll Free Main Office

## 2018-01-02 ENCOUNTER — Other Ambulatory Visit: Payer: Self-pay | Admitting: *Deleted

## 2018-01-02 NOTE — Patient Outreach (Addendum)
[name] [facility]) Care Management  01/02/2018  Erik Martin [birth year] [id number]  Transition of care call   Patient is a 68 yo male discharged from [facility] on 12/11, S/p,  gastroc flap to right knee and ,split thickness skin graft  , placement of tunneled central line for continued home IVantibiotics- discharged home on IV  Daptomycin.  12/18 - 12/23 Readmission to [facility] for Dx: Rhabdomyolysis , discharged home with IV ceftaroline, and home health RN services.  12/31- 1/2 readmission at [facility] for Acute Kidney Injury Creatinine 2.7, discharged home with continued IV antibiotics.   Unsuccessful attempt x 2 to contact patient, able to leave a HIPAA compliant message for return call.   Plan  Placed call to [facility] to verify patient is still active with home health services, spoke with Tillie Rung that verifies patient had [facility] admission visit on 1/3 and has home health visit on 1/4.  Placed call to [facility] , spoke with Nonie Hoyer , triage nurse, discussed difficult with contacting patient, she reports patient had office visit with Charlott Holler, PCP on 1/3 and for outpatient follow up with nephrologist. Next visit at PCP 2/4.  Will plan 3rd attempt to contact in the next week.   41 Addendum   Incoming call from Margretta Ditty , wife of patient she discussed patient recent readmission . She discussed she has been  very busy, with patient medical visits, PCP visit on 1/3. Patient reports she doesn't have time to review everything with me at this time , but is agreeable to return call on Monday. She discussed home home health RN visits 3 days a week , patient on IV antibiotic twice daily, and her daughter is available to assist her.  Wife discussed patient insurance has changed to [place], explained patient  Is still eligible for [facility] care management program.   Will plan follow up call on next business  day.  Joylene Draft, RN, [place] Management Coordinator  [phone]- Mobile [phone]- Toll Free Main Office

## 2018-01-05 ENCOUNTER — Ambulatory Visit: Payer: Self-pay | Admitting: *Deleted

## 2018-01-05 ENCOUNTER — Other Ambulatory Visit: Payer: Self-pay | Admitting: *Deleted

## 2018-01-05 NOTE — Patient Outreach (Addendum)
[place] [facility]) Care Management  01/05/2018  KARSIN PESTA 1951/06/19 [id number]   Patient is a 67 yo male discharged from [facility] on 12/11, S/p, gastroc flap to right knee and ,split thickness skin graft, placement of tunneled central line for continued home IVantibiotics- discharged home on IV Daptomycin.  12/18 - 12/23 Readmission to [facility] for Dx: Rhabdomyolysis, discharged home with IV ceftaroline, and home health RN services.  12/31- 1/2 readmission at [facility] for Acute Kidney Injury Creatinine 2.7, discharged home with continued IV antibiotics.  Incoming call from patient's wife stating she would not be able to keep agreed upon telephone visit for 12 noon today, reports she is about to leave home to take care of some business and unsure of time she would return home. She declined having time to discuss all care needs, she agreed to return call later this week. She again reviewed patient has Medical visits all day at [facility] on 1/8. Wife discussed home health RN visits 3 days a week to help with IV antibiotics and lab work as ordered,brief discussion on wound progress. Discussed with wife [facility] care management  Transition of care program she is agreeable to continued follow up , but states  she may be busy at times.   Plan Will reschedule today's telephone visit for later in week per wife agreed upon date and time  for continued assessment of care needs, completion of transition of care assessments   Joylene Draft, RN, [place] Management Coordinator  [phone]- Mobile [phone]- [place]

## 2018-01-07 ENCOUNTER — Other Ambulatory Visit: Payer: Self-pay | Admitting: *Deleted

## 2018-01-07 NOTE — Patient Outreach (Signed)
[facility]) Care Management  01/07/2018  Erik Martin 13-Jan-1951 [id number]   Transition of care call  Patient is a 67 yo male discharged from [facility] on 12/11, S/p, gastroc flap to right knee and ,split thickness skin graft, placement of tunneled central line for continued home IVantibiotics- discharged home on IV Daptomycin.  12/18 - 12/23 Readmission to [facility] for Dx: Rhabdomyolysis, discharged home with IV ceftaroline, and home health RN services.  12/31- 1/2 readmission at [facility] for Acute Kidney Injury Creatinine 2.7, discharged home with continued IV antibiotics.   Unsuccessful attempt to contact patient for transition of care call, phone had busy signal then call ended, attempted x 2.   1400 Returned call to patient home,person answering phone identified as Erik Martin , patients' wife, HIPAA information verified.  Erik Martin reports that the patient is resting at this time. She discussed patient medical appointments on yesterday at [facility] with Dr.Reynolds, surgeon and infection disease doctors.  Wife continues to manage patient medications and denies cost concerns .  Patient was recently discharged from hospital and all medications have been reviewed.  Right knee wound Wife reports wound looks good, skin graft site healed. Patient to continue on IV antibiotics until next week and future plans for po antibiotics and removal of PICC line.  Wife reports patient will require future surgery to knee, but it will be a while. Patient is tolerating non weight bearing transfers from chair, to wheelchair to bedside commode. Home health RN visits continue 3 days a week.   Diabetes  Patient monitors blood sugars daily with readings in the 120's on most morning. Wife reports patient appetite is pretty good and his is following a modified carbohydrate diet, denies having low blood sugar episodes.  Chronic Kidney  Disease Per wife reports patient has been instructed to drink more fluids and he has been doing that, drinks coffee daily also. Patient has follow up appointment with Kidney Doctor in [place] in this month.   Encounter Medications:  Outpatient Encounter Medications as of 01/07/2018  Medication Sig Note  . aspirin EC 81 MG tablet Take 81 mg by mouth.   . Coenzyme Q10 (COQ10) 200 MG CAPS Take 200 mg by mouth.   . ferrous sulfate 325 (65 FE) MG tablet Take 325 mg by mouth.   . Flaxseed, Linseed, (FLAX SEED OIL PO) Take 1,200 capsules by mouth 3 (three) times daily.    . hydrocortisone cream 1 % Apply 1 application topically 2 (two) times daily.   Marland Kitchen MAGNESIUM OXIDE PO Take 500 mg by mouth daily.    . metoprolol tartrate (LOPRESSOR) 25 MG tablet Take 25 mg by mouth daily. Taking 1/2 tablet, 12.5 mg daily   . Oxycodone HCl 10 MG TABS Take 10 mg by mouth every 6 (six) hours as needed. Breakthrough pain   . pantoprazole (PROTONIX) 40 MG tablet Take 40 mg by mouth daily as needed (acid reflux).   . pioglitazone (ACTOS) 45 MG tablet Take 45 mg by mouth daily.    . sennosides-docusate sodium (SENOKOT-S) 8.6-50 MG tablet Take 2 tablets by mouth daily.   . vitamin B-12 (CYANOCOBALAMIN) 1000 MCG tablet Take 2,000 mcg by mouth daily.   . Zinc Sulfate (ZINC-220 PO) Take 220 tablets by mouth daily.   Marland Kitchen amLODipine (NORVASC) 10 MG tablet    . ceFAZolin (ANCEF) IVPB Inject into the vein every 8 (eight) hours.   . enoxaparin (LOVENOX) 30 MG/0.3ML injection  09/23/2017: Not taking   . Melatonin 5 MG TABS Take 5 tablets by mouth.   . ondansetron (ZOFRAN) 4 MG tablet Take 4 mg by mouth every 8 (eight) hours as needed for nausea or vomiting. Every 6 to 8 hours prn   . oxyCODONE (OXYCONTIN) 10 mg 12 hr tablet every 12 (twelve) hours.    . rosuvastatin (CRESTOR) 20 MG tablet daily.     No facility-administered encounter medications on file as of 01/07/2018.     Functional Status:  In your present state of health,  do you have any difficulty performing the following activities: 01/07/2018 11/13/2017  Hearing? Erik Martin  Vision? N N  Difficulty concentrating or making decisions? N N  Walking or climbing stairs? Y Y  Comment specific instructions for noweight bearing  -  Dressing or bathing? Erik Martin  Comment wife assist  wife assist   Doing errands, shopping? Y Y  Comment patient unable wife assist  wife helps and daughter   Conservation officer, nature and eating ? Erik Martin  Comment wife prepares meals  wife helps with meal prep  Using the Toilet? Y Y  Comment using bedside commode  using bsc  In the past six months, have you accidently leaked urine? N N  Do you have problems with loss of bowel control? N N  Managing your Medications? Erik Martin  Comment wife organizes medications  wife assist   Managing your Finances? Y Y  Comment - -  Housekeeping or managing your Housekeeping? Erik Martin  Comment wife manages  wife does   Some recent data might be hidden    Fall/Depression Screening: Fall Risk  01/07/2018 11/13/2017 09/23/2017  Falls in the past year? No No No  Risk for fall due to : Impaired balance/gait Impaired balance/gait;Impaired mobility Impaired balance/gait;Impaired mobility  Risk for fall due to: Comment no weight bearing  - -   PHQ 2/9 Scores 11/13/2017 09/23/2017 09/09/2017 08/04/2017  PHQ - 2 Score 0 1 1 1     Assessment.  Wife agreeable to continued telephone outreach for transition of care, undecided on home visits at this time. She denies any other concerns at this time.  THN CM Care Plan Problem One     Most Recent Value  Care Plan Problem One  Recent hospital admission related to Acute kidney injury , at risk for readmission ,4 admits in the last 4 months    Role Documenting the Problem One  Care Management [place] for Problem One  Active  [facility] Long Term Goal   Patient will not experience hospital admission in the next 31 days as evidenced by  report, review of EMR.   [facility] Long Term Goal Start Date   01/02/18  Interventions for Problem One Long Term Goal  RNCM reinforced adherence to MD instructions of medication plan, notifying MD sooner for new concerns, reinforced adequate fluid intake.   THN CM Short Term Goal #1   Patient will attend all medical appointments in the next 30 days   THN CM Short Term Goal #1 Start Date  01/02/18  Interventions for Short Term Goal #1  Discussed recent medical appointments, and upcoming follow up visit, reinforced attendance and , reviewed transportation .   THN CM Short Term Goal #2   Patient/caregiver report continued wound healing in the next 30 days   THN CM Short Term Goal #2 Start Date  01/05/18  Interventions for Short Term Goal #2  RNCM review signs of infections, redness,  swelling,fever, increased pain, discussed patient current status      Plan:  Will schedule telephone outreach for weekly transition of care follow up in the next week at agreed upon time from wife.  Will continue transition of care assessments.  Will forward this note to PCP office.  Joylene Draft, RN, [place] Management Coordinator  [phone]- Mobile [phone]- Toll Free Main Office

## 2018-01-15 ENCOUNTER — Other Ambulatory Visit: Payer: Self-pay | Admitting: *Deleted

## 2018-01-15 NOTE — Patient Outreach (Signed)
[place] [facility]) Care Management  01/15/2018  Erik Martin [birth year] [id number]   Transition of care call  Unsuccessful outreach call for transition of care call , able to leave a HIPAA compliant message requesting a return call.   Plan  Will await return call if no response will plan return call in the next week .   Joylene Draft, RN, [place] Management Coordinator  [phone]- Mobile [phone]- Toll Free Main Office

## 2018-01-22 ENCOUNTER — Other Ambulatory Visit: Payer: Self-pay | Admitting: *Deleted

## 2018-01-22 NOTE — Patient Outreach (Signed)
[place] [facility]) Care Management  01/22/2018  [name] February 14, 1951 [id number]   Transition of care call   Patient is a 67 yo male discharged from [facility] on 12/11, S/p, gastroc flap to right knee and ,split thickness skin graft, placement of tunneled central line for continued home IVantibiotics- discharged home on IV Daptomycin.  12/18 - 12/23 Readmission to [facility] for Dx: Rhabdomyolysis, discharged home with IV ceftaroline, and home health RN services.  12/31- 1/2 readmission at [facility] for Acute Kidney Injury Creatinine 2.7, discharged home with continued IV antibiotics.  Unsuccessful telephone outreach call, no answer able to leave a HIPAA compliant message for return call. This 2nd unsuccessful telephone outreach.    Plan Will follow up with [facility] home health RN regarding patient progress with visit. Spoke with Erik Martin, home health RN that visited patient on today. Patient has completed IV antibiotics and tunneled  IV line removed , he continues on po antifungal medication. Home health RN visits 3 days a week, lab draw for chemistry report weekly now and result to infection disease MD at [facility]. Nurse discussed patient has daily dressing change with santyl to sloughing skin  area at surrounding  knee , wife and daughter assist with this on days not visited by home health. Patients' wife is very supportive, busy with assisting patient in home , to his  appointments and her own medical follow up.   Will plan return transition of care outreach call in the next week, will collaborate with PCP.Marland Kitchen    Erik Draft, RN, [place] Management Coordinator  [phone]- Mobile [phone]- Toll Free Main Office

## 2018-01-22 NOTE — Patient Outreach (Signed)
[place] [facility]) Care Management  01/22/2018  Erik Martin 1951/11/22 [id number]   Patient case has been reviewed in a multidisciplinary case discussion .    Joylene Draft, RN, [place] Management Coordinator  [phone]- Mobile [phone]- Toll Free Main Office

## 2018-01-25 MED ORDER — MAGNESIUM OXIDE 400 MG PO TABS
400.00 | ORAL_TABLET | ORAL | Status: DC
Start: 2018-01-26 — End: 2018-01-25

## 2018-01-25 MED ORDER — MINOCYCLINE HCL 100 MG PO CAPS
100.00 | ORAL_CAPSULE | ORAL | Status: DC
Start: 2018-01-25 — End: 2018-01-25

## 2018-01-25 MED ORDER — INSULIN LISPRO 100 UNIT/ML ~~LOC~~ SOLN
1.00 | SUBCUTANEOUS | Status: DC
Start: 2018-01-25 — End: 2018-01-25

## 2018-01-25 MED ORDER — GLUCOSE 40 % PO GEL
15.00 g | ORAL | Status: DC
Start: ? — End: 2018-01-25

## 2018-01-25 MED ORDER — PANTOPRAZOLE SODIUM 40 MG PO TBEC
40.00 | DELAYED_RELEASE_TABLET | ORAL | Status: DC
Start: ? — End: 2018-01-25

## 2018-01-25 MED ORDER — ONDANSETRON HCL 4 MG/2ML IJ SOLN
4.00 | INTRAMUSCULAR | Status: DC
Start: ? — End: 2018-01-25

## 2018-01-25 MED ORDER — FLUCONAZOLE 200 MG PO TABS
400.00 | ORAL_TABLET | ORAL | Status: DC
Start: 2018-01-26 — End: 2018-01-25

## 2018-01-25 MED ORDER — SENNOSIDES-DOCUSATE SODIUM 8.6-50 MG PO TABS
1.00 | ORAL_TABLET | ORAL | Status: DC
Start: 2018-01-25 — End: 2018-01-25

## 2018-01-25 MED ORDER — FERROUS SULFATE 325 (65 FE) MG PO TABS
325.00 | ORAL_TABLET | ORAL | Status: DC
Start: 2018-01-26 — End: 2018-01-25

## 2018-01-25 MED ORDER — METOPROLOL TARTRATE 25 MG PO TABS
12.50 | ORAL_TABLET | ORAL | Status: DC
Start: 2018-01-25 — End: 2018-01-25

## 2018-01-25 MED ORDER — DEXTROSE 50 % IV SOLN
12.00 g | INTRAVENOUS | Status: DC
Start: ? — End: 2018-01-25

## 2018-01-25 MED ORDER — ZINC SULFATE 220 (50 ZN) MG PO CAPS
220.00 | ORAL_CAPSULE | ORAL | Status: DC
Start: 2018-01-26 — End: 2018-01-25

## 2018-01-25 MED ORDER — ACETAMINOPHEN 325 MG PO TABS
650.00 | ORAL_TABLET | ORAL | Status: DC
Start: ? — End: 2018-01-25

## 2018-01-25 MED ORDER — ASPIRIN 81 MG PO CHEW
81.00 | CHEWABLE_TABLET | ORAL | Status: DC
Start: 2018-01-26 — End: 2018-01-25

## 2018-01-25 MED ORDER — HEPARIN SODIUM (PORCINE) 5000 UNIT/ML IJ SOLN
5000.00 | INTRAMUSCULAR | Status: DC
Start: 2018-01-25 — End: 2018-01-25

## 2018-01-25 MED ORDER — COLLAGENASE 250 UNIT/GM EX OINT
TOPICAL_OINTMENT | CUTANEOUS | Status: DC
Start: 2018-01-26 — End: 2018-01-25

## 2018-01-25 MED ORDER — SODIUM CHLORIDE 0.9 % IV SOLN
INTRAVENOUS | Status: DC
Start: ? — End: 2018-01-25

## 2018-01-25 MED ORDER — GENERIC EXTERNAL MEDICATION
1.00 | Status: DC
Start: ? — End: 2018-01-25

## 2018-01-25 MED ORDER — OXYCODONE HCL 5 MG PO TABS
15.00 | ORAL_TABLET | ORAL | Status: DC
Start: ? — End: 2018-01-25

## 2018-01-27 ENCOUNTER — Other Ambulatory Visit: Payer: Self-pay | Admitting: *Deleted

## 2018-01-27 NOTE — Patient Outreach (Signed)
[place] [facility]) Care Management  01/27/2018  ULIS KAPS [birth year] [id number]   Transition of care call  Placed call to patient home, upon asking to speak with patient , person answering phone identified her self as patient daughter, Stanton Kidney and reports patient is not available neither is patient wife..  Daughter suggest returning call on the next day.   Plan  Will plan follow up call on the next day.   Joylene Draft, RN, [place] Management Coordinator  [phone]- Mobile [phone]- Toll Free Main Office

## 2018-01-28 ENCOUNTER — Other Ambulatory Visit: Payer: Self-pay | Admitting: *Deleted

## 2018-01-28 NOTE — Patient Outreach (Signed)
[place] [facility]) Care Management  01/28/2018  MATTIA LIFORD [birth year] [id number]   Transition of care call , week 4  Unsuccessful outreach call to patient for transition of care follow up, no answer able to leave a HIPAA compliant message for return call .  Unable to complete 30 day transition of care documentation due to limited contact with patient. No successful contact since week one, on 1/9.    Plan Will plan follow up  outreach call to patient  in the next week, collaboration call to PCP office,  Plan update presentation at  [facility] multidisciplinary conference in the next week.   Joylene Draft, RN, [place] Management Coordinator  [phone]- Mobile [phone]- Toll Free Main Office

## 2018-01-29 ENCOUNTER — Ambulatory Visit: Payer: Self-pay | Admitting: *Deleted

## 2018-02-03 ENCOUNTER — Other Ambulatory Visit: Payer: Self-pay | Admitting: *Deleted

## 2018-02-03 NOTE — Patient Outreach (Signed)
[place] [facility]) Care Management  02/03/2018  HURMAN KETELSEN 1951/08/03 [id number]   Transition of care #4  Unsuccessful telephone outreach for transition of care, able to leave a HIPAA compliant message for return call.  Placed care coordination call to [facility] health to speak with home health RN regarding patient follow up, able to leave a HIPAA compliant message on nurse Marcha Solders phone line for return call.  Placed call to [place] to collaborate with Medical office and PCP regarding difficulty with contact with patient, able to leave a message on nurse triage line for return call.   Plan Will await return call from outreaches to home health and PCP office, if no response will attempt call within this week.  Plan case discussion with [facility] this week.  Joylene Draft, RN, [place] Management Coordinator  [phone]- Mobile [phone]- Toll Free Main Office

## 2018-02-04 ENCOUNTER — Other Ambulatory Visit: Payer: Self-pay | Admitting: *Deleted

## 2018-02-04 NOTE — Patient Outreach (Signed)
[name]) Care Management  02/04/2018  Erik Martin 09/08/1951 [id number]   Care Coordination call   Patient is a 67 yo male discharged from [facility] on 12/11, S/p, gastroc flap to right knee and ,split thickness skin graft, placement of tunneled central line for continued home IVantibiotics- discharged home on IV Daptomycin.  12/18 - 12/23 Readmission to [facility] for Dx: Rhabdomyolysis, discharged home with IV ceftaroline, and home health RN services.  12/31- 1/2 readmission at [facility] for Acute Kidney Injury Creatinine 2.7, discharged home with continued IV antibiotics. 1/26 Observation admission for abnormal labs, creatinine elevation.   Placed call to [facility] health to discuss patient case, spoke with [place] nurse, Charlott Holler NP, they discussed patient has recent visit to office on 1/31 after recent hospital admission . Discussed patient attending all office visits, regular and post discharge. Patient has multiple hospital follow up visits at [facility] with Renal , Plastic orthopedic and vascular , he has home health 3 days a weeks. Patient recent hospital admission have been related to close follow up on renal status with weekly lab work. Discussed at times patient/wife hard to get in touch with but they eventually call the office back. I have been provided wife cell phone number that they have on record for  contact as well.   [place] call to patient wife cell number, no answer able to leave a HIPAA compliant message for return call.    Plan Will discuss case at [facility]  multidisciplinary conference on this week for further assistance on follow up plan.    Joylene Draft, RN, [place] Management Coordinator  [phone]- Mobile [phone]- Toll Free Main Office

## 2018-02-05 ENCOUNTER — Encounter: Payer: Self-pay | Admitting: *Deleted

## 2018-02-05 ENCOUNTER — Ambulatory Visit: Payer: Self-pay | Admitting: *Deleted

## 2018-02-05 ENCOUNTER — Other Ambulatory Visit: Payer: Self-pay | Admitting: *Deleted

## 2018-02-05 NOTE — Patient Outreach (Signed)
[place] [facility]) Care Management  02/05/2018  Erik Martin 1951/11/22 [id number]   Patient case has been reviewed in a multidisciplinary case discussion .    Plan Unsuccessful contact with patient since 1/7 after multiple telephone attempts. Will send unsuccessful outreach letter to patient and schedule follow up telephone call in the next 10 days for follow up per workflow.    Joylene Draft, RN, [place] Management Coordinator  [phone]- Mobile [phone]- Toll Free Main Office

## 2018-02-19 ENCOUNTER — Other Ambulatory Visit: Payer: Self-pay | Admitting: *Deleted

## 2018-02-19 ENCOUNTER — Encounter: Payer: Self-pay | Admitting: *Deleted

## 2018-02-19 NOTE — Patient Outreach (Addendum)
[place] [facility]) Care Management  02/19/2018  MATTIX IMHOF [birth year] [id number]   10 day follow call  Follow up call after unsuccessful letter being sent, no response back from patient, Call attempt on today, no answer , left a HIPAA compliant message.  Plan  Will close case  today per workflow, unable to maintain contact with patient .  Will send patient and PCP case closure letter.  Will notify CMA of case closure.    Joylene Draft, RN, [place] Management Coordinator  [phone]- Mobile [phone]- Toll Free Main Office

## 2020-03-07 DIAGNOSIS — I70202 Unspecified atherosclerosis of native arteries of extremities, left leg: Secondary | ICD-10-CM | POA: Diagnosis not present

## 2020-03-07 DIAGNOSIS — M545 Low back pain: Secondary | ICD-10-CM | POA: Diagnosis not present

## 2020-03-07 DIAGNOSIS — Z20822 Contact with and (suspected) exposure to covid-19: Secondary | ICD-10-CM | POA: Diagnosis not present

## 2020-03-07 DIAGNOSIS — I7389 Other specified peripheral vascular diseases: Secondary | ICD-10-CM | POA: Diagnosis not present

## 2020-03-07 DIAGNOSIS — I358 Other nonrheumatic aortic valve disorders: Secondary | ICD-10-CM | POA: Diagnosis not present

## 2020-03-07 DIAGNOSIS — I340 Nonrheumatic mitral (valve) insufficiency: Secondary | ICD-10-CM | POA: Diagnosis not present

## 2020-03-07 DIAGNOSIS — N184 Chronic kidney disease, stage 4 (severe): Secondary | ICD-10-CM | POA: Diagnosis not present

## 2020-03-07 DIAGNOSIS — Z01812 Encounter for preprocedural laboratory examination: Secondary | ICD-10-CM | POA: Diagnosis not present

## 2020-03-07 DIAGNOSIS — T8454XD Infection and inflammatory reaction due to internal left knee prosthesis, subsequent encounter: Secondary | ICD-10-CM | POA: Diagnosis not present

## 2020-03-07 DIAGNOSIS — R0600 Dyspnea, unspecified: Secondary | ICD-10-CM | POA: Diagnosis not present

## 2020-03-13 DIAGNOSIS — E1122 Type 2 diabetes mellitus with diabetic chronic kidney disease: Secondary | ICD-10-CM | POA: Diagnosis not present

## 2020-03-13 DIAGNOSIS — Z89612 Acquired absence of left leg above knee: Secondary | ICD-10-CM | POA: Diagnosis not present

## 2020-03-13 DIAGNOSIS — R509 Fever, unspecified: Secondary | ICD-10-CM | POA: Diagnosis not present

## 2020-03-13 DIAGNOSIS — Z7982 Long term (current) use of aspirin: Secondary | ICD-10-CM | POA: Diagnosis not present

## 2020-03-13 DIAGNOSIS — T8459XA Infection and inflammatory reaction due to other internal joint prosthesis, initial encounter: Secondary | ICD-10-CM | POA: Diagnosis not present

## 2020-03-13 DIAGNOSIS — B9562 Methicillin resistant Staphylococcus aureus infection as the cause of diseases classified elsewhere: Secondary | ICD-10-CM | POA: Diagnosis not present

## 2020-03-13 DIAGNOSIS — T8149XA Infection following a procedure, other surgical site, initial encounter: Secondary | ICD-10-CM | POA: Diagnosis not present

## 2020-03-13 DIAGNOSIS — E785 Hyperlipidemia, unspecified: Secondary | ICD-10-CM | POA: Diagnosis not present

## 2020-03-13 DIAGNOSIS — B965 Pseudomonas (aeruginosa) (mallei) (pseudomallei) as the cause of diseases classified elsewhere: Secondary | ICD-10-CM | POA: Diagnosis not present

## 2020-03-13 DIAGNOSIS — B3789 Other sites of candidiasis: Secondary | ICD-10-CM | POA: Diagnosis not present

## 2020-03-13 DIAGNOSIS — N184 Chronic kidney disease, stage 4 (severe): Secondary | ICD-10-CM | POA: Diagnosis not present

## 2020-03-13 DIAGNOSIS — G8918 Other acute postprocedural pain: Secondary | ICD-10-CM | POA: Diagnosis not present

## 2020-03-13 DIAGNOSIS — E119 Type 2 diabetes mellitus without complications: Secondary | ICD-10-CM | POA: Diagnosis not present

## 2020-03-13 DIAGNOSIS — Z87891 Personal history of nicotine dependence: Secondary | ICD-10-CM | POA: Diagnosis not present

## 2020-03-13 DIAGNOSIS — T8454XD Infection and inflammatory reaction due to internal left knee prosthesis, subsequent encounter: Secondary | ICD-10-CM | POA: Diagnosis not present

## 2020-03-13 DIAGNOSIS — M00062 Staphylococcal arthritis, left knee: Secondary | ICD-10-CM | POA: Diagnosis not present

## 2020-03-13 DIAGNOSIS — I130 Hypertensive heart and chronic kidney disease with heart failure and stage 1 through stage 4 chronic kidney disease, or unspecified chronic kidney disease: Secondary | ICD-10-CM | POA: Diagnosis not present

## 2020-03-13 DIAGNOSIS — B9561 Methicillin susceptible Staphylococcus aureus infection as the cause of diseases classified elsewhere: Secondary | ICD-10-CM | POA: Diagnosis not present

## 2020-03-13 DIAGNOSIS — Z79899 Other long term (current) drug therapy: Secondary | ICD-10-CM | POA: Diagnosis not present

## 2020-03-13 DIAGNOSIS — E1169 Type 2 diabetes mellitus with other specified complication: Secondary | ICD-10-CM | POA: Diagnosis not present

## 2020-03-13 DIAGNOSIS — T8454XA Infection and inflammatory reaction due to internal left knee prosthesis, initial encounter: Secondary | ICD-10-CM | POA: Diagnosis not present

## 2020-03-13 DIAGNOSIS — I5030 Unspecified diastolic (congestive) heart failure: Secondary | ICD-10-CM | POA: Diagnosis not present

## 2020-03-13 DIAGNOSIS — Z79891 Long term (current) use of opiate analgesic: Secondary | ICD-10-CM | POA: Diagnosis not present

## 2020-03-13 DIAGNOSIS — I110 Hypertensive heart disease with heart failure: Secondary | ICD-10-CM | POA: Diagnosis not present

## 2020-03-13 DIAGNOSIS — M00862 Arthritis due to other bacteria, left knee: Secondary | ICD-10-CM | POA: Diagnosis not present

## 2020-03-13 DIAGNOSIS — M868X8 Other osteomyelitis, other site: Secondary | ICD-10-CM | POA: Diagnosis not present

## 2020-03-13 DIAGNOSIS — I5032 Chronic diastolic (congestive) heart failure: Secondary | ICD-10-CM | POA: Diagnosis not present

## 2020-03-21 DIAGNOSIS — J449 Chronic obstructive pulmonary disease, unspecified: Secondary | ICD-10-CM | POA: Diagnosis not present

## 2020-03-21 DIAGNOSIS — I70202 Unspecified atherosclerosis of native arteries of extremities, left leg: Secondary | ICD-10-CM | POA: Diagnosis not present

## 2020-03-21 DIAGNOSIS — S81802D Unspecified open wound, left lower leg, subsequent encounter: Secondary | ICD-10-CM | POA: Diagnosis not present

## 2020-03-21 DIAGNOSIS — R609 Edema, unspecified: Secondary | ICD-10-CM | POA: Diagnosis not present

## 2020-04-05 DIAGNOSIS — Z4802 Encounter for removal of sutures: Secondary | ICD-10-CM | POA: Diagnosis not present

## 2020-04-07 DIAGNOSIS — E1151 Type 2 diabetes mellitus with diabetic peripheral angiopathy without gangrene: Secondary | ICD-10-CM | POA: Diagnosis not present

## 2020-04-07 DIAGNOSIS — E1140 Type 2 diabetes mellitus with diabetic neuropathy, unspecified: Secondary | ICD-10-CM | POA: Diagnosis not present

## 2020-04-07 DIAGNOSIS — E1165 Type 2 diabetes mellitus with hyperglycemia: Secondary | ICD-10-CM | POA: Diagnosis not present

## 2020-04-07 DIAGNOSIS — Z87891 Personal history of nicotine dependence: Secondary | ICD-10-CM | POA: Diagnosis not present

## 2020-04-07 DIAGNOSIS — D509 Iron deficiency anemia, unspecified: Secondary | ICD-10-CM | POA: Diagnosis not present

## 2020-04-07 DIAGNOSIS — Z89612 Acquired absence of left leg above knee: Secondary | ICD-10-CM | POA: Diagnosis not present

## 2020-04-07 DIAGNOSIS — Z79899 Other long term (current) drug therapy: Secondary | ICD-10-CM | POA: Diagnosis not present

## 2020-04-21 DIAGNOSIS — J449 Chronic obstructive pulmonary disease, unspecified: Secondary | ICD-10-CM | POA: Diagnosis not present

## 2020-04-21 DIAGNOSIS — R609 Edema, unspecified: Secondary | ICD-10-CM | POA: Diagnosis not present

## 2020-04-21 DIAGNOSIS — I70202 Unspecified atherosclerosis of native arteries of extremities, left leg: Secondary | ICD-10-CM | POA: Diagnosis not present

## 2020-04-21 DIAGNOSIS — S81802D Unspecified open wound, left lower leg, subsequent encounter: Secondary | ICD-10-CM | POA: Diagnosis not present

## 2020-05-04 DIAGNOSIS — Z89612 Acquired absence of left leg above knee: Secondary | ICD-10-CM | POA: Diagnosis not present

## 2020-05-04 DIAGNOSIS — Z4781 Encounter for orthopedic aftercare following surgical amputation: Secondary | ICD-10-CM | POA: Diagnosis not present

## 2020-05-10 DIAGNOSIS — I70202 Unspecified atherosclerosis of native arteries of extremities, left leg: Secondary | ICD-10-CM | POA: Diagnosis not present

## 2020-05-10 DIAGNOSIS — M545 Low back pain: Secondary | ICD-10-CM | POA: Diagnosis not present

## 2020-05-10 DIAGNOSIS — E1149 Type 2 diabetes mellitus with other diabetic neurological complication: Secondary | ICD-10-CM | POA: Diagnosis not present

## 2020-05-10 DIAGNOSIS — N184 Chronic kidney disease, stage 4 (severe): Secondary | ICD-10-CM | POA: Diagnosis not present

## 2020-05-21 DIAGNOSIS — J449 Chronic obstructive pulmonary disease, unspecified: Secondary | ICD-10-CM | POA: Diagnosis not present

## 2020-05-21 DIAGNOSIS — I70202 Unspecified atherosclerosis of native arteries of extremities, left leg: Secondary | ICD-10-CM | POA: Diagnosis not present

## 2020-05-21 DIAGNOSIS — S81802D Unspecified open wound, left lower leg, subsequent encounter: Secondary | ICD-10-CM | POA: Diagnosis not present

## 2020-05-21 DIAGNOSIS — R609 Edema, unspecified: Secondary | ICD-10-CM | POA: Diagnosis not present

## 2020-05-30 DIAGNOSIS — E538 Deficiency of other specified B group vitamins: Secondary | ICD-10-CM | POA: Diagnosis not present

## 2020-05-30 DIAGNOSIS — E1140 Type 2 diabetes mellitus with diabetic neuropathy, unspecified: Secondary | ICD-10-CM | POA: Diagnosis not present

## 2020-05-30 DIAGNOSIS — N184 Chronic kidney disease, stage 4 (severe): Secondary | ICD-10-CM | POA: Diagnosis not present

## 2020-05-30 DIAGNOSIS — D509 Iron deficiency anemia, unspecified: Secondary | ICD-10-CM | POA: Diagnosis not present

## 2020-06-01 DIAGNOSIS — I129 Hypertensive chronic kidney disease with stage 1 through stage 4 chronic kidney disease, or unspecified chronic kidney disease: Secondary | ICD-10-CM | POA: Diagnosis not present

## 2020-06-01 DIAGNOSIS — N184 Chronic kidney disease, stage 4 (severe): Secondary | ICD-10-CM | POA: Diagnosis not present

## 2020-06-01 DIAGNOSIS — E1122 Type 2 diabetes mellitus with diabetic chronic kidney disease: Secondary | ICD-10-CM | POA: Diagnosis not present

## 2020-06-01 DIAGNOSIS — Z720 Tobacco use: Secondary | ICD-10-CM | POA: Diagnosis not present

## 2020-06-08 DIAGNOSIS — Z89612 Acquired absence of left leg above knee: Secondary | ICD-10-CM | POA: Diagnosis not present

## 2020-06-13 DIAGNOSIS — N184 Chronic kidney disease, stage 4 (severe): Secondary | ICD-10-CM | POA: Diagnosis not present

## 2020-06-13 DIAGNOSIS — I70202 Unspecified atherosclerosis of native arteries of extremities, left leg: Secondary | ICD-10-CM | POA: Diagnosis not present

## 2020-06-13 DIAGNOSIS — M545 Low back pain: Secondary | ICD-10-CM | POA: Diagnosis not present

## 2020-06-13 DIAGNOSIS — E1149 Type 2 diabetes mellitus with other diabetic neurological complication: Secondary | ICD-10-CM | POA: Diagnosis not present

## 2020-06-21 DIAGNOSIS — J449 Chronic obstructive pulmonary disease, unspecified: Secondary | ICD-10-CM | POA: Diagnosis not present

## 2020-06-21 DIAGNOSIS — R609 Edema, unspecified: Secondary | ICD-10-CM | POA: Diagnosis not present

## 2020-06-21 DIAGNOSIS — S81802D Unspecified open wound, left lower leg, subsequent encounter: Secondary | ICD-10-CM | POA: Diagnosis not present

## 2020-06-21 DIAGNOSIS — I70202 Unspecified atherosclerosis of native arteries of extremities, left leg: Secondary | ICD-10-CM | POA: Diagnosis not present

## 2020-06-28 DIAGNOSIS — M6281 Muscle weakness (generalized): Secondary | ICD-10-CM | POA: Diagnosis not present

## 2020-06-28 DIAGNOSIS — Z89612 Acquired absence of left leg above knee: Secondary | ICD-10-CM | POA: Diagnosis not present

## 2020-06-28 DIAGNOSIS — R2689 Other abnormalities of gait and mobility: Secondary | ICD-10-CM | POA: Diagnosis not present

## 2020-07-05 DIAGNOSIS — M6281 Muscle weakness (generalized): Secondary | ICD-10-CM | POA: Diagnosis not present

## 2020-07-05 DIAGNOSIS — Z89612 Acquired absence of left leg above knee: Secondary | ICD-10-CM | POA: Diagnosis not present

## 2020-07-05 DIAGNOSIS — R2689 Other abnormalities of gait and mobility: Secondary | ICD-10-CM | POA: Diagnosis not present

## 2020-07-10 DIAGNOSIS — Z9181 History of falling: Secondary | ICD-10-CM | POA: Diagnosis not present

## 2020-07-10 DIAGNOSIS — E785 Hyperlipidemia, unspecified: Secondary | ICD-10-CM | POA: Diagnosis not present

## 2020-07-10 DIAGNOSIS — Z0000 Encounter for general adult medical examination without abnormal findings: Secondary | ICD-10-CM | POA: Diagnosis not present

## 2020-07-14 DIAGNOSIS — M545 Low back pain: Secondary | ICD-10-CM | POA: Diagnosis not present

## 2020-07-14 DIAGNOSIS — I70202 Unspecified atherosclerosis of native arteries of extremities, left leg: Secondary | ICD-10-CM | POA: Diagnosis not present

## 2020-07-14 DIAGNOSIS — R2689 Other abnormalities of gait and mobility: Secondary | ICD-10-CM | POA: Diagnosis not present

## 2020-07-14 DIAGNOSIS — M6281 Muscle weakness (generalized): Secondary | ICD-10-CM | POA: Diagnosis not present

## 2020-07-14 DIAGNOSIS — Z89612 Acquired absence of left leg above knee: Secondary | ICD-10-CM | POA: Diagnosis not present

## 2020-07-14 DIAGNOSIS — E1149 Type 2 diabetes mellitus with other diabetic neurological complication: Secondary | ICD-10-CM | POA: Diagnosis not present

## 2020-07-14 DIAGNOSIS — Z79899 Other long term (current) drug therapy: Secondary | ICD-10-CM | POA: Diagnosis not present

## 2020-07-21 DIAGNOSIS — R609 Edema, unspecified: Secondary | ICD-10-CM | POA: Diagnosis not present

## 2020-07-21 DIAGNOSIS — I70202 Unspecified atherosclerosis of native arteries of extremities, left leg: Secondary | ICD-10-CM | POA: Diagnosis not present

## 2020-07-21 DIAGNOSIS — J449 Chronic obstructive pulmonary disease, unspecified: Secondary | ICD-10-CM | POA: Diagnosis not present

## 2020-07-21 DIAGNOSIS — S81802D Unspecified open wound, left lower leg, subsequent encounter: Secondary | ICD-10-CM | POA: Diagnosis not present

## 2020-07-24 DIAGNOSIS — Z89612 Acquired absence of left leg above knee: Secondary | ICD-10-CM | POA: Diagnosis not present

## 2020-07-24 DIAGNOSIS — M6281 Muscle weakness (generalized): Secondary | ICD-10-CM | POA: Diagnosis not present

## 2020-07-24 DIAGNOSIS — R2689 Other abnormalities of gait and mobility: Secondary | ICD-10-CM | POA: Diagnosis not present

## 2020-08-02 DIAGNOSIS — R2689 Other abnormalities of gait and mobility: Secondary | ICD-10-CM | POA: Diagnosis not present

## 2020-08-02 DIAGNOSIS — Z89612 Acquired absence of left leg above knee: Secondary | ICD-10-CM | POA: Diagnosis not present

## 2020-08-02 DIAGNOSIS — M6281 Muscle weakness (generalized): Secondary | ICD-10-CM | POA: Diagnosis not present

## 2020-08-07 DIAGNOSIS — M6281 Muscle weakness (generalized): Secondary | ICD-10-CM | POA: Diagnosis not present

## 2020-08-07 DIAGNOSIS — R2689 Other abnormalities of gait and mobility: Secondary | ICD-10-CM | POA: Diagnosis not present

## 2020-08-07 DIAGNOSIS — Z89612 Acquired absence of left leg above knee: Secondary | ICD-10-CM | POA: Diagnosis not present

## 2020-08-17 DIAGNOSIS — R531 Weakness: Secondary | ICD-10-CM | POA: Diagnosis not present

## 2020-08-17 DIAGNOSIS — E1149 Type 2 diabetes mellitus with other diabetic neurological complication: Secondary | ICD-10-CM | POA: Diagnosis not present

## 2020-08-17 DIAGNOSIS — I70202 Unspecified atherosclerosis of native arteries of extremities, left leg: Secondary | ICD-10-CM | POA: Diagnosis not present

## 2020-08-17 DIAGNOSIS — Z89612 Acquired absence of left leg above knee: Secondary | ICD-10-CM | POA: Diagnosis not present

## 2020-08-17 DIAGNOSIS — N184 Chronic kidney disease, stage 4 (severe): Secondary | ICD-10-CM | POA: Diagnosis not present

## 2020-08-21 DIAGNOSIS — S81802D Unspecified open wound, left lower leg, subsequent encounter: Secondary | ICD-10-CM | POA: Diagnosis not present

## 2020-08-21 DIAGNOSIS — R609 Edema, unspecified: Secondary | ICD-10-CM | POA: Diagnosis not present

## 2020-08-21 DIAGNOSIS — I70202 Unspecified atherosclerosis of native arteries of extremities, left leg: Secondary | ICD-10-CM | POA: Diagnosis not present

## 2020-08-21 DIAGNOSIS — J449 Chronic obstructive pulmonary disease, unspecified: Secondary | ICD-10-CM | POA: Diagnosis not present

## 2020-08-23 DIAGNOSIS — M6281 Muscle weakness (generalized): Secondary | ICD-10-CM | POA: Diagnosis not present

## 2020-08-23 DIAGNOSIS — Z89612 Acquired absence of left leg above knee: Secondary | ICD-10-CM | POA: Diagnosis not present

## 2020-08-23 DIAGNOSIS — R2689 Other abnormalities of gait and mobility: Secondary | ICD-10-CM | POA: Diagnosis not present

## 2020-09-07 DIAGNOSIS — Z89612 Acquired absence of left leg above knee: Secondary | ICD-10-CM | POA: Diagnosis not present

## 2020-09-07 DIAGNOSIS — M6281 Muscle weakness (generalized): Secondary | ICD-10-CM | POA: Diagnosis not present

## 2020-09-07 DIAGNOSIS — R2689 Other abnormalities of gait and mobility: Secondary | ICD-10-CM | POA: Diagnosis not present

## 2020-09-14 DIAGNOSIS — Z89612 Acquired absence of left leg above knee: Secondary | ICD-10-CM | POA: Diagnosis not present

## 2020-09-18 DIAGNOSIS — Z23 Encounter for immunization: Secondary | ICD-10-CM | POA: Diagnosis not present

## 2020-09-18 DIAGNOSIS — E1149 Type 2 diabetes mellitus with other diabetic neurological complication: Secondary | ICD-10-CM | POA: Diagnosis not present

## 2020-09-18 DIAGNOSIS — M545 Low back pain: Secondary | ICD-10-CM | POA: Diagnosis not present

## 2020-09-18 DIAGNOSIS — Z89612 Acquired absence of left leg above knee: Secondary | ICD-10-CM | POA: Diagnosis not present

## 2020-09-21 DIAGNOSIS — S81802D Unspecified open wound, left lower leg, subsequent encounter: Secondary | ICD-10-CM | POA: Diagnosis not present

## 2020-09-21 DIAGNOSIS — R609 Edema, unspecified: Secondary | ICD-10-CM | POA: Diagnosis not present

## 2020-09-21 DIAGNOSIS — J449 Chronic obstructive pulmonary disease, unspecified: Secondary | ICD-10-CM | POA: Diagnosis not present

## 2020-09-21 DIAGNOSIS — I70202 Unspecified atherosclerosis of native arteries of extremities, left leg: Secondary | ICD-10-CM | POA: Diagnosis not present

## 2020-09-26 DIAGNOSIS — H2513 Age-related nuclear cataract, bilateral: Secondary | ICD-10-CM | POA: Diagnosis not present

## 2020-09-26 DIAGNOSIS — E119 Type 2 diabetes mellitus without complications: Secondary | ICD-10-CM | POA: Diagnosis not present

## 2020-09-27 DIAGNOSIS — N184 Chronic kidney disease, stage 4 (severe): Secondary | ICD-10-CM | POA: Diagnosis not present

## 2020-09-27 DIAGNOSIS — E1140 Type 2 diabetes mellitus with diabetic neuropathy, unspecified: Secondary | ICD-10-CM | POA: Diagnosis not present

## 2020-09-27 DIAGNOSIS — D509 Iron deficiency anemia, unspecified: Secondary | ICD-10-CM | POA: Diagnosis not present

## 2020-09-27 DIAGNOSIS — E538 Deficiency of other specified B group vitamins: Secondary | ICD-10-CM | POA: Diagnosis not present

## 2020-10-02 DIAGNOSIS — Z720 Tobacco use: Secondary | ICD-10-CM | POA: Diagnosis not present

## 2020-10-02 DIAGNOSIS — I129 Hypertensive chronic kidney disease with stage 1 through stage 4 chronic kidney disease, or unspecified chronic kidney disease: Secondary | ICD-10-CM | POA: Diagnosis not present

## 2020-10-02 DIAGNOSIS — N184 Chronic kidney disease, stage 4 (severe): Secondary | ICD-10-CM | POA: Diagnosis not present

## 2020-10-02 DIAGNOSIS — E1122 Type 2 diabetes mellitus with diabetic chronic kidney disease: Secondary | ICD-10-CM | POA: Diagnosis not present

## 2020-10-14 DIAGNOSIS — Z89612 Acquired absence of left leg above knee: Secondary | ICD-10-CM | POA: Diagnosis not present

## 2020-10-18 DIAGNOSIS — I70202 Unspecified atherosclerosis of native arteries of extremities, left leg: Secondary | ICD-10-CM | POA: Diagnosis not present

## 2020-10-18 DIAGNOSIS — E1149 Type 2 diabetes mellitus with other diabetic neurological complication: Secondary | ICD-10-CM | POA: Diagnosis not present

## 2020-10-18 DIAGNOSIS — Z79899 Other long term (current) drug therapy: Secondary | ICD-10-CM | POA: Diagnosis not present

## 2020-10-18 DIAGNOSIS — Z89612 Acquired absence of left leg above knee: Secondary | ICD-10-CM | POA: Diagnosis not present

## 2020-10-18 DIAGNOSIS — M545 Low back pain, unspecified: Secondary | ICD-10-CM | POA: Diagnosis not present

## 2020-11-14 DIAGNOSIS — Z89612 Acquired absence of left leg above knee: Secondary | ICD-10-CM | POA: Diagnosis not present

## 2020-11-21 DIAGNOSIS — E1149 Type 2 diabetes mellitus with other diabetic neurological complication: Secondary | ICD-10-CM | POA: Diagnosis not present

## 2020-11-21 DIAGNOSIS — N184 Chronic kidney disease, stage 4 (severe): Secondary | ICD-10-CM | POA: Diagnosis not present

## 2020-11-21 DIAGNOSIS — M545 Low back pain, unspecified: Secondary | ICD-10-CM | POA: Diagnosis not present

## 2020-11-21 DIAGNOSIS — Z89612 Acquired absence of left leg above knee: Secondary | ICD-10-CM | POA: Diagnosis not present

## 2020-11-21 DIAGNOSIS — I70202 Unspecified atherosclerosis of native arteries of extremities, left leg: Secondary | ICD-10-CM | POA: Diagnosis not present

## 2020-12-14 DIAGNOSIS — Z89612 Acquired absence of left leg above knee: Secondary | ICD-10-CM | POA: Diagnosis not present

## 2020-12-20 DIAGNOSIS — I70202 Unspecified atherosclerosis of native arteries of extremities, left leg: Secondary | ICD-10-CM | POA: Diagnosis not present

## 2020-12-20 DIAGNOSIS — M545 Low back pain, unspecified: Secondary | ICD-10-CM | POA: Diagnosis not present

## 2020-12-20 DIAGNOSIS — Z89612 Acquired absence of left leg above knee: Secondary | ICD-10-CM | POA: Diagnosis not present

## 2020-12-20 DIAGNOSIS — E1149 Type 2 diabetes mellitus with other diabetic neurological complication: Secondary | ICD-10-CM | POA: Diagnosis not present

## 2020-12-20 DIAGNOSIS — N184 Chronic kidney disease, stage 4 (severe): Secondary | ICD-10-CM | POA: Diagnosis not present

## 2021-01-14 DIAGNOSIS — Z89612 Acquired absence of left leg above knee: Secondary | ICD-10-CM | POA: Diagnosis not present

## 2021-01-22 DIAGNOSIS — Z89612 Acquired absence of left leg above knee: Secondary | ICD-10-CM | POA: Diagnosis not present

## 2021-01-22 DIAGNOSIS — Z139 Encounter for screening, unspecified: Secondary | ICD-10-CM | POA: Diagnosis not present

## 2021-01-22 DIAGNOSIS — N184 Chronic kidney disease, stage 4 (severe): Secondary | ICD-10-CM | POA: Diagnosis not present

## 2021-01-22 DIAGNOSIS — E1149 Type 2 diabetes mellitus with other diabetic neurological complication: Secondary | ICD-10-CM | POA: Diagnosis not present

## 2021-01-22 DIAGNOSIS — Z79899 Other long term (current) drug therapy: Secondary | ICD-10-CM | POA: Diagnosis not present

## 2021-01-22 DIAGNOSIS — D509 Iron deficiency anemia, unspecified: Secondary | ICD-10-CM | POA: Diagnosis not present

## 2021-01-22 DIAGNOSIS — M545 Low back pain, unspecified: Secondary | ICD-10-CM | POA: Diagnosis not present

## 2021-01-22 DIAGNOSIS — I70202 Unspecified atherosclerosis of native arteries of extremities, left leg: Secondary | ICD-10-CM | POA: Diagnosis not present

## 2021-01-22 DIAGNOSIS — E538 Deficiency of other specified B group vitamins: Secondary | ICD-10-CM | POA: Diagnosis not present

## 2021-01-22 DIAGNOSIS — I739 Peripheral vascular disease, unspecified: Secondary | ICD-10-CM | POA: Diagnosis not present

## 2021-02-06 DIAGNOSIS — N184 Chronic kidney disease, stage 4 (severe): Secondary | ICD-10-CM | POA: Diagnosis not present

## 2021-02-06 DIAGNOSIS — Z720 Tobacco use: Secondary | ICD-10-CM | POA: Diagnosis not present

## 2021-02-06 DIAGNOSIS — E1122 Type 2 diabetes mellitus with diabetic chronic kidney disease: Secondary | ICD-10-CM | POA: Diagnosis not present

## 2021-02-06 DIAGNOSIS — I129 Hypertensive chronic kidney disease with stage 1 through stage 4 chronic kidney disease, or unspecified chronic kidney disease: Secondary | ICD-10-CM | POA: Diagnosis not present

## 2021-02-14 DIAGNOSIS — Z89612 Acquired absence of left leg above knee: Secondary | ICD-10-CM | POA: Diagnosis not present

## 2021-02-22 DIAGNOSIS — M545 Low back pain, unspecified: Secondary | ICD-10-CM | POA: Diagnosis not present

## 2021-02-22 DIAGNOSIS — I70202 Unspecified atherosclerosis of native arteries of extremities, left leg: Secondary | ICD-10-CM | POA: Diagnosis not present

## 2021-02-22 DIAGNOSIS — Z89612 Acquired absence of left leg above knee: Secondary | ICD-10-CM | POA: Diagnosis not present

## 2021-02-22 DIAGNOSIS — E1149 Type 2 diabetes mellitus with other diabetic neurological complication: Secondary | ICD-10-CM | POA: Diagnosis not present

## 2021-03-14 DIAGNOSIS — Z4781 Encounter for orthopedic aftercare following surgical amputation: Secondary | ICD-10-CM | POA: Diagnosis not present

## 2021-03-14 DIAGNOSIS — Z89612 Acquired absence of left leg above knee: Secondary | ICD-10-CM | POA: Diagnosis not present

## 2021-03-14 DIAGNOSIS — N189 Chronic kidney disease, unspecified: Secondary | ICD-10-CM | POA: Diagnosis not present

## 2021-03-14 DIAGNOSIS — M792 Neuralgia and neuritis, unspecified: Secondary | ICD-10-CM | POA: Diagnosis not present

## 2021-03-14 DIAGNOSIS — Z7984 Long term (current) use of oral hypoglycemic drugs: Secondary | ICD-10-CM | POA: Diagnosis not present

## 2021-03-14 DIAGNOSIS — M7989 Other specified soft tissue disorders: Secondary | ICD-10-CM | POA: Diagnosis not present

## 2021-03-14 DIAGNOSIS — M79605 Pain in left leg: Secondary | ICD-10-CM | POA: Diagnosis not present

## 2021-03-14 DIAGNOSIS — E1122 Type 2 diabetes mellitus with diabetic chronic kidney disease: Secondary | ICD-10-CM | POA: Diagnosis not present

## 2021-03-22 DIAGNOSIS — M545 Low back pain, unspecified: Secondary | ICD-10-CM | POA: Diagnosis not present

## 2021-03-22 DIAGNOSIS — Z89612 Acquired absence of left leg above knee: Secondary | ICD-10-CM | POA: Diagnosis not present

## 2021-03-22 DIAGNOSIS — E1149 Type 2 diabetes mellitus with other diabetic neurological complication: Secondary | ICD-10-CM | POA: Diagnosis not present

## 2021-03-22 DIAGNOSIS — I70202 Unspecified atherosclerosis of native arteries of extremities, left leg: Secondary | ICD-10-CM | POA: Diagnosis not present

## 2021-03-22 DIAGNOSIS — M792 Neuralgia and neuritis, unspecified: Secondary | ICD-10-CM | POA: Diagnosis not present

## 2021-04-04 DIAGNOSIS — Z89612 Acquired absence of left leg above knee: Secondary | ICD-10-CM | POA: Diagnosis not present

## 2021-04-04 DIAGNOSIS — S78112A Complete traumatic amputation at level between left hip and knee, initial encounter: Secondary | ICD-10-CM | POA: Diagnosis not present

## 2021-04-04 DIAGNOSIS — Z4781 Encounter for orthopedic aftercare following surgical amputation: Secondary | ICD-10-CM | POA: Diagnosis not present

## 2021-04-14 DIAGNOSIS — Z89612 Acquired absence of left leg above knee: Secondary | ICD-10-CM | POA: Diagnosis not present

## 2021-04-23 DIAGNOSIS — M792 Neuralgia and neuritis, unspecified: Secondary | ICD-10-CM | POA: Diagnosis not present

## 2021-04-23 DIAGNOSIS — M545 Low back pain, unspecified: Secondary | ICD-10-CM | POA: Diagnosis not present

## 2021-04-23 DIAGNOSIS — I70202 Unspecified atherosclerosis of native arteries of extremities, left leg: Secondary | ICD-10-CM | POA: Diagnosis not present

## 2021-04-23 DIAGNOSIS — E1149 Type 2 diabetes mellitus with other diabetic neurological complication: Secondary | ICD-10-CM | POA: Diagnosis not present

## 2021-04-23 DIAGNOSIS — Z79899 Other long term (current) drug therapy: Secondary | ICD-10-CM | POA: Diagnosis not present

## 2021-04-23 DIAGNOSIS — Z89612 Acquired absence of left leg above knee: Secondary | ICD-10-CM | POA: Diagnosis not present

## 2021-05-14 DIAGNOSIS — Z89612 Acquired absence of left leg above knee: Secondary | ICD-10-CM | POA: Diagnosis not present

## 2021-05-29 DIAGNOSIS — I70202 Unspecified atherosclerosis of native arteries of extremities, left leg: Secondary | ICD-10-CM | POA: Diagnosis not present

## 2021-05-29 DIAGNOSIS — M792 Neuralgia and neuritis, unspecified: Secondary | ICD-10-CM | POA: Diagnosis not present

## 2021-05-29 DIAGNOSIS — E538 Deficiency of other specified B group vitamins: Secondary | ICD-10-CM | POA: Diagnosis not present

## 2021-05-29 DIAGNOSIS — R911 Solitary pulmonary nodule: Secondary | ICD-10-CM | POA: Diagnosis not present

## 2021-05-29 DIAGNOSIS — Z79899 Other long term (current) drug therapy: Secondary | ICD-10-CM | POA: Diagnosis not present

## 2021-05-29 DIAGNOSIS — E782 Mixed hyperlipidemia: Secondary | ICD-10-CM | POA: Diagnosis not present

## 2021-05-29 DIAGNOSIS — Z89612 Acquired absence of left leg above knee: Secondary | ICD-10-CM | POA: Diagnosis not present

## 2021-05-29 DIAGNOSIS — E1149 Type 2 diabetes mellitus with other diabetic neurological complication: Secondary | ICD-10-CM | POA: Diagnosis not present

## 2021-05-29 DIAGNOSIS — M545 Low back pain, unspecified: Secondary | ICD-10-CM | POA: Diagnosis not present

## 2021-05-29 DIAGNOSIS — N184 Chronic kidney disease, stage 4 (severe): Secondary | ICD-10-CM | POA: Diagnosis not present

## 2021-05-29 DIAGNOSIS — D509 Iron deficiency anemia, unspecified: Secondary | ICD-10-CM | POA: Diagnosis not present

## 2021-05-30 ENCOUNTER — Other Ambulatory Visit: Payer: Self-pay | Admitting: Nurse Practitioner

## 2021-05-30 DIAGNOSIS — F17200 Nicotine dependence, unspecified, uncomplicated: Secondary | ICD-10-CM

## 2021-05-30 DIAGNOSIS — Z87891 Personal history of nicotine dependence: Secondary | ICD-10-CM

## 2021-06-11 DIAGNOSIS — N184 Chronic kidney disease, stage 4 (severe): Secondary | ICD-10-CM | POA: Diagnosis not present

## 2021-06-11 DIAGNOSIS — Z720 Tobacco use: Secondary | ICD-10-CM | POA: Diagnosis not present

## 2021-06-11 DIAGNOSIS — I129 Hypertensive chronic kidney disease with stage 1 through stage 4 chronic kidney disease, or unspecified chronic kidney disease: Secondary | ICD-10-CM | POA: Diagnosis not present

## 2021-06-11 DIAGNOSIS — E1122 Type 2 diabetes mellitus with diabetic chronic kidney disease: Secondary | ICD-10-CM | POA: Diagnosis not present

## 2021-06-14 DIAGNOSIS — Z89612 Acquired absence of left leg above knee: Secondary | ICD-10-CM | POA: Diagnosis not present

## 2021-06-18 ENCOUNTER — Ambulatory Visit
Admission: RE | Admit: 2021-06-18 | Discharge: 2021-06-18 | Disposition: A | Discharge status: Home / Self Care | Payer: PPO | Source: Ambulatory Visit | Attending: Nurse Practitioner | Admitting: Nurse Practitioner

## 2021-06-18 DIAGNOSIS — F17210 Nicotine dependence, cigarettes, uncomplicated: Secondary | ICD-10-CM | POA: Diagnosis not present

## 2021-06-18 DIAGNOSIS — F17200 Nicotine dependence, unspecified, uncomplicated: Secondary | ICD-10-CM

## 2021-06-18 DIAGNOSIS — Z87891 Personal history of nicotine dependence: Secondary | ICD-10-CM

## 2021-06-27 DIAGNOSIS — Z89612 Acquired absence of left leg above knee: Secondary | ICD-10-CM | POA: Diagnosis not present

## 2021-06-28 DIAGNOSIS — I70202 Unspecified atherosclerosis of native arteries of extremities, left leg: Secondary | ICD-10-CM | POA: Diagnosis not present

## 2021-06-28 DIAGNOSIS — M792 Neuralgia and neuritis, unspecified: Secondary | ICD-10-CM | POA: Diagnosis not present

## 2021-06-28 DIAGNOSIS — E1149 Type 2 diabetes mellitus with other diabetic neurological complication: Secondary | ICD-10-CM | POA: Diagnosis not present

## 2021-06-28 DIAGNOSIS — Z79899 Other long term (current) drug therapy: Secondary | ICD-10-CM | POA: Diagnosis not present

## 2021-06-28 DIAGNOSIS — M545 Low back pain, unspecified: Secondary | ICD-10-CM | POA: Diagnosis not present

## 2021-06-28 DIAGNOSIS — Z89612 Acquired absence of left leg above knee: Secondary | ICD-10-CM | POA: Diagnosis not present

## 2021-07-12 DIAGNOSIS — Z9181 History of falling: Secondary | ICD-10-CM | POA: Diagnosis not present

## 2021-07-12 DIAGNOSIS — Z0000 Encounter for general adult medical examination without abnormal findings: Secondary | ICD-10-CM | POA: Diagnosis not present

## 2021-07-12 DIAGNOSIS — E785 Hyperlipidemia, unspecified: Secondary | ICD-10-CM | POA: Diagnosis not present

## 2021-07-27 DIAGNOSIS — I70202 Unspecified atherosclerosis of native arteries of extremities, left leg: Secondary | ICD-10-CM | POA: Diagnosis not present

## 2021-07-27 DIAGNOSIS — E1149 Type 2 diabetes mellitus with other diabetic neurological complication: Secondary | ICD-10-CM | POA: Diagnosis not present

## 2021-07-27 DIAGNOSIS — M545 Low back pain, unspecified: Secondary | ICD-10-CM | POA: Diagnosis not present

## 2021-07-27 DIAGNOSIS — Z89612 Acquired absence of left leg above knee: Secondary | ICD-10-CM | POA: Diagnosis not present

## 2021-07-27 DIAGNOSIS — M792 Neuralgia and neuritis, unspecified: Secondary | ICD-10-CM | POA: Diagnosis not present

## 2021-08-21 DIAGNOSIS — Z79891 Long term (current) use of opiate analgesic: Secondary | ICD-10-CM | POA: Diagnosis not present

## 2021-08-21 DIAGNOSIS — T8789 Other complications of amputation stump: Secondary | ICD-10-CM | POA: Diagnosis not present

## 2021-08-21 DIAGNOSIS — Z79899 Other long term (current) drug therapy: Secondary | ICD-10-CM | POA: Diagnosis not present

## 2021-08-21 DIAGNOSIS — M4727 Other spondylosis with radiculopathy, lumbosacral region: Secondary | ICD-10-CM | POA: Diagnosis not present

## 2021-08-21 DIAGNOSIS — G894 Chronic pain syndrome: Secondary | ICD-10-CM | POA: Diagnosis not present

## 2021-08-29 DIAGNOSIS — R809 Proteinuria, unspecified: Secondary | ICD-10-CM | POA: Diagnosis not present

## 2021-08-29 DIAGNOSIS — D692 Other nonthrombocytopenic purpura: Secondary | ICD-10-CM | POA: Diagnosis not present

## 2021-08-29 DIAGNOSIS — E1129 Type 2 diabetes mellitus with other diabetic kidney complication: Secondary | ICD-10-CM | POA: Diagnosis not present

## 2021-08-29 DIAGNOSIS — N184 Chronic kidney disease, stage 4 (severe): Secondary | ICD-10-CM | POA: Diagnosis not present

## 2021-08-29 DIAGNOSIS — S78119A Complete traumatic amputation at level between unspecified hip and knee, initial encounter: Secondary | ICD-10-CM | POA: Diagnosis not present

## 2021-08-29 DIAGNOSIS — E1151 Type 2 diabetes mellitus with diabetic peripheral angiopathy without gangrene: Secondary | ICD-10-CM | POA: Diagnosis not present

## 2021-08-29 DIAGNOSIS — E782 Mixed hyperlipidemia: Secondary | ICD-10-CM | POA: Diagnosis not present

## 2021-08-29 DIAGNOSIS — I739 Peripheral vascular disease, unspecified: Secondary | ICD-10-CM | POA: Diagnosis not present

## 2021-08-29 DIAGNOSIS — I5030 Unspecified diastolic (congestive) heart failure: Secondary | ICD-10-CM | POA: Diagnosis not present

## 2021-08-29 DIAGNOSIS — I10 Essential (primary) hypertension: Secondary | ICD-10-CM | POA: Diagnosis not present

## 2021-08-29 DIAGNOSIS — I700 Atherosclerosis of aorta: Secondary | ICD-10-CM | POA: Diagnosis not present

## 2021-09-19 DIAGNOSIS — N184 Chronic kidney disease, stage 4 (severe): Secondary | ICD-10-CM | POA: Diagnosis not present

## 2021-09-19 DIAGNOSIS — E782 Mixed hyperlipidemia: Secondary | ICD-10-CM | POA: Diagnosis not present

## 2021-09-25 DIAGNOSIS — T8789 Other complications of amputation stump: Secondary | ICD-10-CM | POA: Diagnosis not present

## 2021-09-25 DIAGNOSIS — G894 Chronic pain syndrome: Secondary | ICD-10-CM | POA: Diagnosis not present

## 2021-09-25 DIAGNOSIS — M4727 Other spondylosis with radiculopathy, lumbosacral region: Secondary | ICD-10-CM | POA: Diagnosis not present

## 2021-10-15 DIAGNOSIS — I129 Hypertensive chronic kidney disease with stage 1 through stage 4 chronic kidney disease, or unspecified chronic kidney disease: Secondary | ICD-10-CM | POA: Diagnosis not present

## 2021-10-15 DIAGNOSIS — N184 Chronic kidney disease, stage 4 (severe): Secondary | ICD-10-CM | POA: Diagnosis not present

## 2021-10-15 DIAGNOSIS — Z720 Tobacco use: Secondary | ICD-10-CM | POA: Diagnosis not present

## 2021-10-15 DIAGNOSIS — E1122 Type 2 diabetes mellitus with diabetic chronic kidney disease: Secondary | ICD-10-CM | POA: Diagnosis not present

## 2021-10-25 DIAGNOSIS — T8789 Other complications of amputation stump: Secondary | ICD-10-CM | POA: Diagnosis not present

## 2021-10-25 DIAGNOSIS — G894 Chronic pain syndrome: Secondary | ICD-10-CM | POA: Diagnosis not present

## 2021-10-25 DIAGNOSIS — M4727 Other spondylosis with radiculopathy, lumbosacral region: Secondary | ICD-10-CM | POA: Diagnosis not present

## 2021-10-29 DIAGNOSIS — Z23 Encounter for immunization: Secondary | ICD-10-CM | POA: Diagnosis not present

## 2021-11-26 DIAGNOSIS — T8789 Other complications of amputation stump: Secondary | ICD-10-CM | POA: Diagnosis not present

## 2021-11-26 DIAGNOSIS — Z79891 Long term (current) use of opiate analgesic: Secondary | ICD-10-CM | POA: Diagnosis not present

## 2021-11-26 DIAGNOSIS — M4727 Other spondylosis with radiculopathy, lumbosacral region: Secondary | ICD-10-CM | POA: Diagnosis not present

## 2021-11-26 DIAGNOSIS — G894 Chronic pain syndrome: Secondary | ICD-10-CM | POA: Diagnosis not present

## 2021-12-26 DIAGNOSIS — G894 Chronic pain syndrome: Secondary | ICD-10-CM | POA: Diagnosis not present

## 2021-12-26 DIAGNOSIS — T8789 Other complications of amputation stump: Secondary | ICD-10-CM | POA: Diagnosis not present

## 2021-12-26 DIAGNOSIS — M4727 Other spondylosis with radiculopathy, lumbosacral region: Secondary | ICD-10-CM | POA: Diagnosis not present

## 2022-01-07 DIAGNOSIS — Z89612 Acquired absence of left leg above knee: Secondary | ICD-10-CM | POA: Diagnosis not present

## 2022-01-07 DIAGNOSIS — E782 Mixed hyperlipidemia: Secondary | ICD-10-CM | POA: Diagnosis not present

## 2022-01-07 DIAGNOSIS — I700 Atherosclerosis of aorta: Secondary | ICD-10-CM | POA: Diagnosis not present

## 2022-01-07 DIAGNOSIS — I70202 Unspecified atherosclerosis of native arteries of extremities, left leg: Secondary | ICD-10-CM | POA: Diagnosis not present

## 2022-01-07 DIAGNOSIS — J439 Emphysema, unspecified: Secondary | ICD-10-CM | POA: Diagnosis not present

## 2022-01-07 DIAGNOSIS — N184 Chronic kidney disease, stage 4 (severe): Secondary | ICD-10-CM | POA: Diagnosis not present

## 2022-01-07 DIAGNOSIS — I739 Peripheral vascular disease, unspecified: Secondary | ICD-10-CM | POA: Diagnosis not present

## 2022-01-07 DIAGNOSIS — I5030 Unspecified diastolic (congestive) heart failure: Secondary | ICD-10-CM | POA: Diagnosis not present

## 2022-01-07 DIAGNOSIS — E1149 Type 2 diabetes mellitus with other diabetic neurological complication: Secondary | ICD-10-CM | POA: Diagnosis not present

## 2022-01-07 DIAGNOSIS — Z79899 Other long term (current) drug therapy: Secondary | ICD-10-CM | POA: Diagnosis not present

## 2022-02-05 DIAGNOSIS — M961 Postlaminectomy syndrome, not elsewhere classified: Secondary | ICD-10-CM | POA: Diagnosis not present

## 2022-02-05 DIAGNOSIS — M4727 Other spondylosis with radiculopathy, lumbosacral region: Secondary | ICD-10-CM | POA: Diagnosis not present

## 2022-02-05 DIAGNOSIS — G894 Chronic pain syndrome: Secondary | ICD-10-CM | POA: Diagnosis not present

## 2022-02-05 DIAGNOSIS — Z79891 Long term (current) use of opiate analgesic: Secondary | ICD-10-CM | POA: Diagnosis not present

## 2022-02-05 DIAGNOSIS — G546 Phantom limb syndrome with pain: Secondary | ICD-10-CM | POA: Diagnosis not present

## 2022-02-05 DIAGNOSIS — Z79899 Other long term (current) drug therapy: Secondary | ICD-10-CM | POA: Diagnosis not present

## 2022-02-18 DIAGNOSIS — E119 Type 2 diabetes mellitus without complications: Secondary | ICD-10-CM | POA: Diagnosis not present

## 2022-02-18 DIAGNOSIS — H2513 Age-related nuclear cataract, bilateral: Secondary | ICD-10-CM | POA: Diagnosis not present

## 2022-02-19 DIAGNOSIS — E1122 Type 2 diabetes mellitus with diabetic chronic kidney disease: Secondary | ICD-10-CM | POA: Diagnosis not present

## 2022-02-19 DIAGNOSIS — I129 Hypertensive chronic kidney disease with stage 1 through stage 4 chronic kidney disease, or unspecified chronic kidney disease: Secondary | ICD-10-CM | POA: Diagnosis not present

## 2022-02-19 DIAGNOSIS — N184 Chronic kidney disease, stage 4 (severe): Secondary | ICD-10-CM | POA: Diagnosis not present

## 2022-02-19 DIAGNOSIS — Z720 Tobacco use: Secondary | ICD-10-CM | POA: Diagnosis not present

## 2022-03-14 DIAGNOSIS — G894 Chronic pain syndrome: Secondary | ICD-10-CM | POA: Diagnosis not present

## 2022-04-03 DIAGNOSIS — F17210 Nicotine dependence, cigarettes, uncomplicated: Secondary | ICD-10-CM | POA: Diagnosis not present

## 2022-04-03 DIAGNOSIS — M545 Low back pain, unspecified: Secondary | ICD-10-CM | POA: Diagnosis not present

## 2022-04-03 DIAGNOSIS — Z79899 Other long term (current) drug therapy: Secondary | ICD-10-CM | POA: Diagnosis not present

## 2022-04-03 DIAGNOSIS — G546 Phantom limb syndrome with pain: Secondary | ICD-10-CM | POA: Diagnosis not present

## 2022-04-08 DIAGNOSIS — I700 Atherosclerosis of aorta: Secondary | ICD-10-CM | POA: Diagnosis not present

## 2022-04-08 DIAGNOSIS — E1149 Type 2 diabetes mellitus with other diabetic neurological complication: Secondary | ICD-10-CM | POA: Diagnosis not present

## 2022-04-08 DIAGNOSIS — I10 Essential (primary) hypertension: Secondary | ICD-10-CM | POA: Diagnosis not present

## 2022-04-08 DIAGNOSIS — Z79899 Other long term (current) drug therapy: Secondary | ICD-10-CM | POA: Diagnosis not present

## 2022-04-08 DIAGNOSIS — E782 Mixed hyperlipidemia: Secondary | ICD-10-CM | POA: Diagnosis not present

## 2022-04-08 DIAGNOSIS — N184 Chronic kidney disease, stage 4 (severe): Secondary | ICD-10-CM | POA: Diagnosis not present

## 2022-04-08 DIAGNOSIS — I5030 Unspecified diastolic (congestive) heart failure: Secondary | ICD-10-CM | POA: Diagnosis not present

## 2022-04-08 DIAGNOSIS — Z89612 Acquired absence of left leg above knee: Secondary | ICD-10-CM | POA: Diagnosis not present

## 2022-04-08 DIAGNOSIS — Z23 Encounter for immunization: Secondary | ICD-10-CM | POA: Diagnosis not present

## 2022-04-08 DIAGNOSIS — I739 Peripheral vascular disease, unspecified: Secondary | ICD-10-CM | POA: Diagnosis not present

## 2022-04-08 DIAGNOSIS — J439 Emphysema, unspecified: Secondary | ICD-10-CM | POA: Diagnosis not present

## 2022-05-01 DIAGNOSIS — Z0000 Encounter for general adult medical examination without abnormal findings: Secondary | ICD-10-CM | POA: Diagnosis not present

## 2022-05-01 DIAGNOSIS — Z79891 Long term (current) use of opiate analgesic: Secondary | ICD-10-CM | POA: Diagnosis not present

## 2022-05-01 DIAGNOSIS — Z79899 Other long term (current) drug therapy: Secondary | ICD-10-CM | POA: Diagnosis not present

## 2022-05-01 DIAGNOSIS — G546 Phantom limb syndrome with pain: Secondary | ICD-10-CM | POA: Diagnosis not present

## 2022-05-01 DIAGNOSIS — R030 Elevated blood-pressure reading, without diagnosis of hypertension: Secondary | ICD-10-CM | POA: Diagnosis not present

## 2022-05-01 DIAGNOSIS — M545 Low back pain, unspecified: Secondary | ICD-10-CM | POA: Diagnosis not present

## 2022-06-03 DIAGNOSIS — R030 Elevated blood-pressure reading, without diagnosis of hypertension: Secondary | ICD-10-CM | POA: Diagnosis not present

## 2022-06-03 DIAGNOSIS — M545 Low back pain, unspecified: Secondary | ICD-10-CM | POA: Diagnosis not present

## 2022-06-03 DIAGNOSIS — G546 Phantom limb syndrome with pain: Secondary | ICD-10-CM | POA: Diagnosis not present

## 2022-06-03 DIAGNOSIS — Z79899 Other long term (current) drug therapy: Secondary | ICD-10-CM | POA: Diagnosis not present

## 2022-06-07 ENCOUNTER — Other Ambulatory Visit: Payer: Self-pay | Admitting: Family Medicine

## 2022-06-07 DIAGNOSIS — Z87891 Personal history of nicotine dependence: Secondary | ICD-10-CM

## 2022-06-24 DIAGNOSIS — E1122 Type 2 diabetes mellitus with diabetic chronic kidney disease: Secondary | ICD-10-CM | POA: Diagnosis not present

## 2022-06-24 DIAGNOSIS — I129 Hypertensive chronic kidney disease with stage 1 through stage 4 chronic kidney disease, or unspecified chronic kidney disease: Secondary | ICD-10-CM | POA: Diagnosis not present

## 2022-06-24 DIAGNOSIS — Z720 Tobacco use: Secondary | ICD-10-CM | POA: Diagnosis not present

## 2022-06-24 DIAGNOSIS — N184 Chronic kidney disease, stage 4 (severe): Secondary | ICD-10-CM | POA: Diagnosis not present

## 2022-06-28 ENCOUNTER — Ambulatory Visit
Admission: RE | Admit: 2022-06-28 | Discharge: 2022-06-28 | Disposition: A | Discharge status: Home / Self Care | Payer: Medicare Other | Source: Ambulatory Visit | Attending: Family Medicine | Admitting: Family Medicine

## 2022-06-28 DIAGNOSIS — Z87891 Personal history of nicotine dependence: Secondary | ICD-10-CM

## 2022-06-28 DIAGNOSIS — F17210 Nicotine dependence, cigarettes, uncomplicated: Secondary | ICD-10-CM | POA: Diagnosis not present

## 2022-07-09 DIAGNOSIS — R030 Elevated blood-pressure reading, without diagnosis of hypertension: Secondary | ICD-10-CM | POA: Diagnosis not present

## 2022-07-09 DIAGNOSIS — M545 Low back pain, unspecified: Secondary | ICD-10-CM | POA: Diagnosis not present

## 2022-07-09 DIAGNOSIS — G546 Phantom limb syndrome with pain: Secondary | ICD-10-CM | POA: Diagnosis not present

## 2022-07-09 DIAGNOSIS — Z79891 Long term (current) use of opiate analgesic: Secondary | ICD-10-CM | POA: Diagnosis not present

## 2022-07-09 DIAGNOSIS — Z79899 Other long term (current) drug therapy: Secondary | ICD-10-CM | POA: Diagnosis not present

## 2022-07-11 DIAGNOSIS — Z79899 Other long term (current) drug therapy: Secondary | ICD-10-CM | POA: Diagnosis not present

## 2022-07-29 DIAGNOSIS — E785 Hyperlipidemia, unspecified: Secondary | ICD-10-CM | POA: Diagnosis not present

## 2022-07-29 DIAGNOSIS — Z0000 Encounter for general adult medical examination without abnormal findings: Secondary | ICD-10-CM | POA: Diagnosis not present

## 2022-07-29 DIAGNOSIS — Z9181 History of falling: Secondary | ICD-10-CM | POA: Diagnosis not present

## 2022-07-29 DIAGNOSIS — Z139 Encounter for screening, unspecified: Secondary | ICD-10-CM | POA: Diagnosis not present

## 2022-08-08 DIAGNOSIS — N184 Chronic kidney disease, stage 4 (severe): Secondary | ICD-10-CM | POA: Diagnosis not present

## 2022-08-08 DIAGNOSIS — E1151 Type 2 diabetes mellitus with diabetic peripheral angiopathy without gangrene: Secondary | ICD-10-CM | POA: Diagnosis not present

## 2022-08-08 DIAGNOSIS — Z79899 Other long term (current) drug therapy: Secondary | ICD-10-CM | POA: Diagnosis not present

## 2022-08-08 DIAGNOSIS — D692 Other nonthrombocytopenic purpura: Secondary | ICD-10-CM | POA: Diagnosis not present

## 2022-08-08 DIAGNOSIS — E782 Mixed hyperlipidemia: Secondary | ICD-10-CM | POA: Diagnosis not present

## 2022-08-08 DIAGNOSIS — J439 Emphysema, unspecified: Secondary | ICD-10-CM | POA: Diagnosis not present

## 2022-08-08 DIAGNOSIS — I10 Essential (primary) hypertension: Secondary | ICD-10-CM | POA: Diagnosis not present

## 2022-08-09 DIAGNOSIS — M545 Low back pain, unspecified: Secondary | ICD-10-CM | POA: Diagnosis not present

## 2022-08-09 DIAGNOSIS — G546 Phantom limb syndrome with pain: Secondary | ICD-10-CM | POA: Diagnosis not present

## 2022-08-09 DIAGNOSIS — Z79891 Long term (current) use of opiate analgesic: Secondary | ICD-10-CM | POA: Diagnosis not present

## 2022-08-09 DIAGNOSIS — Z79899 Other long term (current) drug therapy: Secondary | ICD-10-CM | POA: Diagnosis not present

## 2022-08-09 DIAGNOSIS — R030 Elevated blood-pressure reading, without diagnosis of hypertension: Secondary | ICD-10-CM | POA: Diagnosis not present

## 2022-08-29 ENCOUNTER — Telehealth: Payer: Self-pay

## 2022-08-29 NOTE — Patient Outreach (Signed)
  Care Coordination   Initial Visit Note   08/29/2022 Name: Erik Martin MRN: [id number] DOB: 1951/07/23  Erik Martin is a 71 y.o. year old male who sees Lam, Rudi Rummage, NP for primary care. I spoke with  Erik Martin by phone today.  Patient provided me permission to speak with wife Erik Martin due to his hearing problems on the phone.  What matters to the patients health and wellness today?  Explained [facility] care coordination program to wife. Wife states patient is having problems with cost of medications. Over 147.09 for Trulicity.       SDOH assessments and interventions completed:       Care Coordination Interventions Activated:    Care Coordination Interventions:     Follow up plan: Referral made to pharmacy    Encounter Outcome:  Pt. Visit Completed   Tomasa Rand, RN, BSN, CEN [place] Coordinator [phone]

## 2022-08-30 ENCOUNTER — Telehealth: Payer: Self-pay

## 2022-08-30 DIAGNOSIS — Z596 Low income: Secondary | ICD-10-CM

## 2022-08-30 NOTE — Progress Notes (Addendum)
[place] [facility])  [place] Team    08/30/2022  Erik Martin Dec 17, 1951 [id number]  Reason for referral: Medication Assistance  Outreach:  Successful telephone call with Mrs. Erik Martin since he has hearing loss. HIPAA identifiers verified.   Subjective:  [place] team received a medication assistance referral for Trulicity since patient is reportedly paying $250 for 30 days supply. Per Erik Martin, they have been paying $250 for four months. Erik Martin states they completed a PAP application but was told they were no longer accepting per manufacturer three months ago. They also applied for LIS in June and she claims they weren't accepting applications at that time for LIS.   Trulicity is no longer accepting new applicants and suggested Ozempic. 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 I also suggested patient to reach out to PCP. Given income, patient will need to apply for LIS but she deferred this pharmacist assistance. Will plan to follow-up with Erik Martin next week for LIS application and discussion with nephrologist and PCP.   Will send fax to PCP and nephrologist.    Will follow up next week to help with PAP application.   Thank you for allowing pharmacy to be a part of this patient's care.  Kristeen Miss, PharmD Clinical Pharmacist [place] Cell: [phone]

## 2022-09-10 DIAGNOSIS — M545 Low back pain, unspecified: Secondary | ICD-10-CM | POA: Diagnosis not present

## 2022-09-10 DIAGNOSIS — R030 Elevated blood-pressure reading, without diagnosis of hypertension: Secondary | ICD-10-CM | POA: Diagnosis not present

## 2022-09-10 DIAGNOSIS — M546 Pain in thoracic spine: Secondary | ICD-10-CM | POA: Diagnosis not present

## 2022-09-10 DIAGNOSIS — Z79891 Long term (current) use of opiate analgesic: Secondary | ICD-10-CM | POA: Diagnosis not present

## 2022-09-16 NOTE — Progress Notes (Signed)
[place] [facility])  [place] Team   Reason for referral: medication assistance  [facility] pharmacy referral is being closed due to the following reasons:  -Will close [place] case as I have been unable to engage or maintain contact with patient -I have provided my contact information if patient or family needs to reach out to me in the future.  -Thank you for allowing [facility] pharmacy to be involved in this patient's care.   [place] is happy to assist the patient/family in the future for clinical pharmacy needs, following a discussion from your team about [place] outreach. Thank you for allowing [facility] to be a part of your patient's care.   Kristeen Miss, PharmD Clinical Pharmacist [place] Cell: [phone]

## 2022-10-10 DIAGNOSIS — R030 Elevated blood-pressure reading, without diagnosis of hypertension: Secondary | ICD-10-CM | POA: Diagnosis not present

## 2022-10-10 DIAGNOSIS — Z79899 Other long term (current) drug therapy: Secondary | ICD-10-CM | POA: Diagnosis not present

## 2022-10-10 DIAGNOSIS — G546 Phantom limb syndrome with pain: Secondary | ICD-10-CM | POA: Diagnosis not present

## 2022-10-10 DIAGNOSIS — Z79891 Long term (current) use of opiate analgesic: Secondary | ICD-10-CM | POA: Diagnosis not present

## 2022-10-10 DIAGNOSIS — M545 Low back pain, unspecified: Secondary | ICD-10-CM | POA: Diagnosis not present

## 2022-10-18 DIAGNOSIS — N184 Chronic kidney disease, stage 4 (severe): Secondary | ICD-10-CM | POA: Diagnosis not present

## 2022-10-29 DIAGNOSIS — I129 Hypertensive chronic kidney disease with stage 1 through stage 4 chronic kidney disease, or unspecified chronic kidney disease: Secondary | ICD-10-CM | POA: Diagnosis not present

## 2022-10-29 DIAGNOSIS — N184 Chronic kidney disease, stage 4 (severe): Secondary | ICD-10-CM | POA: Diagnosis not present

## 2022-10-29 DIAGNOSIS — F17210 Nicotine dependence, cigarettes, uncomplicated: Secondary | ICD-10-CM | POA: Diagnosis not present

## 2022-10-29 DIAGNOSIS — Z7985 Long-term (current) use of injectable non-insulin antidiabetic drugs: Secondary | ICD-10-CM | POA: Diagnosis not present

## 2022-10-29 DIAGNOSIS — E1122 Type 2 diabetes mellitus with diabetic chronic kidney disease: Secondary | ICD-10-CM | POA: Diagnosis not present

## 2022-10-29 DIAGNOSIS — Z7984 Long term (current) use of oral hypoglycemic drugs: Secondary | ICD-10-CM | POA: Diagnosis not present

## 2022-11-09 DIAGNOSIS — G546 Phantom limb syndrome with pain: Secondary | ICD-10-CM | POA: Diagnosis not present

## 2022-11-09 DIAGNOSIS — Z79899 Other long term (current) drug therapy: Secondary | ICD-10-CM | POA: Diagnosis not present

## 2022-11-09 DIAGNOSIS — Z79891 Long term (current) use of opiate analgesic: Secondary | ICD-10-CM | POA: Diagnosis not present

## 2022-11-09 DIAGNOSIS — M545 Low back pain, unspecified: Secondary | ICD-10-CM | POA: Diagnosis not present

## 2022-11-09 DIAGNOSIS — R030 Elevated blood-pressure reading, without diagnosis of hypertension: Secondary | ICD-10-CM | POA: Diagnosis not present

## 2022-11-19 ENCOUNTER — Telehealth: Payer: Self-pay

## 2022-11-19 NOTE — Progress Notes (Signed)
[facility]) [place]   11/19/2022  CHAE OOMMEN [birth year] [id number]   Reason for referral: Medication Assistance with Trulicity  Referral source: [facility] RN - Tomasa Rand, RN, BSN, CEN [place] Coordinator Current insurance: [facility]  Reason for call: Follow-up regarding referral   Outreach:  Unsuccessful telephone call attempt #3 to patient.   HIPAA compliant voicemail left requesting a return call  Plan:  -I will close [place] case at this time as I have been unable to establish and/or maintain contact with patient. Will close referral as patient/caregiver (wife Danton Clap) has been either difficult to contact or completing tasks necessary for application. Alice declined this pharmacist from helping with LIS application and she was provided website, telephone number, local [facility] office information to complete LIS yet she has not. Additionally, office staff have not switched patient medication to [place] after multiple outreaches from this pharmacist nor did patient discuss therapeutic change with nephrologist as previously discussed. This pharmacist did call nephrologist and his response was noted in telephone encounter 09/04/2022. Therefore, patient/spouse was still uncomfortable switching medication since they did not discuss Ozempic with nephrologist. Of note, patient last filled Trulicity on 47/42.59 for 28 days supply. -I am happy to assist in the future as needed.  -Thank you for allowing [facility] pharmacy to be involved in this patient's care.   Kristeen Miss, PharmD Clinical Pharmacist [place] Cell: [phone]

## 2022-11-22 DIAGNOSIS — N184 Chronic kidney disease, stage 4 (severe): Secondary | ICD-10-CM | POA: Diagnosis not present

## 2022-11-22 DIAGNOSIS — E782 Mixed hyperlipidemia: Secondary | ICD-10-CM | POA: Diagnosis not present

## 2022-11-22 DIAGNOSIS — Z79899 Other long term (current) drug therapy: Secondary | ICD-10-CM | POA: Diagnosis not present

## 2022-11-22 DIAGNOSIS — J439 Emphysema, unspecified: Secondary | ICD-10-CM | POA: Diagnosis not present

## 2022-11-22 DIAGNOSIS — E1151 Type 2 diabetes mellitus with diabetic peripheral angiopathy without gangrene: Secondary | ICD-10-CM | POA: Diagnosis not present

## 2022-11-22 DIAGNOSIS — I10 Essential (primary) hypertension: Secondary | ICD-10-CM | POA: Diagnosis not present

## 2022-12-02 DIAGNOSIS — Z79899 Other long term (current) drug therapy: Secondary | ICD-10-CM | POA: Diagnosis not present

## 2022-12-02 DIAGNOSIS — G546 Phantom limb syndrome with pain: Secondary | ICD-10-CM | POA: Diagnosis not present

## 2022-12-02 DIAGNOSIS — M545 Low back pain, unspecified: Secondary | ICD-10-CM | POA: Diagnosis not present

## 2022-12-02 DIAGNOSIS — R030 Elevated blood-pressure reading, without diagnosis of hypertension: Secondary | ICD-10-CM | POA: Diagnosis not present

## 2022-12-02 DIAGNOSIS — Z79891 Long term (current) use of opiate analgesic: Secondary | ICD-10-CM | POA: Diagnosis not present

## 2022-12-29 DIAGNOSIS — N184 Chronic kidney disease, stage 4 (severe): Secondary | ICD-10-CM | POA: Diagnosis not present

## 2022-12-29 DIAGNOSIS — E782 Mixed hyperlipidemia: Secondary | ICD-10-CM | POA: Diagnosis not present

## 2023-01-07 DIAGNOSIS — M545 Low back pain, unspecified: Secondary | ICD-10-CM | POA: Diagnosis not present

## 2023-01-07 DIAGNOSIS — G546 Phantom limb syndrome with pain: Secondary | ICD-10-CM | POA: Diagnosis not present

## 2023-01-07 DIAGNOSIS — Z79899 Other long term (current) drug therapy: Secondary | ICD-10-CM | POA: Diagnosis not present

## 2023-01-07 DIAGNOSIS — Z79891 Long term (current) use of opiate analgesic: Secondary | ICD-10-CM | POA: Diagnosis not present

## 2023-01-07 DIAGNOSIS — R030 Elevated blood-pressure reading, without diagnosis of hypertension: Secondary | ICD-10-CM | POA: Diagnosis not present

## 2023-02-07 DIAGNOSIS — Z79891 Long term (current) use of opiate analgesic: Secondary | ICD-10-CM | POA: Diagnosis not present

## 2023-02-07 DIAGNOSIS — M545 Low back pain, unspecified: Secondary | ICD-10-CM | POA: Diagnosis not present

## 2023-02-07 DIAGNOSIS — Z79899 Other long term (current) drug therapy: Secondary | ICD-10-CM | POA: Diagnosis not present

## 2023-02-07 DIAGNOSIS — R030 Elevated blood-pressure reading, without diagnosis of hypertension: Secondary | ICD-10-CM | POA: Diagnosis not present

## 2023-02-07 DIAGNOSIS — G546 Phantom limb syndrome with pain: Secondary | ICD-10-CM | POA: Diagnosis not present

## 2023-02-26 DIAGNOSIS — E1149 Type 2 diabetes mellitus with other diabetic neurological complication: Secondary | ICD-10-CM | POA: Diagnosis not present

## 2023-02-26 DIAGNOSIS — E782 Mixed hyperlipidemia: Secondary | ICD-10-CM | POA: Diagnosis not present

## 2023-02-26 DIAGNOSIS — Z79899 Other long term (current) drug therapy: Secondary | ICD-10-CM | POA: Diagnosis not present

## 2023-05-07 DIAGNOSIS — M545 Low back pain, unspecified: Secondary | ICD-10-CM | POA: Diagnosis not present

## 2023-05-07 DIAGNOSIS — R030 Elevated blood-pressure reading, without diagnosis of hypertension: Secondary | ICD-10-CM | POA: Diagnosis not present

## 2023-05-07 DIAGNOSIS — G546 Phantom limb syndrome with pain: Secondary | ICD-10-CM | POA: Diagnosis not present

## 2023-06-06 DIAGNOSIS — R030 Elevated blood-pressure reading, without diagnosis of hypertension: Secondary | ICD-10-CM | POA: Diagnosis not present

## 2023-06-06 DIAGNOSIS — J309 Allergic rhinitis, unspecified: Secondary | ICD-10-CM | POA: Diagnosis not present

## 2023-06-06 DIAGNOSIS — M545 Low back pain, unspecified: Secondary | ICD-10-CM | POA: Diagnosis not present

## 2023-06-06 DIAGNOSIS — G546 Phantom limb syndrome with pain: Secondary | ICD-10-CM | POA: Diagnosis not present

## 2023-06-27 DIAGNOSIS — E782 Mixed hyperlipidemia: Secondary | ICD-10-CM | POA: Diagnosis not present

## 2023-06-27 DIAGNOSIS — Z79899 Other long term (current) drug therapy: Secondary | ICD-10-CM | POA: Diagnosis not present

## 2023-06-27 DIAGNOSIS — E1149 Type 2 diabetes mellitus with other diabetic neurological complication: Secondary | ICD-10-CM | POA: Diagnosis not present

## 2023-06-27 DIAGNOSIS — N184 Chronic kidney disease, stage 4 (severe): Secondary | ICD-10-CM | POA: Diagnosis not present

## 2023-07-07 DIAGNOSIS — G546 Phantom limb syndrome with pain: Secondary | ICD-10-CM | POA: Diagnosis not present

## 2023-07-07 DIAGNOSIS — M545 Low back pain, unspecified: Secondary | ICD-10-CM | POA: Diagnosis not present

## 2023-07-08 DIAGNOSIS — N184 Chronic kidney disease, stage 4 (severe): Secondary | ICD-10-CM | POA: Diagnosis not present

## 2023-07-08 DIAGNOSIS — E1122 Type 2 diabetes mellitus with diabetic chronic kidney disease: Secondary | ICD-10-CM | POA: Diagnosis not present

## 2023-07-08 DIAGNOSIS — R600 Localized edema: Secondary | ICD-10-CM | POA: Diagnosis not present

## 2023-07-08 DIAGNOSIS — I129 Hypertensive chronic kidney disease with stage 1 through stage 4 chronic kidney disease, or unspecified chronic kidney disease: Secondary | ICD-10-CM | POA: Diagnosis not present

## 2023-07-29 DIAGNOSIS — E782 Mixed hyperlipidemia: Secondary | ICD-10-CM | POA: Diagnosis not present

## 2023-07-29 DIAGNOSIS — J439 Emphysema, unspecified: Secondary | ICD-10-CM | POA: Diagnosis not present

## 2023-07-29 DIAGNOSIS — I10 Essential (primary) hypertension: Secondary | ICD-10-CM | POA: Diagnosis not present

## 2023-07-29 DIAGNOSIS — Z87891 Personal history of nicotine dependence: Secondary | ICD-10-CM | POA: Diagnosis not present

## 2023-07-29 DIAGNOSIS — I739 Peripheral vascular disease, unspecified: Secondary | ICD-10-CM | POA: Diagnosis not present

## 2023-07-29 DIAGNOSIS — I5030 Unspecified diastolic (congestive) heart failure: Secondary | ICD-10-CM | POA: Diagnosis not present

## 2023-07-29 DIAGNOSIS — E1151 Type 2 diabetes mellitus with diabetic peripheral angiopathy without gangrene: Secondary | ICD-10-CM | POA: Diagnosis not present

## 2023-07-29 DIAGNOSIS — N184 Chronic kidney disease, stage 4 (severe): Secondary | ICD-10-CM | POA: Diagnosis not present

## 2023-07-29 DIAGNOSIS — C4491 Basal cell carcinoma of skin, unspecified: Secondary | ICD-10-CM | POA: Diagnosis not present

## 2023-07-30 ENCOUNTER — Other Ambulatory Visit: Payer: Self-pay | Admitting: Family Medicine

## 2023-07-30 DIAGNOSIS — Z87891 Personal history of nicotine dependence: Secondary | ICD-10-CM

## 2023-08-06 DIAGNOSIS — M545 Low back pain, unspecified: Secondary | ICD-10-CM | POA: Diagnosis not present

## 2023-08-06 DIAGNOSIS — G546 Phantom limb syndrome with pain: Secondary | ICD-10-CM | POA: Diagnosis not present

## 2023-08-14 DIAGNOSIS — Z0000 Encounter for general adult medical examination without abnormal findings: Secondary | ICD-10-CM | POA: Diagnosis not present

## 2023-08-14 DIAGNOSIS — Z9181 History of falling: Secondary | ICD-10-CM | POA: Diagnosis not present

## 2023-09-06 DIAGNOSIS — Z79899 Other long term (current) drug therapy: Secondary | ICD-10-CM | POA: Diagnosis not present

## 2023-09-06 DIAGNOSIS — M545 Low back pain, unspecified: Secondary | ICD-10-CM | POA: Diagnosis not present

## 2023-09-06 DIAGNOSIS — G546 Phantom limb syndrome with pain: Secondary | ICD-10-CM | POA: Diagnosis not present

## 2023-09-11 DIAGNOSIS — L570 Actinic keratosis: Secondary | ICD-10-CM | POA: Diagnosis not present

## 2023-09-11 DIAGNOSIS — C44329 Squamous cell carcinoma of skin of other parts of face: Secondary | ICD-10-CM | POA: Diagnosis not present

## 2023-09-11 DIAGNOSIS — C4442 Squamous cell carcinoma of skin of scalp and neck: Secondary | ICD-10-CM | POA: Diagnosis not present

## 2023-09-11 DIAGNOSIS — D2239 Melanocytic nevi of other parts of face: Secondary | ICD-10-CM | POA: Diagnosis not present

## 2023-10-04 DIAGNOSIS — G546 Phantom limb syndrome with pain: Secondary | ICD-10-CM | POA: Diagnosis not present

## 2023-10-04 DIAGNOSIS — M545 Low back pain, unspecified: Secondary | ICD-10-CM | POA: Diagnosis not present

## 2023-10-16 DIAGNOSIS — C44329 Squamous cell carcinoma of skin of other parts of face: Secondary | ICD-10-CM | POA: Diagnosis not present

## 2023-11-06 DIAGNOSIS — G546 Phantom limb syndrome with pain: Secondary | ICD-10-CM | POA: Diagnosis not present

## 2023-11-06 DIAGNOSIS — M545 Low back pain, unspecified: Secondary | ICD-10-CM | POA: Diagnosis not present

## 2023-11-07 DIAGNOSIS — Z79899 Other long term (current) drug therapy: Secondary | ICD-10-CM | POA: Diagnosis not present

## 2023-11-07 DIAGNOSIS — N184 Chronic kidney disease, stage 4 (severe): Secondary | ICD-10-CM | POA: Diagnosis not present

## 2023-11-07 DIAGNOSIS — E782 Mixed hyperlipidemia: Secondary | ICD-10-CM | POA: Diagnosis not present

## 2023-11-07 DIAGNOSIS — E1149 Type 2 diabetes mellitus with other diabetic neurological complication: Secondary | ICD-10-CM | POA: Diagnosis not present

## 2023-11-11 DIAGNOSIS — I129 Hypertensive chronic kidney disease with stage 1 through stage 4 chronic kidney disease, or unspecified chronic kidney disease: Secondary | ICD-10-CM | POA: Diagnosis not present

## 2023-11-11 DIAGNOSIS — N184 Chronic kidney disease, stage 4 (severe): Secondary | ICD-10-CM | POA: Diagnosis not present

## 2023-11-11 DIAGNOSIS — D631 Anemia in chronic kidney disease: Secondary | ICD-10-CM | POA: Diagnosis not present

## 2023-11-11 DIAGNOSIS — E1122 Type 2 diabetes mellitus with diabetic chronic kidney disease: Secondary | ICD-10-CM | POA: Diagnosis not present

## 2023-12-03 DIAGNOSIS — G546 Phantom limb syndrome with pain: Secondary | ICD-10-CM | POA: Diagnosis not present

## 2023-12-03 DIAGNOSIS — M545 Low back pain, unspecified: Secondary | ICD-10-CM | POA: Diagnosis not present

## 2023-12-03 DIAGNOSIS — Z0000 Encounter for general adult medical examination without abnormal findings: Secondary | ICD-10-CM | POA: Diagnosis not present

## 2023-12-05 DIAGNOSIS — J439 Emphysema, unspecified: Secondary | ICD-10-CM | POA: Diagnosis not present

## 2023-12-05 DIAGNOSIS — E782 Mixed hyperlipidemia: Secondary | ICD-10-CM | POA: Diagnosis not present

## 2023-12-05 DIAGNOSIS — I739 Peripheral vascular disease, unspecified: Secondary | ICD-10-CM | POA: Diagnosis not present

## 2023-12-05 DIAGNOSIS — N184 Chronic kidney disease, stage 4 (severe): Secondary | ICD-10-CM | POA: Diagnosis not present

## 2023-12-05 DIAGNOSIS — I10 Essential (primary) hypertension: Secondary | ICD-10-CM | POA: Diagnosis not present

## 2023-12-05 DIAGNOSIS — R809 Proteinuria, unspecified: Secondary | ICD-10-CM | POA: Diagnosis not present

## 2023-12-05 DIAGNOSIS — I5030 Unspecified diastolic (congestive) heart failure: Secondary | ICD-10-CM | POA: Diagnosis not present

## 2023-12-05 DIAGNOSIS — E1129 Type 2 diabetes mellitus with other diabetic kidney complication: Secondary | ICD-10-CM | POA: Diagnosis not present

## 2023-12-05 DIAGNOSIS — D649 Anemia, unspecified: Secondary | ICD-10-CM | POA: Diagnosis not present

## 2023-12-05 DIAGNOSIS — Z23 Encounter for immunization: Secondary | ICD-10-CM | POA: Diagnosis not present

## 2024-01-02 DIAGNOSIS — M545 Low back pain, unspecified: Secondary | ICD-10-CM | POA: Diagnosis not present

## 2024-01-02 DIAGNOSIS — G546 Phantom limb syndrome with pain: Secondary | ICD-10-CM | POA: Diagnosis not present

## 2024-02-03 DIAGNOSIS — G546 Phantom limb syndrome with pain: Secondary | ICD-10-CM | POA: Diagnosis not present

## 2024-02-03 DIAGNOSIS — M545 Low back pain, unspecified: Secondary | ICD-10-CM | POA: Diagnosis not present

## 2024-03-02 DIAGNOSIS — M545 Low back pain, unspecified: Secondary | ICD-10-CM | POA: Diagnosis not present

## 2024-03-02 DIAGNOSIS — Z79899 Other long term (current) drug therapy: Secondary | ICD-10-CM | POA: Diagnosis not present

## 2024-03-02 DIAGNOSIS — G546 Phantom limb syndrome with pain: Secondary | ICD-10-CM | POA: Diagnosis not present

## 2024-03-04 DIAGNOSIS — E782 Mixed hyperlipidemia: Secondary | ICD-10-CM | POA: Diagnosis not present

## 2024-03-04 DIAGNOSIS — D509 Iron deficiency anemia, unspecified: Secondary | ICD-10-CM | POA: Diagnosis not present

## 2024-03-04 DIAGNOSIS — N184 Chronic kidney disease, stage 4 (severe): Secondary | ICD-10-CM | POA: Diagnosis not present

## 2024-03-04 DIAGNOSIS — Z79899 Other long term (current) drug therapy: Secondary | ICD-10-CM | POA: Diagnosis not present

## 2024-03-04 DIAGNOSIS — E1149 Type 2 diabetes mellitus with other diabetic neurological complication: Secondary | ICD-10-CM | POA: Diagnosis not present

## 2024-03-31 DIAGNOSIS — I129 Hypertensive chronic kidney disease with stage 1 through stage 4 chronic kidney disease, or unspecified chronic kidney disease: Secondary | ICD-10-CM | POA: Diagnosis not present

## 2024-03-31 DIAGNOSIS — E1122 Type 2 diabetes mellitus with diabetic chronic kidney disease: Secondary | ICD-10-CM | POA: Diagnosis not present

## 2024-03-31 DIAGNOSIS — R600 Localized edema: Secondary | ICD-10-CM | POA: Diagnosis not present

## 2024-03-31 DIAGNOSIS — N184 Chronic kidney disease, stage 4 (severe): Secondary | ICD-10-CM | POA: Diagnosis not present

## 2024-03-31 DIAGNOSIS — D631 Anemia in chronic kidney disease: Secondary | ICD-10-CM | POA: Diagnosis not present

## 2024-04-02 DIAGNOSIS — M545 Low back pain, unspecified: Secondary | ICD-10-CM | POA: Diagnosis not present

## 2024-04-02 DIAGNOSIS — G546 Phantom limb syndrome with pain: Secondary | ICD-10-CM | POA: Diagnosis not present

## 2024-04-14 DIAGNOSIS — Z89612 Acquired absence of left leg above knee: Secondary | ICD-10-CM | POA: Diagnosis not present

## 2024-04-14 DIAGNOSIS — E782 Mixed hyperlipidemia: Secondary | ICD-10-CM | POA: Diagnosis not present

## 2024-04-14 DIAGNOSIS — I70202 Unspecified atherosclerosis of native arteries of extremities, left leg: Secondary | ICD-10-CM | POA: Diagnosis not present

## 2024-04-14 DIAGNOSIS — I10 Essential (primary) hypertension: Secondary | ICD-10-CM | POA: Diagnosis not present

## 2024-04-14 DIAGNOSIS — I5030 Unspecified diastolic (congestive) heart failure: Secondary | ICD-10-CM | POA: Diagnosis not present

## 2024-04-14 DIAGNOSIS — E1129 Type 2 diabetes mellitus with other diabetic kidney complication: Secondary | ICD-10-CM | POA: Diagnosis not present

## 2024-04-14 DIAGNOSIS — J439 Emphysema, unspecified: Secondary | ICD-10-CM | POA: Diagnosis not present

## 2024-04-14 DIAGNOSIS — N184 Chronic kidney disease, stage 4 (severe): Secondary | ICD-10-CM | POA: Diagnosis not present

## 2024-04-14 DIAGNOSIS — I739 Peripheral vascular disease, unspecified: Secondary | ICD-10-CM | POA: Diagnosis not present

## 2024-04-14 DIAGNOSIS — R809 Proteinuria, unspecified: Secondary | ICD-10-CM | POA: Diagnosis not present

## 2024-04-30 DIAGNOSIS — Z136 Encounter for screening for cardiovascular disorders: Secondary | ICD-10-CM | POA: Diagnosis not present

## 2024-04-30 DIAGNOSIS — R0989 Other specified symptoms and signs involving the circulatory and respiratory systems: Secondary | ICD-10-CM | POA: Diagnosis not present

## 2024-04-30 DIAGNOSIS — Z1211 Encounter for screening for malignant neoplasm of colon: Secondary | ICD-10-CM | POA: Diagnosis not present

## 2024-04-30 DIAGNOSIS — M545 Low back pain, unspecified: Secondary | ICD-10-CM | POA: Diagnosis not present

## 2024-04-30 DIAGNOSIS — F17210 Nicotine dependence, cigarettes, uncomplicated: Secondary | ICD-10-CM | POA: Diagnosis not present

## 2024-04-30 DIAGNOSIS — E119 Type 2 diabetes mellitus without complications: Secondary | ICD-10-CM | POA: Diagnosis not present

## 2024-04-30 DIAGNOSIS — Z122 Encounter for screening for malignant neoplasm of respiratory organs: Secondary | ICD-10-CM | POA: Diagnosis not present

## 2024-04-30 DIAGNOSIS — Z0000 Encounter for general adult medical examination without abnormal findings: Secondary | ICD-10-CM | POA: Diagnosis not present

## 2024-04-30 DIAGNOSIS — G546 Phantom limb syndrome with pain: Secondary | ICD-10-CM | POA: Diagnosis not present

## 2024-04-30 DIAGNOSIS — J449 Chronic obstructive pulmonary disease, unspecified: Secondary | ICD-10-CM | POA: Diagnosis not present

## 2024-04-30 DIAGNOSIS — M79605 Pain in left leg: Secondary | ICD-10-CM | POA: Diagnosis not present

## 2024-04-30 DIAGNOSIS — M79604 Pain in right leg: Secondary | ICD-10-CM | POA: Diagnosis not present

## 2024-06-01 DIAGNOSIS — M545 Low back pain, unspecified: Secondary | ICD-10-CM | POA: Diagnosis not present

## 2024-06-01 DIAGNOSIS — R0989 Other specified symptoms and signs involving the circulatory and respiratory systems: Secondary | ICD-10-CM | POA: Diagnosis not present

## 2024-06-01 DIAGNOSIS — M79604 Pain in right leg: Secondary | ICD-10-CM | POA: Diagnosis not present

## 2024-06-01 DIAGNOSIS — M79605 Pain in left leg: Secondary | ICD-10-CM | POA: Diagnosis not present

## 2024-07-01 DIAGNOSIS — N184 Chronic kidney disease, stage 4 (severe): Secondary | ICD-10-CM | POA: Diagnosis not present

## 2024-07-01 DIAGNOSIS — M545 Low back pain, unspecified: Secondary | ICD-10-CM | POA: Diagnosis not present

## 2024-07-28 DIAGNOSIS — E119 Type 2 diabetes mellitus without complications: Secondary | ICD-10-CM | POA: Diagnosis not present

## 2024-07-28 DIAGNOSIS — J449 Chronic obstructive pulmonary disease, unspecified: Secondary | ICD-10-CM | POA: Diagnosis not present

## 2024-07-28 DIAGNOSIS — N184 Chronic kidney disease, stage 4 (severe): Secondary | ICD-10-CM | POA: Diagnosis not present

## 2024-07-28 DIAGNOSIS — G546 Phantom limb syndrome with pain: Secondary | ICD-10-CM | POA: Diagnosis not present

## 2024-07-28 DIAGNOSIS — M545 Low back pain, unspecified: Secondary | ICD-10-CM | POA: Diagnosis not present

## 2024-07-28 DIAGNOSIS — E1122 Type 2 diabetes mellitus with diabetic chronic kidney disease: Secondary | ICD-10-CM | POA: Diagnosis not present

## 2024-07-29 DIAGNOSIS — N184 Chronic kidney disease, stage 4 (severe): Secondary | ICD-10-CM | POA: Diagnosis not present

## 2024-07-29 DIAGNOSIS — E1149 Type 2 diabetes mellitus with other diabetic neurological complication: Secondary | ICD-10-CM | POA: Diagnosis not present

## 2024-07-29 DIAGNOSIS — Z79899 Other long term (current) drug therapy: Secondary | ICD-10-CM | POA: Diagnosis not present

## 2024-07-29 DIAGNOSIS — D509 Iron deficiency anemia, unspecified: Secondary | ICD-10-CM | POA: Diagnosis not present

## 2024-07-29 DIAGNOSIS — E782 Mixed hyperlipidemia: Secondary | ICD-10-CM | POA: Diagnosis not present

## 2024-08-02 DIAGNOSIS — N184 Chronic kidney disease, stage 4 (severe): Secondary | ICD-10-CM | POA: Diagnosis not present

## 2024-08-02 DIAGNOSIS — D631 Anemia in chronic kidney disease: Secondary | ICD-10-CM | POA: Diagnosis not present

## 2024-08-02 DIAGNOSIS — I129 Hypertensive chronic kidney disease with stage 1 through stage 4 chronic kidney disease, or unspecified chronic kidney disease: Secondary | ICD-10-CM | POA: Diagnosis not present

## 2024-08-02 DIAGNOSIS — E876 Hypokalemia: Secondary | ICD-10-CM | POA: Diagnosis not present

## 2024-08-02 DIAGNOSIS — E1122 Type 2 diabetes mellitus with diabetic chronic kidney disease: Secondary | ICD-10-CM | POA: Diagnosis not present

## 2024-08-18 ENCOUNTER — Other Ambulatory Visit: Payer: Self-pay | Admitting: Family Medicine

## 2024-08-18 DIAGNOSIS — Z9181 History of falling: Secondary | ICD-10-CM | POA: Diagnosis not present

## 2024-08-18 DIAGNOSIS — E1129 Type 2 diabetes mellitus with other diabetic kidney complication: Secondary | ICD-10-CM | POA: Diagnosis not present

## 2024-08-18 DIAGNOSIS — E782 Mixed hyperlipidemia: Secondary | ICD-10-CM | POA: Diagnosis not present

## 2024-08-18 DIAGNOSIS — N184 Chronic kidney disease, stage 4 (severe): Secondary | ICD-10-CM | POA: Diagnosis not present

## 2024-08-18 DIAGNOSIS — I10 Essential (primary) hypertension: Secondary | ICD-10-CM | POA: Diagnosis not present

## 2024-08-18 DIAGNOSIS — R809 Proteinuria, unspecified: Secondary | ICD-10-CM | POA: Diagnosis not present

## 2024-08-18 DIAGNOSIS — Z87891 Personal history of nicotine dependence: Secondary | ICD-10-CM

## 2024-08-18 DIAGNOSIS — Z23 Encounter for immunization: Secondary | ICD-10-CM | POA: Diagnosis not present

## 2024-08-18 DIAGNOSIS — J439 Emphysema, unspecified: Secondary | ICD-10-CM | POA: Diagnosis not present

## 2024-08-18 DIAGNOSIS — D509 Iron deficiency anemia, unspecified: Secondary | ICD-10-CM | POA: Diagnosis not present

## 2024-08-26 ENCOUNTER — Ambulatory Visit
Admission: RE | Admit: 2024-08-26 | Discharge: 2024-08-26 | Disposition: A | Discharge status: Home / Self Care | Source: Ambulatory Visit | Attending: Family Medicine | Admitting: Family Medicine

## 2024-08-26 DIAGNOSIS — Z87891 Personal history of nicotine dependence: Secondary | ICD-10-CM | POA: Diagnosis not present

## 2024-08-31 DIAGNOSIS — Z794 Long term (current) use of insulin: Secondary | ICD-10-CM | POA: Diagnosis not present

## 2024-08-31 DIAGNOSIS — D509 Iron deficiency anemia, unspecified: Secondary | ICD-10-CM | POA: Diagnosis not present

## 2024-08-31 DIAGNOSIS — J449 Chronic obstructive pulmonary disease, unspecified: Secondary | ICD-10-CM | POA: Diagnosis not present

## 2024-08-31 DIAGNOSIS — Z5320 Procedure and treatment not carried out because of patient's decision for unspecified reasons: Secondary | ICD-10-CM | POA: Diagnosis not present

## 2024-08-31 DIAGNOSIS — Z1159 Encounter for screening for other viral diseases: Secondary | ICD-10-CM | POA: Diagnosis not present

## 2024-08-31 DIAGNOSIS — E119 Type 2 diabetes mellitus without complications: Secondary | ICD-10-CM | POA: Diagnosis not present

## 2024-08-31 DIAGNOSIS — Z79899 Other long term (current) drug therapy: Secondary | ICD-10-CM | POA: Diagnosis not present

## 2024-11-15 ENCOUNTER — Encounter (HOSPITAL_COMMUNITY): Payer: Self-pay

## 2024-11-15 ENCOUNTER — Other Ambulatory Visit: Payer: Self-pay

## 2024-11-15 ENCOUNTER — Inpatient Hospital Stay (HOSPITAL_COMMUNITY)
Admission: EM | Admit: 2024-11-15 | Discharge: 2024-11-18 | DRG: 638 | Disposition: A | Discharge status: Skilled Nursing Facility | Attending: Internal Medicine | Admitting: Internal Medicine

## 2024-11-15 DIAGNOSIS — E1100 Type 2 diabetes mellitus with hyperosmolarity without nonketotic hyperglycemic-hyperosmolar coma (NKHHC): Secondary | ICD-10-CM | POA: Diagnosis not present

## 2024-11-15 DIAGNOSIS — Z89512 Acquired absence of left leg below knee: Secondary | ICD-10-CM

## 2024-11-15 DIAGNOSIS — R739 Hyperglycemia, unspecified: Secondary | ICD-10-CM | POA: Diagnosis present

## 2024-11-15 DIAGNOSIS — L89612 Pressure ulcer of right heel, stage 2: Secondary | ICD-10-CM | POA: Diagnosis present

## 2024-11-15 DIAGNOSIS — D631 Anemia in chronic kidney disease: Secondary | ICD-10-CM | POA: Diagnosis present

## 2024-11-15 DIAGNOSIS — Z634 Disappearance and death of family member: Secondary | ICD-10-CM

## 2024-11-15 DIAGNOSIS — Z79899 Other long term (current) drug therapy: Secondary | ICD-10-CM

## 2024-11-15 DIAGNOSIS — Z6837 Body mass index (BMI) 37.0-37.9, adult: Secondary | ICD-10-CM

## 2024-11-15 DIAGNOSIS — Z79891 Long term (current) use of opiate analgesic: Secondary | ICD-10-CM

## 2024-11-15 DIAGNOSIS — E1165 Type 2 diabetes mellitus with hyperglycemia: Secondary | ICD-10-CM | POA: Diagnosis present

## 2024-11-15 DIAGNOSIS — L89322 Pressure ulcer of left buttock, stage 2: Secondary | ICD-10-CM | POA: Diagnosis present

## 2024-11-15 DIAGNOSIS — E66812 Obesity, class 2: Secondary | ICD-10-CM | POA: Diagnosis present

## 2024-11-15 DIAGNOSIS — Z833 Family history of diabetes mellitus: Secondary | ICD-10-CM

## 2024-11-15 DIAGNOSIS — Z89612 Acquired absence of left leg above knee: Secondary | ICD-10-CM

## 2024-11-15 DIAGNOSIS — Z825 Family history of asthma and other chronic lower respiratory diseases: Secondary | ICD-10-CM

## 2024-11-15 DIAGNOSIS — E860 Dehydration: Secondary | ICD-10-CM | POA: Diagnosis present

## 2024-11-15 DIAGNOSIS — Z602 Problems related to living alone: Secondary | ICD-10-CM | POA: Diagnosis present

## 2024-11-15 DIAGNOSIS — E876 Hypokalemia: Secondary | ICD-10-CM | POA: Diagnosis present

## 2024-11-15 DIAGNOSIS — F17210 Nicotine dependence, cigarettes, uncomplicated: Secondary | ICD-10-CM | POA: Diagnosis present

## 2024-11-15 DIAGNOSIS — L03115 Cellulitis of right lower limb: Secondary | ICD-10-CM | POA: Diagnosis not present

## 2024-11-15 DIAGNOSIS — Z7984 Long term (current) use of oral hypoglycemic drugs: Secondary | ICD-10-CM

## 2024-11-15 DIAGNOSIS — E785 Hyperlipidemia, unspecified: Secondary | ICD-10-CM | POA: Diagnosis present

## 2024-11-15 DIAGNOSIS — D509 Iron deficiency anemia, unspecified: Secondary | ICD-10-CM | POA: Diagnosis present

## 2024-11-15 DIAGNOSIS — E119 Type 2 diabetes mellitus without complications: Secondary | ICD-10-CM

## 2024-11-15 DIAGNOSIS — Z820 Family history of epilepsy and other diseases of the nervous system: Secondary | ICD-10-CM

## 2024-11-15 DIAGNOSIS — I129 Hypertensive chronic kidney disease with stage 1 through stage 4 chronic kidney disease, or unspecified chronic kidney disease: Secondary | ICD-10-CM | POA: Diagnosis present

## 2024-11-15 DIAGNOSIS — N184 Chronic kidney disease, stage 4 (severe): Secondary | ICD-10-CM | POA: Diagnosis present

## 2024-11-15 DIAGNOSIS — R531 Weakness: Secondary | ICD-10-CM

## 2024-11-15 DIAGNOSIS — E440 Moderate protein-calorie malnutrition: Secondary | ICD-10-CM | POA: Diagnosis present

## 2024-11-15 DIAGNOSIS — E1122 Type 2 diabetes mellitus with diabetic chronic kidney disease: Secondary | ICD-10-CM | POA: Diagnosis present

## 2024-11-15 DIAGNOSIS — Z8679 Personal history of other diseases of the circulatory system: Secondary | ICD-10-CM

## 2024-11-15 DIAGNOSIS — E782 Mixed hyperlipidemia: Secondary | ICD-10-CM

## 2024-11-15 DIAGNOSIS — Z720 Tobacco use: Secondary | ICD-10-CM | POA: Diagnosis present

## 2024-11-15 DIAGNOSIS — T383X6A Underdosing of insulin and oral hypoglycemic [antidiabetic] drugs, initial encounter: Secondary | ICD-10-CM | POA: Diagnosis present

## 2024-11-15 DIAGNOSIS — E1151 Type 2 diabetes mellitus with diabetic peripheral angiopathy without gangrene: Secondary | ICD-10-CM | POA: Diagnosis present

## 2024-11-15 DIAGNOSIS — L89310 Pressure ulcer of right buttock, unstageable: Secondary | ICD-10-CM | POA: Diagnosis present

## 2024-11-15 LAB — CBC WITH DIFFERENTIAL/PLATELET
Abs Immature Granulocytes: 0.04 K/uL (ref 0.00–0.07)
Basophils Absolute: 0 K/uL (ref 0.0–0.1)
Basophils Relative: 0 %
Eosinophils Absolute: 0.1 K/uL (ref 0.0–0.5)
Eosinophils Relative: 1 %
HCT: 28.1 % — ABNORMAL LOW (ref 39.0–52.0)
Hemoglobin: 8.6 g/dL — ABNORMAL LOW (ref 13.0–17.0)
Immature Granulocytes: 1 %
Lymphocytes Relative: 10 %
Lymphs Abs: 0.7 K/uL (ref 0.7–4.0)
MCH: 28.7 pg (ref 26.0–34.0)
MCHC: 30.6 g/dL (ref 30.0–36.0)
MCV: 93.7 fL (ref 80.0–100.0)
Monocytes Absolute: 0.6 K/uL (ref 0.1–1.0)
Monocytes Relative: 8 %
Neutro Abs: 5.7 K/uL (ref 1.7–7.7)
Neutrophils Relative %: 80 %
Platelets: 230 K/uL (ref 150–400)
RBC: 3 MIL/uL — ABNORMAL LOW (ref 4.22–5.81)
RDW: 14.9 % (ref 11.5–15.5)
WBC: 7.1 K/uL (ref 4.0–10.5)
nRBC: 0 % (ref 0.0–0.2)

## 2024-11-15 LAB — URINALYSIS, ROUTINE W REFLEX MICROSCOPIC
Bilirubin Urine: NEGATIVE
Glucose, UA: >=500 mg/dL — AB
Ketones, ur: NEGATIVE mg/dL
Leukocytes,Ua: NEGATIVE
Nitrite: NEGATIVE
Protein, ur: 100 mg/dL — AB
Specific Gravity, Urine: 1.014 (ref 1.005–1.030)
pH: 7 (ref 5.0–8.0)

## 2024-11-15 LAB — BASIC METABOLIC PANEL WITH GFR
Anion gap: 18 — ABNORMAL HIGH (ref 5–15)
BUN: 53 mg/dL — ABNORMAL HIGH (ref 8–23)
CO2: 26 mmol/L (ref 22–32)
Calcium: 9 mg/dL (ref 8.9–10.3)
Chloride: 84 mmol/L — ABNORMAL LOW (ref 98–111)
Creatinine, Ser: 3.61 mg/dL — ABNORMAL HIGH (ref 0.61–1.24)
GFR, Estimated: 17 mL/min — ABNORMAL LOW (ref >60)
Glucose, Bld: 655 mg/dL (ref 70–99)
Potassium: 3.2 mmol/L — ABNORMAL LOW (ref 3.5–5.1)
Sodium: 128 mmol/L — ABNORMAL LOW (ref 135–145)

## 2024-11-15 LAB — CBG MONITORING, ED
Glucose-Capillary: 429 mg/dL — ABNORMAL HIGH (ref 70–99)
Glucose-Capillary: 502 mg/dL (ref 70–99)
Glucose-Capillary: 573 mg/dL (ref 70–99)
Glucose-Capillary: 579 mg/dL (ref 70–99)

## 2024-11-15 LAB — HEMOGLOBIN A1C
Hgb A1c MFr Bld: 14.7 % — ABNORMAL HIGH (ref 4.8–5.6)
Mean Plasma Glucose: 375.19 mg/dL

## 2024-11-15 LAB — OSMOLALITY: Osmolality: 326 mosm/kg (ref 275–295)

## 2024-11-15 MED ORDER — LACTATED RINGERS IV BOLUS
1000.0000 mL | Freq: Once | INTRAVENOUS | Status: AC
  Administered 2024-11-15: 1000 mL via INTRAVENOUS

## 2024-11-15 MED ORDER — INSULIN REGULAR(HUMAN) IN NACL 100-0.9 UT/100ML-% IV SOLN
INTRAVENOUS | Status: DC
  Administered 2024-11-15: 8 [IU]/h via INTRAVENOUS
  Filled 2024-11-15: qty 100

## 2024-11-15 MED ORDER — DEXTROSE IN LACTATED RINGERS 5 % IV SOLN: INTRAVENOUS | Status: AC

## 2024-11-15 MED ORDER — POTASSIUM CHLORIDE CRYS ER 20 MEQ PO TBCR
40.0000 meq | EXTENDED_RELEASE_TABLET | Freq: Once | ORAL | Status: AC
  Administered 2024-11-15: 40 meq via ORAL
  Filled 2024-11-15: qty 2

## 2024-11-15 MED ORDER — ACETAMINOPHEN 325 MG PO TABS
650.0000 mg | ORAL_TABLET | Freq: Four times a day (QID) | ORAL | Status: DC | PRN
  Administered 2024-11-16: 650 mg via ORAL
  Filled 2024-11-15: qty 2

## 2024-11-15 MED ORDER — LACTATED RINGERS IV SOLN: INTRAVENOUS | Status: AC

## 2024-11-15 MED ORDER — ACETAMINOPHEN 650 MG RE SUPP: 650.0000 mg | Freq: Four times a day (QID) | RECTAL | Status: DC | PRN

## 2024-11-15 MED ORDER — ONDANSETRON HCL 4 MG/2ML IJ SOLN: 4.0000 mg | Freq: Four times a day (QID) | INTRAMUSCULAR | Status: DC | PRN

## 2024-11-15 MED ORDER — POTASSIUM CHLORIDE 10 MEQ/100ML IV SOLN
10.0000 meq | INTRAVENOUS | Status: AC
  Administered 2024-11-15 – 2024-11-16 (×4): 10 meq via INTRAVENOUS
  Filled 2024-11-15 (×4): qty 100

## 2024-11-15 MED ORDER — MELATONIN 3 MG PO TABS
3.0000 mg | ORAL_TABLET | Freq: Every evening | ORAL | Status: DC | PRN
  Administered 2024-11-16 – 2024-11-17 (×2): 3 mg via ORAL
  Filled 2024-11-15 (×2): qty 1

## 2024-11-15 NOTE — ED Notes (Signed)
 CCMD called for cardiac monitoring.

## 2024-11-15 NOTE — ED Notes (Signed)
 Pt K+ 3.2 MD advised. Will administer K+ and insulin  at the same time per provider.

## 2024-11-15 NOTE — H&P (Incomplete)
 History and Physical      Erik Martin:[id number] DOB: 1951/09/30 DOA: 11/15/2024; DOS: 11/15/2024  PCP: Erik Macario BRAVO, NP (Inactive) *** Patient coming from: home ***  I have personally briefly reviewed patient's old medical records in [facility]  Chief Complaint: ***  HPI: Erik Martin is a 73 y.o. male with medical history significant for *** who is admitted to [facility] on 11/15/2024 with *** after presenting from home*** to [facility] ED complaining of ***.    ***       ***   ED Course:  Vital signs in the ED were notable for the following: ***  Labs were notable for the following: ***  Per my interpretation, EKG in ED demonstrated the following:  ***  Imaging in the ED, per corresponding formal radiology read, was notable for the following:  ***  While in the ED, the following were administered: ***  Subsequently, the patient was admitted  ***  ***red    Review of Systems: As per HPI otherwise 10 point review of systems negative.   Past Medical History:  Diagnosis Date   Arthritis    B12 deficiency    Cervical radiculopathy    Depression    Diabetes mellitus without complication (HCC)    GERD (gastroesophageal reflux disease)    Hyperlipidemia    Hypertension    Hypomagnesemia    Lumbar pain     Past Surgical History:  Procedure Laterality Date   BACK SURGERY     FUSION   ELBOW SURGERY Left    AS CHILD   FRACTURE SURGERY Left    left knee surgery Left    SHOULDER SURGERY Left    ROTOROR CUFF,BONE SPUR    Social History:  reports that he has been smoking cigarettes. He has a 40 pack-year smoking history. He has never used smokeless tobacco. He reports current alcohol use of about 3.0 standard drinks of alcohol per week. He reports that he does not use drugs.   Allergies  Allergen Reactions   Nsaids Other (See Comments)    H/O renal failure.   Tolmetin     Other reaction(s): Other (See Comments) H/O renal failure.    Glimepiride      Other reaction(s): Other (See Comments)   Niaspan [Niacin] Other (See Comments)    Flushing, burning    Family History  Problem Relation Age of Onset   Diabetes Mellitus II Sister    Diabetes Mellitus II Brother    Alzheimer's disease Mother    Asthma Mother     Family history reviewed and not pertinent ***   Prior to Admission medications   Medication Sig Start Date End Date Taking? Authorizing Provider  amLODipine  (NORVASC ) 10 MG tablet  07/07/17   [provider]  aspirin  EC 81 MG tablet Take 81 mg by mouth.    [provider]  ceFAZolin  (ANCEF ) IVPB Inject into the vein every 8 (eight) hours.    [provider]  Coenzyme Q10 (COQ10) 200 MG CAPS Take 200 mg by mouth.    [provider]  enoxaparin  (LOVENOX ) 30 MG/0.3ML injection  07/31/17   [provider]  ferrous sulfate  325 (65 FE) MG tablet Take 325 mg by mouth.    [provider]  Flaxseed, Linseed, (FLAX SEED OIL PO) Take 1,200 capsules by mouth 3 (three) times daily.     [provider]  hydrocortisone cream 1 % Apply 1 application topically 2 (two) times daily.  [provider]  MAGNESIUM  OXIDE PO Take 500 mg by mouth daily.     [provider]  Melatonin 5 MG TABS Take 5 tablets by mouth.    [provider]  metoprolol  tartrate (LOPRESSOR ) 25 MG tablet Take 25 mg by mouth daily. Taking 1/2 tablet, 12.5 mg daily    [provider]  ondansetron  (ZOFRAN ) 4 MG tablet Take 4 mg by mouth every 8 (eight) hours as needed for nausea or vomiting. Every 6 to 8 hours prn    [provider]  oxyCODONE  (OXYCONTIN ) 10 mg 12 hr tablet every 12 (twelve) hours.  08/02/17   [provider]  Oxycodone  HCl 10 MG TABS Take 10 mg by mouth every 6 (six) hours as needed. Breakthrough pain    [provider]  pantoprazole  (PROTONIX ) 40 MG tablet Take 40 mg by mouth daily as needed (acid reflux).    [provider]  pioglitazone  (ACTOS ) 45 MG tablet Take 45 mg by mouth daily.     [provider]  rosuvastatin (CRESTOR) 20 MG tablet daily.  07/15/17   [provider]  sennosides-docusate sodium  (SENOKOT-S) 8.6-50 MG tablet Take 2 tablets by mouth daily.    [provider]  vitamin B-12 (CYANOCOBALAMIN ) 1000 MCG tablet Take 2,000 mcg by mouth daily.    [provider]  Zinc  Sulfate (ZINC -220 PO) Take 220 tablets by mouth daily.    [provider]     Objective    Physical Exam: Vitals:   11/15/24 1947 11/15/24 1954  BP: (!) 120/42   Pulse:  (!) 116  Resp: 14   Temp: 97.8 F (36.6 C)   TempSrc: Oral   SpO2: 96% 97%    General: appears to be stated age; alert, oriented Skin: warm, dry, no rash Head:  AT/[place] Mouth:  Oral mucosa membranes appear moist, normal dentition Neck: supple; trachea midline Heart:  RRR; did not appreciate any M/R/G Lungs: CTAB, did not appreciate any wheezes, rales, or rhonchi Abdomen: + BS; soft, ND, NT Vascular: 2+ pedal pulses b/l; 2+ radial pulses b/l Extremities: no peripheral edema, no muscle wasting   ***   *** Neuro: strength and sensation intact in upper and lower extremities b/l  *** Neuro: 5/5 strength of the proximal and distal flexors and extensors of the upper and lower extremities bilaterally; sensation intact in upper and lower extremities b/l; cranial nerves II through XII grossly intact; no pronator drift; no evidence suggestive of slurred speech, dysarthria, or facial droop; Normal muscle tone. No tremors. *** Neuro: In the setting of the patient's current mental status and associated inability to follow instructions, unable to perform full neurologic exam at this time.  As such, assessment of strength, sensation, and cranial nerves is limited at this time. Patient noted to spontaneously move all 4 extremities. No tremors.  ***        Labs on Admission: I have personally reviewed  following labs and imaging studies  CBC: Recent Labs  Lab 11/15/24 2036  WBC 7.1  NEUTROABS 5.7  HGB 8.6*  HCT 28.1*  MCV 93.7  PLT 230   Basic Metabolic Panel: Recent Labs  Lab 11/15/24 2036  NA 128*  K 3.2*  CL 84*  CO2 26  GLUCOSE 655*  BUN 53*  CREATININE 3.61*  CALCIUM  9.0   GFR: CrCl cannot be calculated (Unknown ideal weight.). Liver Function Tests: No results for input(s): AST, ALT, ALKPHOS, BILITOT, PROT, ALBUMIN  in the last 168 hours. No  results for input(s): LIPASE, AMYLASE in the last 168 hours. No results for input(s): AMMONIA in the last 168 hours. Coagulation Profile: No results for input(s): INR, PROTIME in the last 168 hours. Cardiac Enzymes: No results for input(s): CKTOTAL, CKMB, CKMBINDEX, TROPONINI in the last 168 hours. BNP (last 3 results) No results for input(s): PROBNP in the last 8760 hours. HbA1C: No results for input(s): HGBA1C in the last 72 hours. CBG: Recent Labs  Lab 11/15/24 1956 11/15/24 2150 11/15/24 2250  GLUCAP 579* 573* 502*   Lipid Profile: No results for input(s): CHOL, HDL, LDLCALC, TRIG, CHOLHDL, LDLDIRECT in the last 72 hours. Thyroid Function Tests: No results for input(s): TSH, T4TOTAL, FREET4, T3FREE, THYROIDAB in the last 72 hours. Anemia Panel: No results for input(s): VITAMINB12, FOLATE, FERRITIN, TIBC, IRON, RETICCTPCT in the last 72 hours. Urine analysis:    Component Value Date/Time   COLORURINE STRAW (A) 11/15/2024 2000   APPEARANCEUR CLEAR 11/15/2024 2000   LABSPEC 1.014 11/15/2024 2000   PHURINE 7.0 11/15/2024 2000   GLUCOSEU >=500 (A) 11/15/2024 2000   HGBUR SMALL (A) 11/15/2024 2000   BILIRUBINUR NEGATIVE 11/15/2024 2000   KETONESUR NEGATIVE 11/15/2024 2000   PROTEINUR 100 (A) 11/15/2024 2000   UROBILINOGEN 1.0 11/01/2014 2010   NITRITE NEGATIVE 11/15/2024 2000   LEUKOCYTESUR NEGATIVE 11/15/2024 2000    Radiological Exams  on Admission: No results found.    Assessment/Plan   Principal Problem:   Hyperglycemia   ***            ***                     ***                      ***                     ***                     ***                     ***                      ***                     ***                     ***                     ***                     ***                    ***                   ***  DVT prophylaxis: SCD's ***  Code Status: Full code*** Family Communication: none*** Disposition Plan: Per Rounding Team Consults called: none***;  Admission status: ***     I SPENT GREATER THAN 75 *** MINUTES IN CLINICAL CARE TIME/MEDICAL DECISION-MAKING IN COMPLETING THIS ADMISSION.      Eva NOVAK Chianna Spirito DO [facility]  From 7PM - 7AM   11/15/2024, 11:31 PM   ***

## 2024-11-15 NOTE — ED Triage Notes (Signed)
 Pt bibrcems from [employer] w/ c/o weakness and recent falls. Ems stated patient hyperglycemic, glucometer reading high. Bp 140/70  Hr 100 92% ra 100% 2l

## 2024-11-15 NOTE — ED Provider Notes (Addendum)
 [place] EMERGENCY DEPARTMENT AT [facility] Provider Note   CSN: [id number] Arrival date & time: 11/15/24  1946     Patient presents with: Weakness and Hyperglycemia   Erik Martin is a 73 y.o. male.  With a history of poorly controlled type 2 diabetes status post left AKA who presents to the ED for hyperglycemia.  Repeated readings of high on glucometer at home over the last several days.  Significantly weak and dehydrated.  No chest pain shortness of breath fevers chills nausea vomiting.  Sadly, patient's wife whom he lives with passed 2 weeks ago and he feels as though he is going downhill ever since then and having difficulty caring for himself.    Weakness Hyperglycemia Associated symptoms: weakness        Prior to Admission medications   Medication Sig Start Date End Date Taking? Authorizing Provider  amLODipine  (NORVASC ) 10 MG tablet  07/07/17   [provider]  aspirin  EC 81 MG tablet Take 81 mg by mouth.    [provider]  ceFAZolin  (ANCEF ) IVPB Inject into the vein every 8 (eight) hours.    [provider]  Coenzyme Q10 (COQ10) 200 MG CAPS Take 200 mg by mouth.    [provider]  enoxaparin  (LOVENOX ) 30 MG/0.3ML injection  07/31/17   [provider]  ferrous sulfate  325 (65 FE) MG tablet Take 325 mg by mouth.    [provider]  Flaxseed, Linseed, (FLAX SEED OIL PO) Take 1,200 capsules by mouth 3 (three) times daily.     [provider]  hydrocortisone cream 1 % Apply 1 application topically 2 (two) times daily.    [provider]  MAGNESIUM  OXIDE PO Take 500 mg by mouth daily.     [provider]  Melatonin 5 MG TABS Take 5 tablets by mouth.    [provider]  metoprolol  tartrate (LOPRESSOR ) 25 MG tablet Take 25 mg by mouth daily. Taking 1/2 tablet, 12.5 mg daily    [provider]  ondansetron  (ZOFRAN ) 4 MG tablet Take 4 mg by mouth every 8 (eight) hours  as needed for nausea or vomiting. Every 6 to 8 hours prn    [provider]  oxyCODONE  (OXYCONTIN ) 10 mg 12 hr tablet every 12 (twelve) hours.  08/02/17   [provider]  Oxycodone  HCl 10 MG TABS Take 10 mg by mouth every 6 (six) hours as needed. Breakthrough pain    [provider]  pantoprazole  (PROTONIX ) 40 MG tablet Take 40 mg by mouth daily as needed (acid reflux).    [provider]  pioglitazone  (ACTOS ) 45 MG tablet Take 45 mg by mouth daily.     [provider]  rosuvastatin (CRESTOR) 20 MG tablet daily.  07/15/17   [provider]  sennosides-docusate sodium  (SENOKOT-S) 8.6-50 MG tablet Take 2 tablets by mouth daily.    [provider]  vitamin B-12 (CYANOCOBALAMIN ) 1000 MCG tablet Take 2,000 mcg by mouth daily.    [provider]  Zinc  Sulfate (ZINC -220 PO) Take 220 tablets by mouth daily.    [provider]    Allergies: Nsaids, Tolmetin, Glimepiride , and Niaspan [niacin]    Review of Systems  Neurological:  Positive for weakness.    Updated Vital Signs BP (!) 120/42 (BP Location: Right Wrist)   Pulse (!) 116   Temp 97.8 F (36.6 C) (Oral)   Resp 14   SpO2 97%   Physical Exam Vitals and  nursing note reviewed.  HENT:     Head: Normocephalic and atraumatic.  Eyes:     Pupils: Pupils are equal, round, and reactive to light.  Cardiovascular:     Rate and Rhythm: Normal rate and regular rhythm.  Pulmonary:     Effort: Pulmonary effort is normal.     Breath sounds: Normal breath sounds.  Abdominal:     Palpations: Abdomen is soft.     Tenderness: There is no abdominal tenderness.  Musculoskeletal:     Comments: Status post left AKA Chronic venous stasis changes of right lower extremity with palpable DP pulse on the right foot  Skin:    General: Skin is warm and dry.  Neurological:     Mental Status: He is alert.  Psychiatric:        Mood and Affect: Mood normal.     (all labs ordered  are listed, but only abnormal results are displayed) Labs Reviewed  BASIC METABOLIC PANEL WITH GFR - Abnormal; Notable for the following components:      Result Value   Sodium 128 (*)    Potassium 3.2 (*)    Chloride 84 (*)    Glucose, Bld 655 (*)    BUN 53 (*)    Creatinine, Ser 3.61 (*)    GFR, Estimated 17 (*)    Anion gap 18 (*)    All other components within normal limits  OSMOLALITY - Abnormal; Notable for the following components:   Osmolality 326 (*)    All other components within normal limits  CBC WITH DIFFERENTIAL/PLATELET - Abnormal; Notable for the following components:   RBC 3.00 (*)    Hemoglobin 8.6 (*)    HCT 28.1 (*)    All other components within normal limits  URINALYSIS, ROUTINE W REFLEX MICROSCOPIC - Abnormal; Notable for the following components:   Color, Urine STRAW (*)    Glucose, UA >=500 (*)    Hgb urine dipstick SMALL (*)    Protein, ur 100 (*)    Bacteria, UA RARE (*)    All other components within normal limits  CBG MONITORING, ED - Abnormal; Notable for the following components:   Glucose-Capillary 579 (*)    All other components within normal limits  CBG MONITORING, ED - Abnormal; Notable for the following components:   Glucose-Capillary 573 (*)    All other components within normal limits  CBG MONITORING, ED - Abnormal; Notable for the following components:   Glucose-Capillary 502 (*)    All other components within normal limits  BASIC METABOLIC PANEL WITH GFR  BASIC METABOLIC PANEL WITH GFR    EKG: EKG Interpretation Date/Time:  Monday November 15 2024 20:06:20 EST Ventricular Rate:  106 PR Interval:  208 QRS Duration:  154 QT Interval:  382 QTC Calculation: 508 R Axis:   202  Text Interpretation: Sinus tachycardia Borderline prolonged PR interval RBBB and LPFB Probable inferior infarct, recent Anterolateral infarct, age indeterminate Confirmed by Pamella Sharper [phone]) on 11/15/2024 9:39:12 PM  Radiology: No results  found.   .Critical Care  Performed by: Pamella Sharper LABOR, DO Authorized by: Pamella Sharper LABOR, DO   Critical care provider statement:    Critical care time (minutes):  40   Critical care was necessary to treat or prevent imminent or life-threatening deterioration of the following conditions:  Endocrine crisis   Critical care was time spent personally by me on the following activities:  Development of treatment plan with patient or surrogate, discussions with consultants, evaluation  of patient's response to treatment, examination of patient, ordering and review of laboratory studies, ordering and review of radiographic studies, ordering and performing treatments and interventions, pulse oximetry, re-evaluation of patient's condition, review of old charts and obtaining history from patient or surrogate   I assumed direction of critical care for this patient from another provider in my specialty: no     Care discussed with: admitting provider      Medications Ordered in the ED  insulin  regular, human (MYXREDLIN) 100 units/ 100 mL infusion (8 Units/hr Intravenous New Bag/Given 11/15/24 2218)  potassium chloride  10 mEq in 100 mL IVPB (10 mEq Intravenous New Bag/Given 11/15/24 2215)  lactated ringers  bolus 1,000 mL (0 mLs Intravenous Stopped 11/15/24 2219)  lactated ringers  bolus 1,000 mL (1,000 mLs Intravenous New Bag/Given 11/15/24 2216)  potassium chloride  SA (KLOR-CON  M) CR tablet 40 mEq (40 mEq Oral Given 11/15/24 2255)    Clinical Course as of 11/15/24 2312  Mon Nov 15, 2024  2306 Replating potassium.  Glucose downtrending.  Patient remained stable.  Will admit to medicine. [MP]  2311 Discussed with admitting hospitalist who accepts patient for admission [MP]    Clinical Course User Index [MP] Pamella Ozell LABOR, DO                                 Medical Decision Making 73 year old male with history as above presenting for hyperglycemia.  Type II without his poorly controlled.   Generalized weakness and tachycardia.  Initial blood glucose here and in the 500s.  Will initiate IV fluids then insulin  therapy and obtain laboratory workup.  Higher suspicion for HHS with DKA less likely.  Admission likely.  Amount and/or Complexity of Data Reviewed Labs: ordered.  Risk Prescription drug management.        Final diagnoses:  Hyperosmolar hyperglycemic state (HHS) (HCC)  Poorly controlled diabetes mellitus [facility])  Bereavement    ED Discharge Orders     None          Pamella Ozell LABOR, DO 11/15/24 2309    Pamella Ozell LABOR, DO 11/15/24 2312

## 2024-11-16 ENCOUNTER — Observation Stay (HOSPITAL_COMMUNITY)

## 2024-11-16 DIAGNOSIS — Z634 Disappearance and death of family member: Secondary | ICD-10-CM | POA: Diagnosis present

## 2024-11-16 DIAGNOSIS — E1100 Type 2 diabetes mellitus with hyperosmolarity without nonketotic hyperglycemic-hyperosmolar coma (NKHHC): Secondary | ICD-10-CM | POA: Diagnosis present

## 2024-11-16 DIAGNOSIS — Z7984 Long term (current) use of oral hypoglycemic drugs: Secondary | ICD-10-CM | POA: Diagnosis not present

## 2024-11-16 DIAGNOSIS — Z89512 Acquired absence of left leg below knee: Secondary | ICD-10-CM | POA: Diagnosis not present

## 2024-11-16 DIAGNOSIS — Z8679 Personal history of other diseases of the circulatory system: Secondary | ICD-10-CM

## 2024-11-16 DIAGNOSIS — E66812 Obesity, class 2: Secondary | ICD-10-CM | POA: Diagnosis present

## 2024-11-16 DIAGNOSIS — Z833 Family history of diabetes mellitus: Secondary | ICD-10-CM | POA: Diagnosis not present

## 2024-11-16 DIAGNOSIS — Z720 Tobacco use: Secondary | ICD-10-CM | POA: Diagnosis present

## 2024-11-16 DIAGNOSIS — I129 Hypertensive chronic kidney disease with stage 1 through stage 4 chronic kidney disease, or unspecified chronic kidney disease: Secondary | ICD-10-CM | POA: Diagnosis present

## 2024-11-16 DIAGNOSIS — Z79899 Other long term (current) drug therapy: Secondary | ICD-10-CM | POA: Diagnosis not present

## 2024-11-16 DIAGNOSIS — D631 Anemia in chronic kidney disease: Secondary | ICD-10-CM | POA: Diagnosis present

## 2024-11-16 DIAGNOSIS — E1151 Type 2 diabetes mellitus with diabetic peripheral angiopathy without gangrene: Secondary | ICD-10-CM | POA: Diagnosis present

## 2024-11-16 DIAGNOSIS — Z89612 Acquired absence of left leg above knee: Secondary | ICD-10-CM | POA: Diagnosis not present

## 2024-11-16 DIAGNOSIS — Z6837 Body mass index (BMI) 37.0-37.9, adult: Secondary | ICD-10-CM | POA: Diagnosis not present

## 2024-11-16 DIAGNOSIS — L89310 Pressure ulcer of right buttock, unstageable: Secondary | ICD-10-CM | POA: Diagnosis present

## 2024-11-16 DIAGNOSIS — F17210 Nicotine dependence, cigarettes, uncomplicated: Secondary | ICD-10-CM | POA: Diagnosis present

## 2024-11-16 DIAGNOSIS — E1122 Type 2 diabetes mellitus with diabetic chronic kidney disease: Secondary | ICD-10-CM | POA: Diagnosis present

## 2024-11-16 DIAGNOSIS — E440 Moderate protein-calorie malnutrition: Secondary | ICD-10-CM | POA: Diagnosis present

## 2024-11-16 DIAGNOSIS — E860 Dehydration: Secondary | ICD-10-CM | POA: Diagnosis present

## 2024-11-16 DIAGNOSIS — E876 Hypokalemia: Secondary | ICD-10-CM | POA: Diagnosis present

## 2024-11-16 DIAGNOSIS — L03115 Cellulitis of right lower limb: Secondary | ICD-10-CM | POA: Diagnosis present

## 2024-11-16 DIAGNOSIS — E1165 Type 2 diabetes mellitus with hyperglycemia: Secondary | ICD-10-CM | POA: Diagnosis present

## 2024-11-16 DIAGNOSIS — R531 Weakness: Secondary | ICD-10-CM

## 2024-11-16 DIAGNOSIS — N184 Chronic kidney disease, stage 4 (severe): Secondary | ICD-10-CM | POA: Diagnosis present

## 2024-11-16 DIAGNOSIS — L89322 Pressure ulcer of left buttock, stage 2: Secondary | ICD-10-CM | POA: Diagnosis present

## 2024-11-16 DIAGNOSIS — E785 Hyperlipidemia, unspecified: Secondary | ICD-10-CM | POA: Diagnosis present

## 2024-11-16 DIAGNOSIS — L89612 Pressure ulcer of right heel, stage 2: Secondary | ICD-10-CM | POA: Diagnosis present

## 2024-11-16 LAB — COMPREHENSIVE METABOLIC PANEL WITH GFR
ALT: 14 U/L (ref 0–44)
AST: 19 U/L (ref 15–41)
Albumin: 2.5 g/dL — ABNORMAL LOW (ref 3.5–5.0)
Alkaline Phosphatase: 44 U/L (ref 38–126)
Anion gap: 13 (ref 5–15)
BUN: 45 mg/dL — ABNORMAL HIGH (ref 8–23)
CO2: 29 mmol/L (ref 22–32)
Calcium: 9.1 mg/dL (ref 8.9–10.3)
Chloride: 90 mmol/L — ABNORMAL LOW (ref 98–111)
Creatinine, Ser: 3.26 mg/dL — ABNORMAL HIGH (ref 0.61–1.24)
GFR, Estimated: 19 mL/min — ABNORMAL LOW (ref >60)
Glucose, Bld: 260 mg/dL — ABNORMAL HIGH (ref 70–99)
Potassium: 3.2 mmol/L — ABNORMAL LOW (ref 3.5–5.1)
Sodium: 132 mmol/L — ABNORMAL LOW (ref 135–145)
Total Bilirubin: 0.5 mg/dL (ref 0.0–1.2)
Total Protein: 5.5 g/dL — ABNORMAL LOW (ref 6.5–8.1)

## 2024-11-16 LAB — IRON AND TIBC
Iron: 17 ug/dL — ABNORMAL LOW (ref 45–182)
Saturation Ratios: 6 % — ABNORMAL LOW (ref 17.9–39.5)
TIBC: 281 ug/dL (ref 250–450)
UIBC: 264 ug/dL

## 2024-11-16 LAB — BASIC METABOLIC PANEL WITH GFR
Anion gap: 15 (ref 5–15)
Anion gap: 11 (ref 5–15)
Anion gap: 12 (ref 5–15)
Anion gap: 15 (ref 5–15)
BUN: 47 mg/dL — ABNORMAL HIGH (ref 8–23)
BUN: 39 mg/dL — ABNORMAL HIGH (ref 8–23)
BUN: 41 mg/dL — ABNORMAL HIGH (ref 8–23)
BUN: 43 mg/dL — ABNORMAL HIGH (ref 8–23)
CO2: 29 mmol/L (ref 22–32)
CO2: 29 mmol/L (ref 22–32)
CO2: 30 mmol/L (ref 22–32)
CO2: 25 mmol/L (ref 22–32)
Calcium: 9.3 mg/dL (ref 8.9–10.3)
Calcium: 8.4 mg/dL — ABNORMAL LOW (ref 8.9–10.3)
Calcium: 8.7 mg/dL — ABNORMAL LOW (ref 8.9–10.3)
Calcium: 8.9 mg/dL (ref 8.9–10.3)
Chloride: 90 mmol/L — ABNORMAL LOW (ref 98–111)
Chloride: 95 mmol/L — ABNORMAL LOW (ref 98–111)
Chloride: 94 mmol/L — ABNORMAL LOW (ref 98–111)
Chloride: 91 mmol/L — ABNORMAL LOW (ref 98–111)
Creatinine, Ser: 3.32 mg/dL — ABNORMAL HIGH (ref 0.61–1.24)
Creatinine, Ser: 3.02 mg/dL — ABNORMAL HIGH (ref 0.61–1.24)
Creatinine, Ser: 3.16 mg/dL — ABNORMAL HIGH (ref 0.61–1.24)
Creatinine, Ser: 3.42 mg/dL — ABNORMAL HIGH (ref 0.61–1.24)
GFR, Estimated: 19 mL/min — ABNORMAL LOW (ref >60)
GFR, Estimated: 21 mL/min — ABNORMAL LOW (ref >60)
GFR, Estimated: 20 mL/min — ABNORMAL LOW (ref >60)
GFR, Estimated: 18 mL/min — ABNORMAL LOW (ref >60)
Glucose, Bld: 208 mg/dL — ABNORMAL HIGH (ref 70–99)
Glucose, Bld: 303 mg/dL — ABNORMAL HIGH (ref 70–99)
Glucose, Bld: 182 mg/dL — ABNORMAL HIGH (ref 70–99)
Glucose, Bld: 355 mg/dL — ABNORMAL HIGH (ref 70–99)
Potassium: 3.4 mmol/L — ABNORMAL LOW (ref 3.5–5.1)
Potassium: 3.2 mmol/L — ABNORMAL LOW (ref 3.5–5.1)
Potassium: 3.6 mmol/L (ref 3.5–5.1)
Potassium: 4.3 mmol/L (ref 3.5–5.1)
Sodium: 134 mmol/L — ABNORMAL LOW (ref 135–145)
Sodium: 135 mmol/L (ref 135–145)
Sodium: 136 mmol/L (ref 135–145)
Sodium: 131 mmol/L — ABNORMAL LOW (ref 135–145)

## 2024-11-16 LAB — CBC WITH DIFFERENTIAL/PLATELET
Abs Immature Granulocytes: 0.03 K/uL (ref 0.00–0.07)
Basophils Absolute: 0 K/uL (ref 0.0–0.1)
Basophils Relative: 0 %
Eosinophils Absolute: 0.2 K/uL (ref 0.0–0.5)
Eosinophils Relative: 2 %
HCT: 23.8 % — ABNORMAL LOW (ref 39.0–52.0)
Hemoglobin: 7.4 g/dL — ABNORMAL LOW (ref 13.0–17.0)
Immature Granulocytes: 0 %
Lymphocytes Relative: 18 %
Lymphs Abs: 1.3 K/uL (ref 0.7–4.0)
MCH: 29 pg (ref 26.0–34.0)
MCHC: 31.1 g/dL (ref 30.0–36.0)
MCV: 93.3 fL (ref 80.0–100.0)
Monocytes Absolute: 0.8 K/uL (ref 0.1–1.0)
Monocytes Relative: 11 %
Neutro Abs: 4.9 K/uL (ref 1.7–7.7)
Neutrophils Relative %: 69 %
Platelets: 198 K/uL (ref 150–400)
RBC: 2.55 MIL/uL — ABNORMAL LOW (ref 4.22–5.81)
RDW: 14.8 % (ref 11.5–15.5)
WBC: 7.2 K/uL (ref 4.0–10.5)
nRBC: 0 % (ref 0.0–0.2)

## 2024-11-16 LAB — PROTIME-INR
INR: 1.2 (ref 0.8–1.2)
Prothrombin Time: 16.1 s — ABNORMAL HIGH (ref 11.4–15.2)

## 2024-11-16 LAB — MAGNESIUM
Magnesium: 2 mg/dL (ref 1.7–2.4)
Magnesium: 1.9 mg/dL (ref 1.7–2.4)

## 2024-11-16 LAB — CBG MONITORING, ED
Glucose-Capillary: 162 mg/dL — ABNORMAL HIGH (ref 70–99)
Glucose-Capillary: 200 mg/dL — ABNORMAL HIGH (ref 70–99)
Glucose-Capillary: 218 mg/dL — ABNORMAL HIGH (ref 70–99)
Glucose-Capillary: 225 mg/dL — ABNORMAL HIGH (ref 70–99)
Glucose-Capillary: 230 mg/dL — ABNORMAL HIGH (ref 70–99)
Glucose-Capillary: 241 mg/dL — ABNORMAL HIGH (ref 70–99)
Glucose-Capillary: 255 mg/dL — ABNORMAL HIGH (ref 70–99)
Glucose-Capillary: 264 mg/dL — ABNORMAL HIGH (ref 70–99)
Glucose-Capillary: 268 mg/dL — ABNORMAL HIGH (ref 70–99)
Glucose-Capillary: 291 mg/dL — ABNORMAL HIGH (ref 70–99)
Glucose-Capillary: 298 mg/dL — ABNORMAL HIGH (ref 70–99)
Glucose-Capillary: 301 mg/dL — ABNORMAL HIGH (ref 70–99)
Glucose-Capillary: 311 mg/dL — ABNORMAL HIGH (ref 70–99)

## 2024-11-16 LAB — BLOOD GAS, VENOUS
Acid-Base Excess: 11.4 mmol/L — ABNORMAL HIGH (ref 0.0–2.0)
Bicarbonate: 35.9 mmol/L — ABNORMAL HIGH (ref 20.0–28.0)
O2 Saturation: 84.9 %
Patient temperature: 37
pCO2, Ven: 45 mmHg (ref 44–60)
pH, Ven: 7.51 — ABNORMAL HIGH (ref 7.25–7.43)
pO2, Ven: 48 mmHg — ABNORMAL HIGH (ref 32–45)

## 2024-11-16 LAB — APTT: aPTT: 29 s (ref 24–36)

## 2024-11-16 LAB — GLUCOSE, CAPILLARY: Glucose-Capillary: 358 mg/dL — ABNORMAL HIGH (ref 70–99)

## 2024-11-16 LAB — CK: Total CK: 92 U/L (ref 49–397)

## 2024-11-16 LAB — PHOSPHORUS: Phosphorus: 3.7 mg/dL (ref 2.5–4.6)

## 2024-11-16 LAB — TSH: TSH: 1.418 u[IU]/mL (ref 0.350–4.500)

## 2024-11-16 LAB — FERRITIN: Ferritin: 13 ng/mL — ABNORMAL LOW (ref 24–336)

## 2024-11-16 LAB — VITAMIN B12: Vitamin B-12: 401 pg/mL (ref 180–914)

## 2024-11-16 LAB — BETA-HYDROXYBUTYRIC ACID: Beta-Hydroxybutyric Acid: 0.07 mmol/L (ref 0.05–0.27)

## 2024-11-16 MED ORDER — POTASSIUM CHLORIDE CRYS ER 20 MEQ PO TBCR
40.0000 meq | EXTENDED_RELEASE_TABLET | ORAL | Status: AC
  Administered 2024-11-16 (×2): 40 meq via ORAL
  Filled 2024-11-16 (×2): qty 2

## 2024-11-16 MED ORDER — INSULIN ASPART 100 UNIT/ML IJ SOLN
0.0000 [IU] | Freq: Every day | INTRAMUSCULAR | Status: DC
  Administered 2024-11-16: 5 [IU] via SUBCUTANEOUS
  Administered 2024-11-17: 3 [IU] via SUBCUTANEOUS
  Filled 2024-11-16: qty 5
  Filled 2024-11-16: qty 3

## 2024-11-16 MED ORDER — COLLAGENASE 250 UNIT/GM EX OINT
TOPICAL_OINTMENT | Freq: Every day | CUTANEOUS | Status: DC
  Filled 2024-11-16: qty 30

## 2024-11-16 MED ORDER — SODIUM CHLORIDE 0.9 % IV SOLN
1.0000 g | Freq: Every day | INTRAVENOUS | Status: DC
  Administered 2024-11-16 – 2024-11-17 (×3): 1 g via INTRAVENOUS
  Filled 2024-11-16 (×3): qty 10

## 2024-11-16 MED ORDER — ROSUVASTATIN CALCIUM 20 MG PO TABS
20.0000 mg | ORAL_TABLET | Freq: Every day | ORAL | Status: DC
  Administered 2024-11-16 – 2024-11-18 (×3): 20 mg via ORAL
  Filled 2024-11-16 (×3): qty 1

## 2024-11-16 MED ORDER — LACTATED RINGERS IV BOLUS
1000.0000 mL | Freq: Once | INTRAVENOUS | Status: AC
  Administered 2024-11-16: 1000 mL via INTRAVENOUS

## 2024-11-16 MED ORDER — LACTATED RINGERS IV BOLUS
500.0000 mL | Freq: Once | INTRAVENOUS | Status: AC
  Administered 2024-11-16: 500 mL via INTRAVENOUS

## 2024-11-16 MED ORDER — HYDROCERIN EX CREA
TOPICAL_CREAM | Freq: Every day | CUTANEOUS | Status: DC
  Filled 2024-11-16: qty 113

## 2024-11-16 MED ORDER — METOPROLOL TARTRATE 25 MG PO TABS
25.0000 mg | ORAL_TABLET | Freq: Every day | ORAL | Status: DC
  Administered 2024-11-16 – 2024-11-18 (×3): 25 mg via ORAL
  Filled 2024-11-16 (×3): qty 1

## 2024-11-16 MED ORDER — INSULIN ASPART 100 UNIT/ML IJ SOLN
0.0000 [IU] | Freq: Three times a day (TID) | INTRAMUSCULAR | Status: DC
  Administered 2024-11-16: 11 [IU] via SUBCUTANEOUS
  Administered 2024-11-17: 15 [IU] via SUBCUTANEOUS
  Administered 2024-11-17: 5 [IU] via SUBCUTANEOUS
  Administered 2024-11-17: 8 [IU] via SUBCUTANEOUS
  Administered 2024-11-18: 2 [IU] via SUBCUTANEOUS
  Administered 2024-11-18: 3 [IU] via SUBCUTANEOUS
  Filled 2024-11-16: qty 11
  Filled 2024-11-16: qty 5
  Filled 2024-11-16: qty 8
  Filled 2024-11-16: qty 5
  Filled 2024-11-16: qty 3
  Filled 2024-11-16: qty 10

## 2024-11-16 MED ORDER — NICOTINE 21 MG/24HR TD PT24: 21.0000 mg | MEDICATED_PATCH | Freq: Every day | TRANSDERMAL | Status: DC | PRN

## 2024-11-16 MED ORDER — INSULIN GLARGINE-YFGN 100 UNIT/ML ~~LOC~~ SOLN
20.0000 [IU] | Freq: Every day | SUBCUTANEOUS | Status: DC
  Administered 2024-11-16: 20 [IU] via SUBCUTANEOUS
  Filled 2024-11-16 (×2): qty 0.2

## 2024-11-16 MED ORDER — ASPIRIN 81 MG PO TBEC
81.0000 mg | DELAYED_RELEASE_TABLET | Freq: Every day | ORAL | Status: DC
  Administered 2024-11-16 – 2024-11-18 (×3): 81 mg via ORAL
  Filled 2024-11-16 (×3): qty 1

## 2024-11-16 MED ORDER — VANCOMYCIN HCL 1250 MG/250ML IV SOLN
1250.0000 mg | INTRAVENOUS | Status: DC
  Administered 2024-11-16: 1250 mg via INTRAVENOUS
  Filled 2024-11-16 (×3): qty 250

## 2024-11-16 MED ORDER — FERROUS SULFATE 325 (65 FE) MG PO TABS
325.0000 mg | ORAL_TABLET | Freq: Every day | ORAL | Status: DC
  Administered 2024-11-16 – 2024-11-18 (×3): 325 mg via ORAL
  Filled 2024-11-16 (×3): qty 1

## 2024-11-16 MED ORDER — NICOTINE POLACRILEX 2 MG MT GUM: 2.0000 mg | CHEWING_GUM | OROMUCOSAL | Status: DC | PRN

## 2024-11-16 NOTE — ED Notes (Signed)
 Placed mepilex on patients bottom

## 2024-11-16 NOTE — Progress Notes (Signed)
 PROGRESS NOTE    [name]  FMW:[id number] DOB: December 19, 1951 DOA: 11/15/2024 PCP: Lizette Macario BRAVO, NP (Inactive)   Brief Narrative:  Erik Martin is a 73 y.o. male with medical history significant for type 2 diabetes mellitus, send hypertension, hyperlipidemia, stage IV CKD with baseline creatinine 3.2-3.7, anemia of chronic kidney disease with baseline hemoglobin 8-10, who is admitted to [facility] on 11/15/2024 with suspected HHNK after presenting from home to [facility] ED complaining of elevated blood sugar.   Assessment & Plan:   Principal Problem:   Hyperosmolar hyperglycemic state (HHS) (HCC) Active Problems:   DM2 (diabetes mellitus, type 2) (HCC)   HLD (hyperlipidemia)   Hyperglycemia   Hypokalemia   Generalized weakness   Dehydration   History of essential hypertension   CKD (chronic kidney disease), stage IV (HCC)   Tobacco abuse   Anemia due to chronic kidney disease   Cellulitis of right lower extremity  Hyperglycemic hyperosmolar nonketotic state/dehydration: In the setting of known history of type 2 diabetes mellitus, previously not insulin -dependent, he presents with hyperglycemia, with initial blood sugar of 655 with corresponding evidence of elevated serum osmolality, with presenting serum osmolality noted to be 326.  Did not meet criteria for DKA.  Last hemoglobin A1c 6.3% however that was 10 years ago and now it is over 14.  Suspect some acute worsening in his glycemic control given the loss of his main caregiver/ his wife, who passed away 2 weeks ago.  Patient received significant amount of fluids in the ED.  Blood sugar improving, will start on Lantus 20 units and then stop insulin  and dextrose  2 to 3 hours after that and start on SSI.  Hypokalemia: Will aggressively replenish.   Cellulitis of the right lower extremity: Continue Rocephin.  Doubt MRSA.  Will discontinue vancomycin .  Generalized weakness: Likely due to overall deconditioning, loss of caregiver 2  weeks ago, HHS as well as cellulitis.  PT OT consulted.  Family interested in placement.  Will consult TOC.   Essential Hypertension: outpatient antihypertensive regimen including amlodipine , metoprolol  tartrate, which he reportedly takes on a daily basis.  SBP's in the ED today: Initially in the 80s, with subsequent improvement but is low.  Holding all medications.   Hyperlipidemia: Continue statin.   CKD Stage 4: Last creatinine available in the chart was 10 years ago which was normal.  Creatinine now has been around 3.3-3.6 and has been consistently stable at that level indicating likely at baseline/CKD stage IV.  Avoid nephrotoxins for now and monitor closely.    Chronic tobacco abuse: Patient conveys that they are a current smoker, having smoked 1 ppd for approximately 40 years.  I have discussed tobacco cessation with the patient.  I have counseled the patient regarding the negative impacts of continued tobacco use including but not limited to lung cancer, COPD, and cardiovascular disease.  I have discussed alternatives to tobacco and modalities that may help facilitate tobacco cessation including but not limited to biofeedback, hypnosis, and medications.  Total time spent with tobacco counseling was 5 minutes.  Combination of anemia of chronic kidney disease and iron deficiency anemia: Stable.  Continue iron supplements.  Moderate protein calorie malnutrition: Consult dietitian.  DVT prophylaxis: SCDs Start: 11/15/24 2320   Code Status: Full Code  Family Communication:  None present at bedside.  Plan of care discussed with patient in length and he/she verbalized understanding and agreed with it.  Status is: Observation The patient will require care spanning > 2  midnights and should be moved to inpatient because: Needs to remain in the hospital for further management and observation.   Estimated body mass index is 36.46 kg/m as calculated from the following:   Height as of this  encounter: 5' 9 (1.753 m).   Weight as of this encounter: 112 kg.    Nutritional Assessment: Body mass index is 36.46 kg/m.SABRA Seen by dietician.  I agree with the assessment and plan as outlined below: Nutrition Status:        . Skin Assessment: I have examined the patient's skin and I agree with the wound assessment as performed by the wound care RN as outlined below:    Consultants:  None  Procedures:  None  Antimicrobials:  Anti-infectives (From admission, onward)    Start     Dose/Rate Route Frequency Ordered Stop   11/16/24 0630  vancomycin  (VANCOREADY) IVPB 1250 mg/250 mL        1,250 mg 166.7 mL/hr over 90 Minutes Intravenous Every 48 hours 11/16/24 0626     11/16/24 0115  cefTRIAXone (ROCEPHIN) 1 g in sodium chloride  0.9 % 100 mL IVPB        1 g 200 mL/hr over 30 Minutes Intravenous Daily at bedtime 11/16/24 0102           Subjective: Patient seen and examined the ED, nurse and occupational therapist at the bedside.  Patient was sitting at the edge of the bed.  He was fully alert and oriented.  He had no complaints at all.  Objective: Vitals:   11/16/24 0630 11/16/24 0655 11/16/24 0700 11/16/24 0730  BP: (!) 100/59  106/61 (!) 102/56  Pulse: (!) 59  (!) 58 (!) 59  Resp: 16  19 11   Temp:  (!) 97.3 F (36.3 C)    TempSrc:  Axillary    SpO2: 100%  100% 99%  Weight:      Height:        Intake/Output Summary (Last 24 hours) at 11/16/2024 0759 Last data filed at 11/16/2024 0519 Gross per 24 hour  Intake 26.22 ml  Output --  Net 26.22 ml   Filed Weights   11/16/24 0515  Weight: 112 kg    Examination:  General exam: Appears calm and comfortable  Respiratory system: Clear to auscultation. Respiratory effort normal. Cardiovascular system: S1 & S2 heard, RRR. No JVD, murmurs, rubs, gallops or clicks. No pedal edema. Gastrointestinal system: Abdomen is nondistended, soft and nontender. No organomegaly or masses felt. Normal bowel sounds  heard. Central nervous system: Alert and oriented. No focal neurological deficits. Extremities: Left BKA. Skin: Mild erythema right lower extremity. Psychiatry: Judgement and insight appear normal. Mood & affect appropriate.    Data Reviewed: I have personally reviewed following labs and imaging studies  CBC: Recent Labs  Lab 11/15/24 2036 11/16/24 0511  WBC 7.1 7.2  NEUTROABS 5.7 4.9  HGB 8.6* 7.4*  HCT 28.1* 23.8*  MCV 93.7 93.3  PLT 230 198   Basic Metabolic Panel: Recent Labs  Lab 11/15/24 2036 11/16/24 0100 11/16/24 0511  NA 128* 134* 132*  K 3.2* 3.4* 3.2*  CL 84* 90* 90*  CO2 26 29 29   GLUCOSE 655* 208* 260*  BUN 53* 47* 45*  CREATININE 3.61* 3.32* 3.26*  CALCIUM  9.0 9.3 9.1  MG  --  2.0 1.9  PHOS  --   --  3.7   GFR: Estimated Creatinine Clearance: 24.9 mL/min (A) (by C-G formula based on SCr of 3.26 mg/dL (H)). Liver Function  Tests: Recent Labs  Lab 11/16/24 0511  AST 19  ALT 14  ALKPHOS 44  BILITOT 0.5  PROT 5.5*  ALBUMIN  2.5*   No results for input(s): LIPASE, AMYLASE in the last 168 hours. No results for input(s): AMMONIA in the last 168 hours. Coagulation Profile: Recent Labs  Lab 11/16/24 0511  INR 1.2   Cardiac Enzymes: Recent Labs  Lab 11/16/24 0100  CKTOTAL 92   BNP (last 3 results) No results for input(s): PROBNP in the last 8760 hours. HbA1C: Recent Labs    11/15/24 2036  HGBA1C 14.7*   CBG: Recent Labs  Lab 11/16/24 0251 11/16/24 0418 11/16/24 0509 11/16/24 0617 11/16/24 0723  GLUCAP 200* 264* 255* 218* 230*   Lipid Profile: No results for input(s): CHOL, HDL, LDLCALC, TRIG, CHOLHDL, LDLDIRECT in the last 72 hours. Thyroid Function Tests: Recent Labs    11/15/24 2036  TSH 1.418   Anemia Panel: Recent Labs    11/16/24 0100 11/16/24 0511  VITAMINB12  --  401  FERRITIN 13*  --   TIBC 281  --   IRON 17*  --    Sepsis Labs: No results for input(s): PROCALCITON, LATICACIDVEN in  the last 168 hours.  No results found for this or any previous visit (from the past 240 hours).   Radiology Studies: DG Foot 2 Views Right Result Date: 11/16/2024 EXAM: 1 OR 2 VIEW(S) XRAY OF THE RIGHT FOOT 11/16/2024 01:43:56 AM COMPARISON: None available. CLINICAL HISTORY: Cellulitis of right lower extremity. FINDINGS: BONES AND JOINTS: No acute fracture. Tiny plantar calcaneal spur. No joint dislocation. SOFT TISSUES: Mild diffuse soft tissue edema. Vascular calcifications. IMPRESSION: 1. Mild diffuse soft tissue edema, possibly related to cellulitis. Electronically signed by: Morgane Naveau MD 11/16/2024 02:00 AM EST RP Workstation: [id number]   DG Chest Port 1 View Result Date: 11/16/2024 EXAM: 1 VIEW(S) XRAY OF THE CHEST 11/16/2024 01:43:56 AM COMPARISON: CXR 07/26/2017, CT Chest 08/26/2024. CLINICAL HISTORY: Hyperglycemia FINDINGS: LUNGS AND PLEURA: Low lung volumes. No focal pulmonary opacity. No pleural effusion. No pneumothorax. HEART AND MEDIASTINUM: Prominent cardiac silhouette with some component likely due to AP portable technique. Otherwise unremarkable cardiomediastinal silhouette. Atherosclerotic plaque of the aorta and coronary arteries. BONES AND SOFT TISSUES: No acute osseous abnormality. IMPRESSION: 1. No acute findings. Electronically signed by: Morgane Naveau MD 11/16/2024 01:54 AM EST RP Workstation: [id number]   DG Tibia/Fibula Right Result Date: 11/16/2024 EXAM: _VIEWS_ VIEW(S) XRAY OF THE RIGHT TIBIA AND FIBULA 11/16/2024 01:43:56 AM COMPARISON: None available. CLINICAL HISTORY: Cellulitis of right lower extremity FINDINGS: BONES AND JOINTS: No acute fracture. No focal osseous lesion. No joint dislocation. SOFT TISSUES: Diffuse soft tissue edema. Vascular calcifications. IMPRESSION: 1. Diffuse soft tissue edema, suggestive of cellulitis. Electronically signed by: Morgane Naveau MD 11/16/2024 01:52 AM EST RP Workstation: [id number]    Scheduled Meds:  aspirin  EC  81 mg  Oral Daily   ferrous sulfate   325 mg Oral Q breakfast   insulin  glargine  20 Units Subcutaneous Daily   metoprolol  tartrate  25 mg Oral Daily   potassium chloride   40 mEq Oral Q4H   rosuvastatin  20 mg Oral Daily   Continuous Infusions:  cefTRIAXone (ROCEPHIN)  IV Stopped (11/16/24 0339)   dextrose  5% lactated ringers  100 mL/hr at 11/16/24 0154   insulin  2.8 Units/hr (11/16/24 0734)   lactated ringers  100 mL/hr at 11/16/24 0758   vancomycin        LOS: 0 days   Fredia Skeeter, MD [facility]  11/16/2024, 7:59 AM   *  Please note that this is a verbal dictation therefore any spelling or grammatical errors are due to the Dragon Medical One system interpretation.  Please page via Amion and do not message via secure chat for urgent patient care matters. Secure chat can be used for non urgent patient care matters.  How to contact the [facility] Attending or Consulting provider 7A - 7P or covering provider during after hours 7P -7A, for this patient?  Check the care team in [facility] and look for a) attending/consulting TR[facility] provider listed and b) the [facility] team listed. Page or secure chat 7A-7P. Log into [email or web address] and use [place]'s universal password to access. If you do not have the password, please contact the hospital operator. Locate the [facility] provider you are looking for under [facility] and page to a number that you can be directly reached. If you still have difficulty reaching the provider, please page the [facility] (Director on Call) for the Hospitalists listed on amion for assistance.

## 2024-11-16 NOTE — Evaluation (Signed)
 Occupational Therapy Evaluation Patient Details Name: Erik Martin MRN: [id number] DOB: 11-20-1951 Today's Date: 11/16/2024   History of Present Illness   Erik Martin is a 73 y.o. male who presented to ED complaining of elevated blood sugar. PHMx: DM2, hypertension, hyperlipidemia, stage IV CKD, anemia of chronic kidney disease with baseline hemoglobin 8-10, lumbar pain s/p back sx, depression, L AKA.     Clinical Impressions This 73 yo male admitted with above presents to acute OT with PLOF of being Independent to Mod I with all basic ADLs mostly from a W/C standpoint but using RW to hop from bathroom door to toilet. He currently is Max A-total A +2 for in and OOB and setup-total A +2 for ADLs. His wife passed away 2 weeks ago and she did a lot to care for him per chart. He will continue to benefit from acute OT with follow up from continued inpatient follow up therapy <3 hours per day.     If plan is discharge home, recommend the following:   Two people to help with walking and/or transfers;Two people to help with bathing/dressing/bathroom;Assistance with cooking/housework;Assist for transportation;Help with stairs or ramp for entrance     Functional Status Assessment   Patient has had a recent decline in their functional status and demonstrates the ability to make significant improvements in function in a reasonable and predictable amount of time.     Equipment Recommendations   None recommended by OT      Precautions/Restrictions   Precautions Precautions: Fall Recall of Precautions/Restrictions: Impaired Restrictions Weight Bearing Restrictions Per Provider Order: No     Mobility Bed Mobility Overal bed mobility: Needs Assistance Bed Mobility: Supine to Sit, Sit to Supine     Supine to sit: Max assist, HOB elevated, Used rails (VCs for hand placement) Sit to supine: Total assist, +2 for physical assistance   General bed mobility comments: due to  positioning in bed and needing to be pulled up    Transfers                   General transfer comment: not safe to try and this time due to patient's weakness      Balance Overall balance assessment: Needs assistance Sitting-balance support: No upper extremity supported (RLE supported) Sitting balance-Leahy Scale: Fair                                     ADL either performed or assessed with clinical judgement   ADL Overall ADL's : Needs assistance/impaired Eating/Feeding: Independent;Sitting Eating/Feeding Details (indicate cue type and reason): EOB Grooming: Set up;Sitting Grooming Details (indicate cue type and reason): EOB Upper Body Bathing: Sitting;Minimal assistance Upper Body Bathing Details (indicate cue type and reason): EOB Lower Body Bathing: Total assistance;Bed level   Upper Body Dressing : Moderate assistance;Sitting Upper Body Dressing Details (indicate cue type and reason): EOB Lower Body Dressing: Total assistance;Bed level                       Vision Baseline Vision/History: 1 Wears glasses (reading) Ability to See in Adequate Light: 0 Adequate Patient Visual Report: No change from baseline              Pertinent Vitals/Pain Pain Assessment Pain Assessment: No/denies pain     Extremity/Trunk Assessment Upper Extremity Assessment Upper Extremity Assessment: Generalized weakness  Communication Communication Communication: No apparent difficulties   Cognition Arousal: Alert Behavior During Therapy: Flat affect Cognition: No apparent impairments                               Following commands: Intact       Cueing    Cueing Techniques: Verbal cues              Home Living Family/patient expects to be discharged to:: Private residence Living Arrangements: Spouse/significant other Available Help at Discharge: Family;Available PRN/intermittently Type of Home: House Home  Access: Ramped entrance     Home Layout: One level     Bathroom Shower/Tub: Tub/shower unit;Sponge bathes at baseline   [employer]: Standard     Home Equipment: Agricultural Consultant (2 wheels);Wheelchair - manual   Additional Comments: wife passed away 2 weeks ago and she did alot to take care of him      Prior Functioning/Environment Prior Level of Function : Independent/Modified Independent             Mobility Comments: Pt states he normally gets around in his W/C and hops with use of RW only to get into bathroom from W/C at the door. ADLs Comments: Reports Mod I from bed and W/C level for B/D and hops with use of RW from bathroom door to get to toilet, sponge bathes    OT Problem List: Decreased strength;Impaired balance (sitting and/or standing);Obesity   OT Treatment/Interventions: Self-care/ADL training;DME and/or AE instruction;Patient/family education;Balance training      OT Goals(Current goals can be found in the care plan section)   Acute Rehab OT Goals Patient Stated Goal: to get stronger OT Goal Formulation: With patient   OT Frequency:  Min 2X/week       AM-PAC OT 6 Clicks Daily Activity     Outcome Measure Help from another person eating meals?: None Help from another person taking care of personal grooming?: A Little Help from another person toileting, which includes using toliet, bedpan, or urinal?: Total Help from another person bathing (including washing, rinsing, drying)?: A Lot Help from another person to put on and taking off regular upper body clothing?: A Lot Help from another person to put on and taking off regular lower body clothing?: Total 6 Click Score: 13   End of Session Nurse Communication:  (NT that helped slide him up in bed with me stated she would tell the pt's RN about his right UE IV site (bleeding).)  Activity Tolerance: Patient tolerated treatment well Patient left: in bed;with call bell/phone within reach;with bed  alarm set  OT Visit Diagnosis: Other abnormalities of gait and mobility (R26.89);Unsteadiness on feet (R26.81);Muscle weakness (generalized) (M62.81)                Time: 9197-9151 OT Time Calculation (min): 46 min Charges:  OT General Charges $OT Visit: 1 Visit OT Evaluation $OT Eval Moderate Complexity: 1 Mod OT Treatments $Self Care/Home Management : 23-37 mins  Donny BECKER OT Acute Rehabilitation Services Office [phone]   [place] Dorothyann Distel 11/16/2024, 11:43 AM

## 2024-11-16 NOTE — ED Notes (Signed)
Admitting at beside 

## 2024-11-16 NOTE — Progress Notes (Signed)
 Pharmacy Antibiotic Note  Erik Martin is a 73 y.o. male admitted on 11/15/2024 with cellulitis.  Pharmacy has been consulted for Vancomycin  dosing. WBC WNL. Noted renal dysfunction.  Plan: Vancomycin  1250 mg IV q48h >>>Estimated AUC: 528 Ceftriaxone per MD Trend WBC, temp, renal function  F/U infectious work-up Drug levels as indicated   Height: 5' 9 (175.3 cm) Weight: 112 kg (246 lb 14.6 oz) IBW/kg (Calculated) : 70.7  Temp (24hrs), Avg:98.1 F (36.7 C), Min:97.8 F (36.6 C), Max:98.3 F (36.8 C)  Recent Labs  Lab 11/15/24 2036 11/16/24 0100 11/16/24 0511  WBC 7.1  --  7.2  CREATININE 3.61* 3.32* 3.26*    Estimated Creatinine Clearance: 24.9 mL/min (A) (by C-G formula based on SCr of 3.26 mg/dL (H)).    Allergies  Allergen Reactions   Nsaids Other (See Comments)    H/O renal failure.   Tolmetin     Other reaction(s): Other (See Comments) H/O renal failure.   Glimepiride      Other reaction(s): Other (See Comments)   Niaspan [Niacin] Other (See Comments)    Flushing, burning    Lynwood Mckusick, PharmD, BCPS Clinical Pharmacist Phone: [phone]

## 2024-11-16 NOTE — ED Notes (Signed)
 Pt bp running low with maps in the 60s. MD advised and new orders placed

## 2024-11-16 NOTE — ED Notes (Signed)
 Pt restless, restless, uncomfortable and Complaining of pain on bottom. PRN medication given and transferred to hospital bed.

## 2024-11-16 NOTE — ED Notes (Signed)
 Per admitting verbal order, 2 hrs after Lantus admn, D5LR changed to regular LR d/t his last 2 CBGs remaining above 250 consecutively & insulin  drip continued.

## 2024-11-16 NOTE — Inpatient Diabetes Management (Signed)
 Inpatient Diabetes Program Recommendations  AACE/ADA: New Consensus Statement on Inpatient Glycemic Control (2015)  Target Ranges:  Prepandial:   less than 140 mg/dL      Peak postprandial:   less than 180 mg/dL (1-2 hours)      Critically ill patients:  140 - 180 mg/dL   Lab Results  Component Value Date   GLUCAP 241 (H) 11/16/2024   HGBA1C 14.7 (H) 11/15/2024    Review of Glycemic Control  Latest Reference Range & Units 11/16/24 02:51 11/16/24 04:18 11/16/24 05:09 11/16/24 06:17 11/16/24 07:23 11/16/24 08:30  Glucose-Capillary 70 - 99 mg/dL 799 (H) 735 (H) 744 (H) 218 (H) 230 (H) 241 (H)  (H): Data is abnormally high Diabetes history: Type 2 DM Outpatient Diabetes medications: Actos  45 mg QD Current orders for Inpatient glycemic control: IV insulin  to Semglee 20 units every day, Novolog  0-15 units tID & HS  Inpatient Diabetes Program Recommendations:    Spoke with patient and daughter regarding outpatient diabetes management. Need to include family in conversations related to patient's care. Has been injecting Trulicity every week independently. Reviewed patient's current A1c of 14.7%. Explained what a A1c is and what it measures. Also reviewed goal A1c with patient, importance of good glucose control @ home, and blood sugar goals. Reviewed patho of dM, need for improved control, hypo vs hyper glycemia, interventions, vascular changes and commorbidities.  Patient has a meter and prefers to use this method. Reviewed recommended frequency for checking blood sugars and when to contact MD.  Admits to drinking sweet tea. reviewed alternatives to sugary beverages, importance of protein, and plate method.  Educated patient and spouse on insulin  pen use at home. Reviewed contents of insulin  flexpen starter kit. Reviewed all steps of insulin  pen including attachment of needle, 2-unit air shot, dialing up dose, giving injection, removing needle, disposal of sharps, storage of unused insulin ,  disposal of insulin  etc. Patient unable to provide successful return demonstration. Also reviewed troubleshooting with insulin  pen. MD to give patient Rxs for insulin  pens and insulin  pen needles.  Will need reinforcement with family closer to discharge.   Thanks, Tinnie Minus, MSN, RNC-OB Diabetes Coordinator [phone] (8a-5p)

## 2024-11-16 NOTE — Progress Notes (Signed)
-----------------------------------------------------------  [facility] --------------------------------------------------------D(Data) A(Action) R(response) Note------------------------------------------------  Patient Name: Erik Martin Public Patient DOB: 1951/04/12 Date: @TODAY @      Data: Reviewed labs, VS, notes.    Action: No action needed at this time.      Response:       Sharolyn Batman, RN [facility]

## 2024-11-16 NOTE — Evaluation (Signed)
 Physical Therapy Evaluation Patient Details Name: Erik Martin MRN: [id number] DOB: [birth year] Today's Date: 11/16/2024  History of Present Illness  Erik Martin is a 73 y.o. male who presented to ED complaining of elevated blood sugar. PHMx: DM2, hypertension, hyperlipidemia, stage IV CKD, anemia of chronic kidney disease with baseline hemoglobin 8-10, lumbar pain s/p back sx, depression, L AKA.   Clinical Impression  Pt admitted with above diagnosis. PTA pt lived at home alone (his wife passed away 2 weeks ago), mod household mobility primarily at w/c level. He did use RW in/out of bathroom. Pt currently with functional limitations due to the deficits listed below (see PT Problem List). On eval, pt required max assist bed mobility, and max assist lateral scoots EOB. He demo fair sitting balance. Generalized weakness and decreased activity tolerance. Pt will benefit from acute skilled PT to increase their independence and safety with mobility to allow discharge. Post acute, pt would benefit from further therapy in inpatient setting < 3 hours/day.          If plan is discharge home, recommend the following: Two people to help with bathing/dressing/bathroom;Two people to help with walking and/or transfers   Can travel by private vehicle   No    Equipment Recommendations None recommended by PT  Recommendations for Other Services       Functional Status Assessment Patient has had a recent decline in their functional status and demonstrates the ability to make significant improvements in function in a reasonable and predictable amount of time.     Precautions / Restrictions Precautions Precautions: Fall;Other (comment) Recall of Precautions/Restrictions: Impaired Precaution/Restrictions Comments: h/o L AKA      Mobility  Bed Mobility Overal bed mobility: Needs Assistance Bed Mobility: Supine to Sit, Sit to Supine     Supine to sit: Max assist, HOB elevated, Used rails Sit to  supine: Max assist, Used rails   General bed mobility comments: assist with RLE and trunk, use of bed pad to scoot to EOB    Transfers                   General transfer comment: lateral scooting EOB max assist, increased time    Ambulation/Gait                  Stairs            Wheelchair Mobility     Tilt Bed    Modified Rankin (Stroke Patients Only)       Balance Overall balance assessment: Needs assistance Sitting-balance support: No upper extremity supported, Feet supported Sitting balance-Leahy Scale: Fair                                       Pertinent Vitals/Pain Pain Assessment Pain Assessment: Faces Faces Pain Scale: Hurts a little bit Pain Location: scrotum Pain Descriptors / Indicators: Tender, Grimacing Pain Intervention(s): Monitored during session    Home Living Family/patient expects to be discharged to:: Private residence Living Arrangements: Alone Available Help at Discharge: Family;Available PRN/intermittently Type of Home: House Home Access: Ramped entrance       Home Layout: One level Home Equipment: Agricultural Consultant (2 wheels);Wheelchair - manual Additional Comments: wife passed away 2 weeks ago and she did alot to take care of him    Prior Function Prior Level of Function : Independent/Modified Independent  Mobility Comments: Pt states he normally gets around in his W/C and hops with use of RW only to get into bathroom from W/C at the door. ADLs Comments: Reports Mod I from bed and W/C level for B/D and hops with use of RW from bathroom door to get to toilet, sponge bathes     Extremity/Trunk Assessment   Upper Extremity Assessment Upper Extremity Assessment: Generalized weakness    Lower Extremity Assessment Lower Extremity Assessment: Generalized weakness;LLE deficits/detail LLE Deficits / Details: h/o AKA    Cervical / Trunk Assessment Cervical / Trunk Assessment:  Kyphotic  Communication   Communication Communication: No apparent difficulties    Cognition Arousal: Alert Behavior During Therapy: Flat affect   PT - Cognitive impairments: No family/caregiver present to determine baseline, Awareness, Problem solving                         Following commands: Intact       Cueing Cueing Techniques: Verbal cues     General Comments      Exercises     Assessment/Plan    PT Assessment Patient needs continued PT services  PT Problem List Decreased strength;Decreased balance;Decreased mobility;Decreased activity tolerance;Decreased safety awareness       PT Treatment Interventions Therapeutic activities;Therapeutic exercise;Patient/family education;Balance training;Functional mobility training;DME instruction    PT Goals (Current goals can be found in the Care Plan section)  Acute Rehab PT Goals Patient Stated Goal: home PT Goal Formulation: With patient Time For Goal Achievement: 11/30/24 Potential to Achieve Goals: Good    Frequency Min 2X/week     Co-evaluation               AM-PAC PT 6 Clicks Mobility  Outcome Measure Help needed turning from your back to your side while in a flat bed without using bedrails?: A Lot Help needed moving from lying on your back to sitting on the side of a flat bed without using bedrails?: A Lot Help needed moving to and from a bed to a chair (including a wheelchair)?: Total Help needed standing up from a chair using your arms (e.g., wheelchair or bedside chair)?: Total Help needed to walk in hospital room?: Total Help needed climbing 3-5 steps with a railing? : Total 6 Click Score: 8    End of Session   Activity Tolerance: Patient tolerated treatment well Patient left: in bed;with call bell/phone within reach;with bed alarm set Nurse Communication: Mobility status PT Visit Diagnosis: Other abnormalities of gait and mobility (R26.89);Muscle weakness (generalized) (M62.81)     Time: 9071-9049 PT Time Calculation (min) (ACUTE ONLY): 22 min   Charges:   PT Evaluation $PT Eval Moderate Complexity: 1 Mod   PT General Charges $$ ACUTE PT VISIT: 1 Visit         Sari MATSU., PT  Office # [phone]   Erven Sari Shaker 11/16/2024, 12:12 PM

## 2024-11-16 NOTE — Consult Note (Signed)
 WOC Nurse Consult Note: Reason for Consult: Consult requested for multiple wounds.  Leeft elbow with partial thickness abrasion, 1.5X1X.1cm, pink and dry Right heel with Stage 2 pressure injury; 4X4X.1cm, pink and dry with loose peeling skin surrounding Right leg with multiple dry scabbed areas and dry crusted peeling skin Right ischium/lower buttock with Unstageable pressure injury; Pt was very difficult to turn and assess and I was unable to take a photos without assistance. Affected area is approx 6X6cm, 50% red, 50% slough/eschar, mod amt tan drainage Pressure Injury POA: Yes Dressing procedure/placement/frequency: Topical treatment orders provided for bedside nurses to perform as follows:  Float right heel to reduce pressure.  1. Apply Santyl  to right ischium/lower buttock wound Q day, then cover with moist gauze and foam dressing.  Change foam dressing Q 3 days or PRN soiling 2. Eucerin to right leg Q day  2. Foam dressing to left elbow, right heel Q day, change Q 3 days or PRN soiling.  Please re-consult if further assistance is needed.  Thank-you,  Stephane Fought MSN, RN, CWOCN, CWCN-AP, CNS Contact Mon-Fri 0700-1500: [phone]

## 2024-11-16 NOTE — ED Notes (Signed)
 Phone placed in the patient's room.

## 2024-11-17 DIAGNOSIS — N184 Chronic kidney disease, stage 4 (severe): Secondary | ICD-10-CM | POA: Diagnosis not present

## 2024-11-17 DIAGNOSIS — D631 Anemia in chronic kidney disease: Secondary | ICD-10-CM | POA: Diagnosis not present

## 2024-11-17 DIAGNOSIS — L03115 Cellulitis of right lower limb: Secondary | ICD-10-CM | POA: Diagnosis not present

## 2024-11-17 DIAGNOSIS — E1100 Type 2 diabetes mellitus with hyperosmolarity without nonketotic hyperglycemic-hyperosmolar coma (NKHHC): Secondary | ICD-10-CM | POA: Diagnosis not present

## 2024-11-17 LAB — GLUCOSE, CAPILLARY
Glucose-Capillary: 371 mg/dL — ABNORMAL HIGH (ref 70–99)
Glucose-Capillary: 267 mg/dL — ABNORMAL HIGH (ref 70–99)
Glucose-Capillary: 216 mg/dL — ABNORMAL HIGH (ref 70–99)
Glucose-Capillary: 256 mg/dL — ABNORMAL HIGH (ref 70–99)

## 2024-11-17 MED ORDER — INSULIN GLARGINE-YFGN 100 UNIT/ML ~~LOC~~ SOLN
28.0000 [IU] | Freq: Every day | SUBCUTANEOUS | Status: DC
  Administered 2024-11-17: 28 [IU] via SUBCUTANEOUS
  Filled 2024-11-17 (×2): qty 0.28

## 2024-11-17 MED ORDER — HEPARIN SODIUM (PORCINE) 5000 UNIT/ML IJ SOLN
5000.0000 [IU] | Freq: Three times a day (TID) | INTRAMUSCULAR | Status: DC
  Administered 2024-11-17 – 2024-11-18 (×3): 5000 [IU] via SUBCUTANEOUS
  Filled 2024-11-17 (×3): qty 1

## 2024-11-17 MED ORDER — SIMETHICONE 80 MG PO CHEW
160.0000 mg | CHEWABLE_TABLET | Freq: Four times a day (QID) | ORAL | Status: DC | PRN
  Administered 2024-11-17 (×2): 160 mg via ORAL
  Filled 2024-11-17 (×2): qty 2

## 2024-11-17 MED ORDER — PANTOPRAZOLE SODIUM 40 MG PO TBEC
40.0000 mg | DELAYED_RELEASE_TABLET | Freq: Every day | ORAL | Status: DC
  Administered 2024-11-17 – 2024-11-18 (×2): 40 mg via ORAL
  Filled 2024-11-17 (×2): qty 1

## 2024-11-17 MED ORDER — INSULIN GLARGINE-YFGN 100 UNIT/ML ~~LOC~~ SOLN: 26.0000 [IU] | Freq: Every day | SUBCUTANEOUS | Status: DC

## 2024-11-17 MED ORDER — SODIUM CHLORIDE 0.9 % IV SOLN
200.0000 mg | Freq: Once | INTRAVENOUS | Status: AC
  Administered 2024-11-17: 200 mg via INTRAVENOUS
  Filled 2024-11-17: qty 10

## 2024-11-17 MED ORDER — INSULIN ASPART 100 UNIT/ML IJ SOLN
4.0000 [IU] | Freq: Three times a day (TID) | INTRAMUSCULAR | Status: DC
  Administered 2024-11-17 – 2024-11-18 (×5): 4 [IU] via SUBCUTANEOUS
  Filled 2024-11-17 (×4): qty 4

## 2024-11-17 MED ORDER — IRON SUCROSE 300 MG IVPB - SIMPLE MED
300.0000 mg | Freq: Once | Status: DC
  Filled 2024-11-17: qty 265

## 2024-11-17 MED ORDER — EZETIMIBE 10 MG PO TABS
10.0000 mg | ORAL_TABLET | Freq: Every day | ORAL | Status: DC
  Administered 2024-11-17 – 2024-11-18 (×2): 10 mg via ORAL
  Filled 2024-11-17 (×2): qty 1

## 2024-11-17 NOTE — Progress Notes (Addendum)
 PROGRESS NOTE        PATIENT DETAILS Name: Erik Martin Age: 73 y.o. Sex: male Date of Birth: 05/03/1951 Admit Date: 11/15/2024 Admitting Physician Eva KATHEE Pore, DO PCP:Lam, Macario BRAVO, NP (Inactive)  Brief Summary: Patient is a 73 y.o.  male with history of DM-2, HTN, HLD, CKD stage IV, left AKA (2021)-presented with generalized weakness-elevated blood sugars-found to have hyperglycemic hyperosmolar nonketotic state-started on insulin  infusion and admitted to the hospitalist service.  Significant events: 11/17>> admit to [facility]  Significant studies: 11/18>> x-ray right tibia/fibula: Diffuse soft tissue edema-suggestive of cellulitis 11/18>> x-ray right foot: Mild diffuse soft tissue edema 11/18>> chest x-ray: No acute findings  Significant microbiology data: None  Procedures: None  Consults: None  Subjective: Sitting up in bed and eating breakfast.  No chest pain or shortness of breath.  Objective: Vitals: Blood pressure 119/62, pulse 96, temperature 98.3 F (36.8 C), temperature source Oral, resp. rate (!) 26, height 5' 9 (1.753 m), weight 114.8 kg, SpO2 90%.   Exam: Gen Exam:Alert awake-not in any distress HEENT:atraumatic, normocephalic Chest: B/L clear to auscultation anteriorly CVS:S1S2 regular Abdomen:soft non tender, non distended Extremities: Mild erythema RLE-s/p left AKA. Neurology: Non focal Skin: no rash  Pertinent Labs/Radiology:    Latest Ref Rng & Units 11/16/2024    5:11 AM 11/15/2024    8:36 PM 11/03/2014    2:15 PM  CBC  WBC 4.0 - 10.5 K/uL 7.2  7.1  7.2   Hemoglobin 13.0 - 17.0 g/dL 7.4  8.6  8.4   Hematocrit 39.0 - 52.0 % 23.8  28.1  27.5   Platelets 150 - 400 K/uL 198  230  256     Lab Results  Component Value Date   NA 131 (L) 11/16/2024   K 4.3 11/16/2024   CL 91 (L) 11/16/2024   CO2 25 11/16/2024     Assessment/Plan: Hyperglycemic hyperosmolar nonketotic state Likely secondary to poor compliance  to insulin  (spouse passed away 2-3 weeks back-she was his primary caregiver) Managed with IV fluids/IV insulin -CBGs better  DM-2 with uncontrolled hyperglycemia (A1c 14.7 on 11/14) See above CBGs remain uncontrolled Increase Semglee  to 28 units, add 4 units of Premeal NovoLog  Continue SSI Follow/optimize   Recent Labs    11/16/24 1725 11/16/24 2034 11/17/24 0753  GLUCAP 311* 358* 371*   R leg cellulitis Mild erythema Stop vancomycin  Continue Rocephin   CKD 4 Creatinine close to baseline Avoid nephrotoxic agents Follow electrolytes periodically.  Normocytic anemia Secondary to CKD 4-and iron  deficiency-no evidence of blood loss IV iron  x 1 dose today Continue oral iron  supplementation  HTN BP stable Continue metoprolol .  HLD Statin  PAD-s/p left AKA (2021) Stump appears benign Aspirin /statin.  Bereavement Spouse passed awat-2-3 weeks before this hospitalization-sounds like she was his primary caregiver-gave/managed medications  Debility/deconditioning Worse-secondary to acute illness PT/OT eval-SNF planned.  Pressure Ulcer: Agree with assessment and plan as outlined below Wound 11/16/24 Pressure Injury Buttocks Right;Lower Unstageable - Full thickness tissue loss in which the base of the injury is covered by slough (yellow, tan, gray, green or brown) and/or eschar (tan, brown or black) in the wound bed. (Active)     Wound 11/16/24 Pressure Injury Heel Right Stage 2 -  Partial thickness loss of dermis presenting as a shallow open injury with a red, pink wound bed without slough. (Active)   Class 2  obesity: Estimated body mass index is 37.37 kg/m as calculated from the following:   Height as of this encounter: 5' 9 (1.753 m).   Weight as of this encounter: 114.8 kg.   Code status:   Code Status: Full Code   DVT Prophylaxis: heparin  injection 5,000 Units Start: 11/17/24 1400 SCDs Start: 11/15/24 2320   Family Communication:  Daughter-Erik [phone]-updated 11/19   Disposition Plan: Status is: Inpatient Remains inpatient appropriate because: Severity of illness   Planned Discharge Destination:Skilled nursing facility   Diet: Diet Order             Diet regular Room service appropriate? Yes; Fluid consistency: Thin  Diet effective now                     Antimicrobial agents: Anti-infectives (From admission, onward)    Start     Dose/Rate Route Frequency Ordered Stop   11/16/24 0630  vancomycin  (VANCOREADY) IVPB 1250 mg/250 mL  Status:  Discontinued        1,250 mg 166.7 mL/hr over 90 Minutes Intravenous Every 48 hours 11/16/24 0626 11/17/24 0947   11/16/24 0115  cefTRIAXone (ROCEPHIN) 1 g in sodium chloride  0.9 % 100 mL IVPB        1 g 200 mL/hr over 30 Minutes Intravenous Daily at bedtime 11/16/24 0102          MEDICATIONS: Scheduled Meds:  aspirin  EC  81 mg Oral Daily   collagenase    Topical Daily   ezetimibe  10 mg Oral Daily   ferrous sulfate   325 mg Oral Q breakfast   heparin  injection (subcutaneous)  5,000 Units Subcutaneous Q8H   hydrocerin   Topical Daily   insulin  aspart  0-15 Units Subcutaneous TID WC   insulin  aspart  0-5 Units Subcutaneous QHS   insulin  aspart  4 Units Subcutaneous TID WC   insulin  glargine-yfgn  28 Units Subcutaneous Daily   metoprolol  tartrate  25 mg Oral Daily   rosuvastatin  20 mg Oral Daily   Continuous Infusions:  cefTRIAXone (ROCEPHIN)  IV Stopped (11/16/24 2215)   PRN Meds:.acetaminophen  **OR** acetaminophen , melatonin, nicotine, nicotine polacrilex, ondansetron  (ZOFRAN ) IV   I have personally reviewed following labs and imaging studies  LABORATORY DATA: CBC: Recent Labs  Lab 11/15/24 2036 11/16/24 0511  WBC 7.1 7.2  NEUTROABS 5.7 4.9  HGB 8.6* 7.4*  HCT 28.1* 23.8*  MCV 93.7 93.3  PLT 230 198    Basic Metabolic Panel: Recent Labs  Lab 11/16/24 0100 11/16/24 0511 11/16/24 0947 11/16/24 1252 11/16/24 1909  NA 134*  132* 135 136 131*  K 3.4* 3.2* 3.2* 3.6 4.3  CL 90* 90* 95* 94* 91*  CO2 29 29 29 30 25   GLUCOSE 208* 260* 303* 182* 355*  BUN 47* 45* 39* 41* 43*  CREATININE 3.32* 3.26* 3.02* 3.16* 3.42*  CALCIUM  9.3 9.1 8.4* 8.7* 8.9  MG 2.0 1.9  --   --   --   PHOS  --  3.7  --   --   --     GFR: Estimated Creatinine Clearance: 24 mL/min (A) (by C-G formula based on SCr of 3.42 mg/dL (H)).  Liver Function Tests: Recent Labs  Lab 11/16/24 0511  AST 19  ALT 14  ALKPHOS 44  BILITOT 0.5  PROT 5.5*  ALBUMIN  2.5*   No results for input(s): LIPASE, AMYLASE in the last 168 hours. No results for input(s): AMMONIA in the last 168 hours.  Coagulation Profile: Recent Labs  Lab 11/16/24 0511  INR 1.2    Cardiac Enzymes: Recent Labs  Lab 11/16/24 0100  CKTOTAL 92    BNP (last 3 results) No results for input(s): PROBNP in the last 8760 hours.  Lipid Profile: No results for input(s): CHOL, HDL, LDLCALC, TRIG, CHOLHDL, LDLDIRECT in the last 72 hours.  Thyroid Function Tests: Recent Labs    11/15/24 2036  TSH 1.418    Anemia Panel: Recent Labs    11/16/24 0100 11/16/24 0511  VITAMINB12  --  401  FERRITIN 13*  --   TIBC 281  --   IRON 17*  --     Urine analysis:    Component Value Date/Time   COLORURINE STRAW (A) 11/15/2024 2000   APPEARANCEUR CLEAR 11/15/2024 2000   LABSPEC 1.014 11/15/2024 2000   PHURINE 7.0 11/15/2024 2000   GLUCOSEU >=500 (A) 11/15/2024 2000   HGBUR SMALL (A) 11/15/2024 2000   BILIRUBINUR NEGATIVE 11/15/2024 2000   KETONESUR NEGATIVE 11/15/2024 2000   PROTEINUR 100 (A) 11/15/2024 2000   UROBILINOGEN 1.0 11/01/2014 2010   NITRITE NEGATIVE 11/15/2024 2000   LEUKOCYTESUR NEGATIVE 11/15/2024 2000    Sepsis Labs: Lactic Acid, Venous No results found for: LATICACIDVEN  MICROBIOLOGY: No results found for this or any previous visit (from the past 240 hours).  RADIOLOGY STUDIES/RESULTS: DG Foot 2 Views Right Result Date:  11/16/2024 EXAM: 1 OR 2 VIEW(S) XRAY OF THE RIGHT FOOT 11/16/2024 01:43:56 AM COMPARISON: None available. CLINICAL HISTORY: Cellulitis of right lower extremity. FINDINGS: BONES AND JOINTS: No acute fracture. Tiny plantar calcaneal spur. No joint dislocation. SOFT TISSUES: Mild diffuse soft tissue edema. Vascular calcifications. IMPRESSION: 1. Mild diffuse soft tissue edema, possibly related to cellulitis. Electronically signed by: Morgane Naveau MD 11/16/2024 02:00 AM EST RP Workstation: [id number]   DG Chest Port 1 View Result Date: 11/16/2024 EXAM: 1 VIEW(S) XRAY OF THE CHEST 11/16/2024 01:43:56 AM COMPARISON: CXR 07/26/2017, CT Chest 08/26/2024. CLINICAL HISTORY: Hyperglycemia FINDINGS: LUNGS AND PLEURA: Low lung volumes. No focal pulmonary opacity. No pleural effusion. No pneumothorax. HEART AND MEDIASTINUM: Prominent cardiac silhouette with some component likely due to AP portable technique. Otherwise unremarkable cardiomediastinal silhouette. Atherosclerotic plaque of the aorta and coronary arteries. BONES AND SOFT TISSUES: No acute osseous abnormality. IMPRESSION: 1. No acute findings. Electronically signed by: Morgane Naveau MD 11/16/2024 01:54 AM EST RP Workstation: [id number]   DG Tibia/Fibula Right Result Date: 11/16/2024 EXAM: _VIEWS_ VIEW(S) XRAY OF THE RIGHT TIBIA AND FIBULA 11/16/2024 01:43:56 AM COMPARISON: None available. CLINICAL HISTORY: Cellulitis of right lower extremity FINDINGS: BONES AND JOINTS: No acute fracture. No focal osseous lesion. No joint dislocation. SOFT TISSUES: Diffuse soft tissue edema. Vascular calcifications. IMPRESSION: 1. Diffuse soft tissue edema, suggestive of cellulitis. Electronically signed by: Morgane Naveau MD 11/16/2024 01:52 AM EST RP Workstation: [id number]     LOS: 1 day   Donalda Applebaum, MD  [facility]    To contact the attending provider between 7A-7P or the covering provider during after hours 7P-7A, please log into the web site  [email or web address] and access using universal [place] password for that web site. If you do not have the password, please call the hospital operator.  11/17/2024, 9:47 AM

## 2024-11-17 NOTE — TOC Initial Note (Addendum)
 Transition of Care [facility]) - Initial/Assessment Note    Patient Details  Name: Erik Martin MRN: [id number] Date of Birth: 03/08/1951  [facility]) CM/SW Contact:    Bernardino Dean, LCSWA Phone Number: 11/17/2024, 9:23 AM  Clinical Narrative:                 Patient presents from home alone with elevated blood sugars and pressure wounds. Hx of uncontrolled DM2 and AKA. Per chart, spouse supported patient significantly at home but they passed away two weeks ago and patient has had difficulty meeting ADL's since. Noted in chart some concern family. PT/OT recs of SNF noted. SNF work up started. PASRR completed, FL2 completed. TOC following for likely SNF discharge.   1550 - Reviewed SNF list with patient at bedside and daughter Delon via phone.  After discussion, both agreeable to pursue SNF. Provided SNF list of accepting facilities to [facility] via email ([email or web address]) and reviewed at bedside with patient and stepdaughter Ronal Caldron. Patient and family have both tentatively accepted [facility] as preferred SNF. Auth submitted via [employer] and approved 11/17 - 11/24, AuthID #[id number]. Anticipate SNF discharge tmrw 11/20. Patient/family aware.    Expected Discharge Plan: Skilled Nursing Facility Barriers to Discharge: No SNF bed, Insurance Authorization, Continued Medical Work up   Patient Goals and CMS Choice   CMS Medicare.gov Compare Post Acute Care list provided to:: Patient Choice offered to / list presented to : Patient [place] ownership interest in [facility].provided to:: Patient    Expected Discharge Plan and Services In-house Referral: Clinical Social Work   Post Acute Care Choice: Skilled Nursing Facility Living arrangements for the past 2 months: Single Family Home                                      Prior Living Arrangements/Services Living arrangements for the past 2 months: Single Family Home Lives with:: Self  (Spouse passed away recently) Patient language and need for interpreter reviewed:: Yes Do you feel safe going back to the place where you live?: Yes            Criminal Activity/Legal Involvement Pertinent to Current Situation/Hospitalization: No - Comment as needed  Activities of Daily Living      Permission Sought/Granted                  Emotional Assessment Appearance:: Appears stated age   Affect (typically observed): Appropriate Orientation: : Oriented to Self, Oriented to Place, Oriented to  Time, Oriented to Situation Alcohol / Substance Use: Not Applicable Psych Involvement: No (comment)  Admission diagnosis:  Bereavement [Z63.4] Hyperglycemia [R73.9] Poorly controlled diabetes mellitus (HCC) [E11.65] Hyperosmolar hyperglycemic state (HHS) (HCC) [E11.00] Patient Active Problem List   Diagnosis Date Noted   Hyperosmolar hyperglycemic state (HHS) (HCC) 11/16/2024   Hypokalemia 11/16/2024   Generalized weakness 11/16/2024   Dehydration 11/16/2024   History of essential hypertension 11/16/2024   CKD (chronic kidney disease), stage IV (HCC) 11/16/2024   Tobacco abuse 11/16/2024   Anemia due to chronic kidney disease 11/16/2024   Cellulitis of right lower extremity 11/16/2024   Hyperglycemia 11/15/2024   History of total knee arthroplasty, 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   Femoral artery occlusion, 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   Anemia 11/02/2014   Hypoglycemia 11/01/2014   Acute renal failure 11/01/2014   DM2 (diabetes mellitus, type 2) (HCC) 11/01/2014   Anemia, blood loss 11/01/2014  Hypertension 11/01/2014   HLD (hyperlipidemia) 11/01/2014   Lumbar spinal stenosis 10/27/2014   PCP:  Lizette Macario BRAVO, NP (Inactive) Pharmacy:   [facility] [id number] - [address], [place] - [address] AT [facility] [address] [place] [address] Phone: [phone] Fax: [phone]     Social Drivers of Health (SDOH) Social History: SDOH Screenings   Tobacco Use: High Risk  (11/15/2024)   SDOH Interventions:     Readmission Risk Interventions     No data to display

## 2024-11-17 NOTE — Plan of Care (Signed)
  Problem: Coping: Goal: Ability to adjust to condition or change in health will improve Outcome: Progressing   Problem: Metabolic: Goal: Ability to maintain appropriate glucose levels will improve Outcome: Progressing   Problem: Skin Integrity: Goal: Risk for impaired skin integrity will decrease Outcome: Progressing   Problem: Clinical Measurements: Goal: Will remain free from infection Outcome: Progressing   Problem: Activity: Goal: Risk for activity intolerance will decrease Outcome: Progressing   Problem: Skin Integrity: Goal: Risk for impaired skin integrity will decrease Outcome: Progressing

## 2024-11-17 NOTE — Plan of Care (Signed)
  Problem: Health Behavior/Discharge Planning: Goal: Ability to identify and utilize available resources and services will improve Outcome: Progressing Goal: Ability to manage health-related needs will improve Outcome: Progressing   Problem: Metabolic: Goal: Ability to maintain appropriate glucose levels will improve Outcome: Progressing   Problem: Nutritional: Goal: Maintenance of adequate nutrition will improve Outcome: Progressing Goal: Progress toward achieving an optimal weight will improve Outcome: Progressing

## 2024-11-17 NOTE — [place] FL2 (Signed)
 [place]  MEDICAID FL2 LEVEL OF CARE FORM     IDENTIFICATION  Patient Name: Erik Martin Birthdate: 1951-05-14 Sex: male Admission Date (Current Location): 11/15/2024  [facility] and [place] Number:  [employer] and Address:  [facility] [place], [address] [place], [place] [address]      Provider Number: [id number]  Attending Physician Name and Address:  Raenelle Donalda CHRISTELLA, MD  Relative Name and Phone Number:  MARLOWE SHUCK (Daughter) [phone]; LEOPOLDO POSTIN Daughter) [phone]    Current Level of Care: Hospital Recommended Level of Care: Skilled Nursing Facility Prior Approval Number:    Date Approved/Denied:   PASRR Number: [id number]  Discharge Plan: SNF    Current Diagnoses: Patient Active Problem List   Diagnosis Date Noted   Hyperosmolar hyperglycemic state (HHS) (HCC) 11/16/2024   Hypokalemia 11/16/2024   Generalized weakness 11/16/2024   Dehydration 11/16/2024   History of essential hypertension 11/16/2024   CKD (chronic kidney disease), stage IV (HCC) 11/16/2024   Tobacco abuse 11/16/2024   Anemia due to chronic kidney disease 11/16/2024   Cellulitis of right lower extremity 11/16/2024   Hyperglycemia 11/15/2024   History of total knee arthroplasty, 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   Femoral artery occlusion, 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   Anemia 11/02/2014   Hypoglycemia 11/01/2014   Acute renal failure 11/01/2014   DM2 (diabetes mellitus, type 2) (HCC) 11/01/2014   Anemia, blood loss 11/01/2014   Hypertension 11/01/2014   HLD (hyperlipidemia) 11/01/2014   Lumbar spinal stenosis 10/27/2014    Orientation RESPIRATION BLADDER Height & Weight     Self, Time, Situation, Place  O2 (2L o2 [place]) Continent Weight: 253 lb 1.4 oz (114.8 kg) Height:  5' 9 (175.3 cm)  BEHAVIORAL SYMPTOMS/MOOD NEUROLOGICAL BOWEL NUTRITION STATUS      Continent Diet (See D/C Summary)  AMBULATORY STATUS COMMUNICATION OF NEEDS Skin   Extensive Assist Verbally Other  (Comment), PU Stage and Appropriate Care (L elbow wound, head wound, pretibial wound, PU to buttocks (unstageable) and heel (stage 2),)                       Personal Care Assistance Level of Assistance  Bathing, Feeding, Dressing Bathing Assistance: Maximum assistance Feeding assistance: Independent Dressing Assistance: Maximum assistance     Functional Limitations Info  Hearing   Hearing Info: Impaired      SPECIAL CARE FACTORS FREQUENCY  PT (By licensed PT), OT (By licensed OT)     PT Frequency: 5x/week OT Frequency: 5x/week            Contractures Contractures Info: Not present    Additional Factors Info  Allergies, Code Status Code Status Info: Full Allergies Info: Nsaids, Tolmetin, Niaspan           Current Medications (11/17/2024):  This is the current hospital active medication list Current Facility-Administered Medications  Medication Dose Route Frequency Provider Last Rate Last Admin   acetaminophen  (TYLENOL ) tablet 650 mg  650 mg Oral Q6H PRN Howerter, Justin B, DO   650 mg at 11/16/24 0109   Or   acetaminophen  (TYLENOL ) suppository 650 mg  650 mg Rectal Q6H PRN Howerter, Justin B, DO       aspirin  EC tablet 81 mg  81 mg Oral Daily Pahwani, Ravi, MD   81 mg at 11/17/24 0834   cefTRIAXone  (ROCEPHIN ) 1 g in sodium chloride  0.9 % 100 mL IVPB  1 g Intravenous QHS Howerter, Justin B, DO   Stopped at 11/16/24 2215  collagenase  (SANTYL ) ointment   Topical Daily Vernon Ranks, MD   Given at 11/17/24 0835   ezetimibe (ZETIA) tablet 10 mg  10 mg Oral Daily Ghimire, Shanker M, MD       ferrous sulfate  tablet 325 mg  325 mg Oral Q breakfast Howerter, Justin B, DO   325 mg at 11/17/24 9165   heparin  injection 5,000 Units  5,000 Units Subcutaneous Q8H Ghimire, Donalda HERO, MD       hydrocerin (EUCERIN) cream   Topical Daily Vernon Ranks, MD   Given at 11/17/24 9164   insulin  aspart (novoLOG ) injection 0-15 Units  0-15 Units Subcutaneous TID WC Pahwani, Ravi, MD    15 Units at 11/17/24 9166   insulin  aspart (novoLOG ) injection 0-5 Units  0-5 Units Subcutaneous QHS Pahwani, Ravi, MD   5 Units at 11/16/24 2129   insulin  aspart (novoLOG ) injection 4 Units  4 Units Subcutaneous TID WC Raenelle Donalda HERO, MD   4 Units at 11/17/24 9165   insulin  glargine-yfgn (SEMGLEE) injection 28 Units  28 Units Subcutaneous Daily Ghimire, Donalda HERO, MD       melatonin tablet 3 mg  3 mg Oral QHS PRN Howerter, Justin B, DO   3 mg at 11/16/24 0109   metoprolol  tartrate (LOPRESSOR ) tablet 25 mg  25 mg Oral Daily Howerter, Justin B, DO   25 mg at 11/17/24 9165   nicotine (NICODERM CQ - dosed in mg/24 hours) patch 21 mg  21 mg Transdermal Daily PRN Howerter, Justin B, DO       nicotine polacrilex (NICORETTE) gum 2 mg  2 mg Oral PRN Howerter, Justin B, DO       ondansetron  (ZOFRAN ) injection 4 mg  4 mg Intravenous Q6H PRN Howerter, Justin B, DO       rosuvastatin (CRESTOR) tablet 20 mg  20 mg Oral Daily Howerter, Justin B, DO   20 mg at 11/17/24 9165     Discharge Medications: Please see discharge summary for a list of discharge medications.  Relevant Imaging Results:  Relevant Lab Results:   Additional Information    [facility], LCSWA

## 2024-11-18 DIAGNOSIS — E1100 Type 2 diabetes mellitus with hyperosmolarity without nonketotic hyperglycemic-hyperosmolar coma (NKHHC): Secondary | ICD-10-CM | POA: Diagnosis not present

## 2024-11-18 DIAGNOSIS — N184 Chronic kidney disease, stage 4 (severe): Secondary | ICD-10-CM | POA: Diagnosis not present

## 2024-11-18 DIAGNOSIS — D631 Anemia in chronic kidney disease: Secondary | ICD-10-CM | POA: Diagnosis not present

## 2024-11-18 DIAGNOSIS — L03115 Cellulitis of right lower limb: Secondary | ICD-10-CM | POA: Diagnosis not present

## 2024-11-18 LAB — CBC
HCT: 27.8 % — ABNORMAL LOW (ref 39.0–52.0)
Hemoglobin: 8.5 g/dL — ABNORMAL LOW (ref 13.0–17.0)
MCH: 29 pg (ref 26.0–34.0)
MCHC: 30.6 g/dL (ref 30.0–36.0)
MCV: 94.9 fL (ref 80.0–100.0)
Platelets: 214 K/uL (ref 150–400)
RBC: 2.93 MIL/uL — ABNORMAL LOW (ref 4.22–5.81)
RDW: 14.9 % (ref 11.5–15.5)
WBC: 7.8 K/uL (ref 4.0–10.5)
nRBC: 0 % (ref 0.0–0.2)

## 2024-11-18 LAB — BASIC METABOLIC PANEL WITH GFR
Anion gap: 14 (ref 5–15)
BUN: 33 mg/dL — ABNORMAL HIGH (ref 8–23)
CO2: 24 mmol/L (ref 22–32)
Calcium: 8.8 mg/dL — ABNORMAL LOW (ref 8.9–10.3)
Chloride: 98 mmol/L (ref 98–111)
Creatinine, Ser: 3.11 mg/dL — ABNORMAL HIGH (ref 0.61–1.24)
GFR, Estimated: 20 mL/min — ABNORMAL LOW (ref >60)
Glucose, Bld: 181 mg/dL — ABNORMAL HIGH (ref 70–99)
Potassium: 3.9 mmol/L (ref 3.5–5.1)
Sodium: 136 mmol/L (ref 135–145)

## 2024-11-18 LAB — GLUCOSE, CAPILLARY
Glucose-Capillary: 184 mg/dL — ABNORMAL HIGH (ref 70–99)
Glucose-Capillary: 249 mg/dL — ABNORMAL HIGH (ref 70–99)

## 2024-11-18 MED ORDER — SIMETHICONE 80 MG PO CHEW: 160.0000 mg | CHEWABLE_TABLET | Freq: Four times a day (QID) | ORAL | Status: AC | PRN

## 2024-11-18 MED ORDER — METOLAZONE 2.5 MG PO TABS: 2.5000 mg | ORAL_TABLET | ORAL | Status: AC

## 2024-11-18 MED ORDER — POLYETHYLENE GLYCOL 3350 17 G PO PACK: 17.0000 g | PACK | Freq: Every day | ORAL | Status: AC

## 2024-11-18 MED ORDER — FERROUS SULFATE 325 (65 FE) MG PO TABS: 325.0000 mg | ORAL_TABLET | Freq: Two times a day (BID) | ORAL | Status: AC

## 2024-11-18 MED ORDER — CEPHALEXIN 500 MG PO CAPS: 500.0000 mg | ORAL_CAPSULE | Freq: Two times a day (BID) | ORAL | Status: AC

## 2024-11-18 MED ORDER — COLLAGENASE 250 UNIT/GM EX OINT: TOPICAL_OINTMENT | Freq: Every day | CUTANEOUS | Status: AC

## 2024-11-18 MED ORDER — INSULIN GLARGINE-YFGN 100 UNIT/ML ~~LOC~~ SOLN: 32.0000 [IU] | Freq: Every day | SUBCUTANEOUS | Status: AC

## 2024-11-18 MED ORDER — LINAGLIPTIN 5 MG PO TABS: 5.0000 mg | ORAL_TABLET | Freq: Every day | ORAL | Status: AC

## 2024-11-18 MED ORDER — INSULIN ASPART 100 UNIT/ML IJ SOLN: INTRAMUSCULAR | Status: AC

## 2024-11-18 MED ORDER — PANTOPRAZOLE SODIUM 40 MG PO TBEC: 40.0000 mg | DELAYED_RELEASE_TABLET | Freq: Every day | ORAL | Status: AC

## 2024-11-18 MED ORDER — SODIUM CHLORIDE 0.9 % IV SOLN
300.0000 mg | Freq: Once | INTRAVENOUS | Status: AC
  Administered 2024-11-18: 300 mg via INTRAVENOUS
  Filled 2024-11-18: qty 15

## 2024-11-18 MED ORDER — INSULIN GLARGINE-YFGN 100 UNIT/ML ~~LOC~~ SOLN
32.0000 [IU] | Freq: Every day | SUBCUTANEOUS | Status: DC
  Administered 2024-11-18: 32 [IU] via SUBCUTANEOUS
  Filled 2024-11-18: qty 0.32

## 2024-11-18 MED ORDER — ROSUVASTATIN CALCIUM 20 MG PO TABS: 20.0000 mg | ORAL_TABLET | Freq: Every day | ORAL | Status: AC

## 2024-11-18 NOTE — TOC Initial Note (Signed)
 Transition of Care [facility]) - Initial/Assessment Note    Patient Details  Name: Erik Martin MRN: [id number] Date of Birth: February 14, 1951  [facility]) CM/SW Contact:    Inocente GORMAN Kindle, LCSW Phone Number: 11/18/2024, 11:44 AM  Clinical Narrative:                 [facility] is ready for patient. Daughter in agreement with [facility] for transport.   Per RN, awaiting IV infusion.  Expected Discharge Plan: Skilled Nursing Facility Barriers to Discharge: Barriers Resolved   Patient Goals and CMS Choice Patient states their goals for this hospitalization and ongoing recovery are:: Rehab CMS Medicare.gov Compare Post Acute Care list provided to:: Patient Choice offered to / list presented to : Patient, Adult Children [place] ownership interest in [facility].provided to:: Patient    Expected Discharge Plan and Services In-house Referral: Clinical Social Work   Post Acute Care Choice: Skilled Nursing Facility Living arrangements for the past 2 months: Single Family Home Expected Discharge Date: 11/18/24                                    Prior Living Arrangements/Services Living arrangements for the past 2 months: Single Family Home Lives with:: Self (Spouse passed away recently) Patient language and need for interpreter reviewed:: Yes Do you feel safe going back to the place where you live?: Yes      Need for Family Participation in Patient Care: Yes (Comment) Care giver support system in place?: Yes (comment)   Criminal Activity/Legal Involvement Pertinent to Current Situation/Hospitalization: No - Comment as needed  Activities of Daily Living      Permission Sought/Granted Permission sought to share information with : Facility Medical Sales Representative, Family Supports Permission granted to share information with : Yes, Verbal Permission Granted  Share Information with NAME: MARLOWE SHUCK- Daughter   [phone]  Permission granted to share info w  AGENCY: SNFs        Emotional Assessment Appearance:: Appears stated age Attitude/Demeanor/Rapport: Engaged Affect (typically observed): Appropriate Orientation: : Oriented to Self, Oriented to Place, Oriented to  Time, Oriented to Situation Alcohol / Substance Use: Not Applicable Psych Involvement: No (comment)  Admission diagnosis:  Bereavement [Z63.4] Hyperglycemia [R73.9] Poorly controlled diabetes mellitus (HCC) [E11.65] Hyperosmolar hyperglycemic state (HHS) (HCC) [E11.00] Patient Active Problem List   Diagnosis Date Noted   Hyperosmolar hyperglycemic state (HHS) (HCC) 11/16/2024   Hypokalemia 11/16/2024   Generalized weakness 11/16/2024   Dehydration 11/16/2024   History of essential hypertension 11/16/2024   CKD (chronic kidney disease), stage IV (HCC) 11/16/2024   Tobacco abuse 11/16/2024   Anemia due to chronic kidney disease 11/16/2024   Cellulitis of right lower extremity 11/16/2024   Hyperglycemia 11/15/2024   History of total knee arthroplasty, 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   Femoral artery occlusion, 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   Anemia 11/02/2014   Hypoglycemia 11/01/2014   Acute renal failure 11/01/2014   DM2 (diabetes mellitus, type 2) (HCC) 11/01/2014   Anemia, blood loss 11/01/2014   Hypertension 11/01/2014   HLD (hyperlipidemia) 11/01/2014   Lumbar spinal stenosis 10/27/2014   PCP:  Lizette Macario BRAVO, NP (Inactive) Pharmacy:   [id number] [address] [place], [place] - [address] AT [facility] [address] [place] [address] Phone: [phone] Fax: [phone]     Social Drivers of Health (SDOH) Social History: SDOH Screenings   Tobacco Use: High Risk (11/15/2024)  SDOH Interventions:     Readmission Risk Interventions     No data to display

## 2024-11-18 NOTE — Plan of Care (Signed)

## 2024-11-18 NOTE — Plan of Care (Signed)

## 2024-11-18 NOTE — TOC Transition Note (Addendum)
 Transition of Care [facility]) - Discharge Note   Patient Details  Name: Erik Martin MRN: [id number] Date of Birth: Jun 05, 1951  [facility]) CM/SW Contact:  Bernardino Dean, LCSWA Phone Number: 11/18/2024, 11:47 AM   Clinical Narrative:    Patient will DC to: [facility] Anticipated DC date: 11/20 Family notified: Y Transport by: [facility]   Per MD patient ready for DC to [facility]. RN to call report prior to discharge [phone], room 209). RN, patient, patient's family, and facility notified of DC. Discharge Summary and FL2 sent to facility. DC packet and CMN on chart. Ambulance transport requested for patient.   CSW will sign off for now as social work intervention is no longer needed. Please consult us  again if new needs arise.     Final next level of care: Skilled Nursing Facility Barriers to Discharge: Barriers Resolved   Patient Goals and CMS Choice Patient states their goals for this hospitalization and ongoing recovery are:: Rehab CMS Medicare.gov Compare Post Acute Care list provided to:: Patient Choice offered to / list presented to : Patient, Adult Children [place] ownership interest in [facility].provided to:: Patient    Discharge Placement                       Discharge Plan and Services Additional resources added to the After Visit Summary for   In-house Referral: Clinical Social Work   Post Acute Care Choice: Skilled Nursing Facility                               Social Drivers of Health (SDOH) Interventions SDOH Screenings   Tobacco Use: High Risk (11/15/2024)     Readmission Risk Interventions     No data to display

## 2024-11-18 NOTE — Discharge Summary (Signed)
 PATIENT DETAILS Name: Erik Martin Age: 73 y.o. Sex: male Date of Birth: [birth year] MRN: [id number]. Admitting Physician: Eva KATHEE Pore, DO PCP:Lam, Macario BRAVO, NP (Inactive)  Admit Date: 11/15/2024 Discharge date: 11/18/2024  Recommendations for Outpatient Follow-up:  Follow up with PCP in 1-2 weeks Please obtain CMP/CBC in one week  Admitted From:  Home  Disposition: Skilled nursing facility   Discharge Condition: fair  CODE STATUS:   Code Status: Full Code   Diet recommendation:  Diet Order             Diet - low sodium heart healthy           Diet regular Room service appropriate? Yes; Fluid consistency: Thin  Diet effective now                    Brief Summary: Patient is a 73 y.o.  male with history of DM-2, HTN, HLD, CKD stage IV, left AKA (2021)-presented with generalized weakness-elevated blood sugars-found to have hyperglycemic hyperosmolar nonketotic state-started on insulin  infusion and admitted to the hospitalist service.   Significant events: 11/17>> admit to [facility]   Significant studies: 11/18>> x-ray right tibia/fibula: Diffuse soft tissue edema-suggestive of cellulitis 11/18>> x-ray right foot: Mild diffuse soft tissue edema 11/18>> chest x-ray: No acute findings   Significant microbiology data: None   Procedures: None   Consults: None  Brief Hospital Course: Hyperglycemic hyperosmolar nonketotic state Likely secondary to poor compliance to insulin  (spouse passed away 2-3 weeks back-she was his primary caregiver) Managed with IV fluids/IV insulin -CBGs better   DM-2 with uncontrolled hyperglycemia (A1c 14.7 on 11/14) See above CBGs better controlled overnight I will increase Semglee to 30 units, continue SSI on discharge Follow-up with PCP/attending MD at SNF for further optimization  R leg cellulitis Mild erythema but has improved-suspect has some chronic changes at baseline Managed briefly with vancomycin  and then  Rocephin-Will be transition to Keflex on discharge.   CKD 4 Creatinine close to baseline Avoid nephrotoxic agents Follow electrolytes periodically. Ensure outpatient follow-up with nephrology   Normocytic anemia Secondary to CKD 4-and iron deficiency-no evidence of blood loss IV iron x 2 doses during this hospitalization Continue oral iron supplementation Follow CBC periodically   HTN BP stable Continue metoprolol .   HLD Statin   PAD-s/p left AKA (2021) Stump appears benign Aspirin /statin.   Bereavement Spouse passed awat-2-3 weeks before this hospitalization-sounds like she was his primary caregiver-gave/managed medications   Debility/deconditioning Worse-secondary to acute illness PT/OT eval-SNF planned.  Pressure Ulcer: Agree with assessment and plan as outlined below  Wound 11/16/24 Pressure Injury Buttocks Right;Lower Unstageable - Full thickness tissue loss in which the base of the injury is covered by slough (yellow, tan, gray, green or brown) and/or eschar (tan, brown or black) in the wound bed. (Active)     Wound 11/16/24 Pressure Injury Heel Right Stage 2 -  Partial thickness loss of dermis presenting as a shallow open injury with a red, pink wound bed without slough. (Active)     Wound 11/17/24 1544 Pressure Injury Buttocks Left Stage 2 -  Partial thickness loss of dermis presenting as a shallow open injury with a red, pink wound bed without slough. (Active)   Class 2 obesity: Estimated body mass index is 37.37 kg/m as calculated from the following:   Height as of this encounter: 5' 9 (1.753 m).   Weight as of this encounter: 114.8 kg.    Discharge Diagnoses:  Principal Problem:   Hyperosmolar hyperglycemic  state (HHS) (HCC) Active Problems:   DM2 (diabetes mellitus, type 2) (HCC)   HLD (hyperlipidemia)   Hyperglycemia   Hypokalemia   Generalized weakness   Dehydration   History of essential hypertension   CKD (chronic kidney disease), stage IV  (HCC)   Tobacco abuse   Anemia due to chronic kidney disease   Cellulitis of right lower extremity   Discharge Instructions:  Activity:  As tolerated with Full fall precautions use walker/cane & assistance as needed   Discharge Instructions     (HEART FAILURE PATIENTS) Call MD:  Anytime you have any of the following symptoms: 1) 3 pound weight gain in 24 hours or 5 pounds in 1 week 2) shortness of breath, with or without a dry hacking cough 3) swelling in the hands, feet or stomach 4) if you have to sleep on extra pillows at night in order to breathe.   Complete by: As directed    Call MD for:  persistant nausea and vomiting   Complete by: As directed    Diet - low sodium heart healthy   Complete by: As directed    Discharge instructions   Complete by: As directed    Follow with Primary MD  Lizette Macario BRAVO, NP (Inactive) in 1-2 weeks  Please get a complete blood count and chemistry panel checked by your Primary MD at your next visit, and again as instructed by your Primary MD.  Get Medicines reviewed and adjusted: Please take all your medications with you for your next visit with your Primary MD  Laboratory/radiological data: Please request your Primary MD to go over all hospital tests and procedure/radiological results at the follow up, please ask your Primary MD to get all Hospital records sent to his/her office.  In some cases, they will be blood work, cultures and biopsy results pending at the time of your discharge. Please request that your primary care M.D. follows up on these results.  Also Note the following: If you experience worsening of your admission symptoms, develop shortness of breath, life threatening emergency, suicidal or homicidal thoughts you must seek medical attention immediately by calling 911 or calling your MD immediately  if symptoms less severe.  You must read complete instructions/literature along with all the possible adverse reactions/side effects for  all the Medicines you take and that have been prescribed to you. Take any new Medicines after you have completely understood and accpet all the possible adverse reactions/side effects.   Do not drive when taking Pain medications or sleeping medications (Benzodaizepines)  Do not take more than prescribed Pain, Sleep and Anxiety Medications. It is not advisable to combine anxiety,sleep and pain medications without talking with your primary care practitioner  Special Instructions: If you have smoked or chewed Tobacco  in the last 2 yrs please stop smoking, stop any regular Alcohol  and or any Recreational drug use.  Wear Seat belts while driving.  Please note: You were cared for by a hospitalist during your hospital stay. Once you are discharged, your primary care physician will handle any further medical issues. Please note that NO REFILLS for any discharge medications will be authorized once you are discharged, as it is imperative that you return to your primary care physician (or establish a relationship with a primary care physician if you do not have one) for your post hospital discharge needs so that they can reassess your need for medications and monitor your lab values.   Please check CBGs before meals and at  bedtime.   Discharge wound care:   Complete by: As directed    Wound care  Daily      Comments:  1. Apply Santyl  to right ischium/lower buttock wound Q day, then cover with moist gauze and foam dressing.  Change foam dressing Q 3 days or PRN soiling 2. Eucerin to right leg Q day  2. Foam dressing to left elbow, right heel Q day, change Q 3 days or PRN soiling   Increase activity slowly   Complete by: As directed       Allergies as of 11/18/2024       Reactions   Nsaids Other (See Comments)   H/O renal failure.   Tolmetin    Other reaction(s): Other (See Comments) H/O renal failure.   Niaspan [niacin] Other (See Comments)   Flushing, burning        Medication List      STOP taking these medications    nystatin powder Commonly known as: MYCOSTATIN/NYSTOP   Oxycodone  HCl 10 MG Tabs   pioglitazone  30 MG tablet Commonly known as: ACTOS    Potassium Chloride  ER 20 MEQ Tbcr   Trulicity 3 MG/0.5ML Soaj Generic drug: Dulaglutide   VITAMIN D PO       TAKE these medications    aspirin  EC 81 MG tablet Take 81 mg by mouth.   cephALEXin 500 MG capsule Commonly known as: KEFLEX Take 1 capsule (500 mg total) by mouth 2 (two) times daily for 4 days.   collagenase  250 UNIT/GM ointment Commonly known as: SANTYL  Apply topically daily. Apply Santyl  to right ischium/lower buttock wound Q day, then cover with moist gauze and foam dressing.  Change foam dressing Q 3 days or PRN soiling   COQ10 PO Take 1 tablet by mouth daily.   ezetimibe 10 MG tablet Commonly known as: ZETIA Take 10 mg by mouth daily.   ferrous sulfate  325 (65 FE) MG tablet Take 1 tablet (325 mg total) by mouth 2 (two) times daily with a meal. What changed: when to take this   FLAXSEED OIL PO Take 1 tablet by mouth daily.   fluticasone 50 MCG/ACT nasal spray Commonly known as: FLONASE Place 2 sprays into both nostrils daily.   insulin  aspart 100 UNIT/ML injection Commonly known as: novoLOG  0-15 Units, Subcutaneous, 3 times daily with meals CBG < 70: Implement Hypoglycemia protocol CBG 70 - 120: 0 units CBG 121 - 150: 2 units CBG 151 - 200: 3 units CBG 201 - 250: 5 units CBG 251 - 300: 8 units CBG 301 - 350: 11 units CBG 351 - 400: 15 units CBG > 400: call MD   insulin  glargine-yfgn 100 UNIT/ML injection Commonly known as: SEMGLEE Inject 0.32 mLs (32 Units total) into the skin daily.   linagliptin 5 MG Tabs tablet Commonly known as: Tradjenta Take 1 tablet (5 mg total) by mouth daily.   metolazone 2.5 MG tablet Commonly known as: ZAROXOLYN Take 1 tablet (2.5 mg total) by mouth once a week. What changed: when to take this   metoprolol  tartrate 25 MG tablet Commonly  known as: LOPRESSOR  Take 25 mg by mouth daily. Taking 1/2 tablet, 12.5 mg daily   pantoprazole  40 MG tablet Commonly known as: PROTONIX  Take 1 tablet (40 mg total) by mouth daily at 12 noon.   polyethylene glycol 17 g packet Commonly known as: MiraLax  Take 17 g by mouth daily.   rosuvastatin 20 MG tablet Commonly known as: CRESTOR Take 1 tablet (20 mg total)  by mouth daily. What changed: Another medication with the same name was removed. Continue taking this medication, and follow the directions you see here.   simethicone  80 MG chewable tablet Commonly known as: MYLICON Chew 2 tablets (160 mg total) by mouth every 6 (six) hours as needed for flatulence.               Discharge Care Instructions  (From admission, onward)           Start     Ordered   11/18/24 0000  Discharge wound care:       Comments: Wound care  Daily      Comments:  1. Apply Santyl  to right ischium/lower buttock wound Q day, then cover with moist gauze and foam dressing.  Change foam dressing Q 3 days or PRN soiling 2. Eucerin to right leg Q day  2. Foam dressing to left elbow, right heel Q day, change Q 3 days or PRN soiling   11/18/24 9170            Contact information for follow-up providers     Lam, Lynn E, NP. Schedule an appointment as soon as possible for a visit in 1 week(s).   Specialty: Nurse Practitioner Contact information: [address] [place] [address] [phone]              Contact information for after-discharge care     Destination     [facility]   Service: Skilled Nursing Contact information: [address] [place]  [address] [phone]                    Allergies  Allergen Reactions   Nsaids Other (See Comments)    H/O renal failure.   Tolmetin     Other reaction(s): Other (See Comments) H/O renal failure.   Niaspan [Niacin] Other (See Comments)    Flushing, burning     Other  Procedures/Studies: DG Foot 2 Views Right Result Date: 11/16/2024 EXAM: 1 OR 2 VIEW(S) XRAY OF THE RIGHT FOOT 11/16/2024 01:43:56 AM COMPARISON: None available. CLINICAL HISTORY: Cellulitis of right lower extremity. FINDINGS: BONES AND JOINTS: No acute fracture. Tiny plantar calcaneal spur. No joint dislocation. SOFT TISSUES: Mild diffuse soft tissue edema. Vascular calcifications. IMPRESSION: 1. Mild diffuse soft tissue edema, possibly related to cellulitis. Electronically signed by: Morgane Naveau MD 11/16/2024 02:00 AM EST RP Workstation: [id number]   DG Chest Port 1 View Result Date: 11/16/2024 EXAM: 1 VIEW(S) XRAY OF THE CHEST 11/16/2024 01:43:56 AM COMPARISON: CXR 07/26/2017, CT Chest 08/26/2024. CLINICAL HISTORY: Hyperglycemia FINDINGS: LUNGS AND PLEURA: Low lung volumes. No focal pulmonary opacity. No pleural effusion. No pneumothorax. HEART AND MEDIASTINUM: Prominent cardiac silhouette with some component likely due to AP portable technique. Otherwise unremarkable cardiomediastinal silhouette. Atherosclerotic plaque of the aorta and coronary arteries. BONES AND SOFT TISSUES: No acute osseous abnormality. IMPRESSION: 1. No acute findings. Electronically signed by: Morgane Naveau MD 11/16/2024 01:54 AM EST RP Workstation: [id number]   DG Tibia/Fibula Right Result Date: 11/16/2024 EXAM: _VIEWS_ VIEW(S) XRAY OF THE RIGHT TIBIA AND FIBULA 11/16/2024 01:43:56 AM COMPARISON: None available. CLINICAL HISTORY: Cellulitis of right lower extremity FINDINGS: BONES AND JOINTS: No acute fracture. No focal osseous lesion. No joint dislocation. SOFT TISSUES: Diffuse soft tissue edema. Vascular calcifications. IMPRESSION: 1. Diffuse soft tissue edema, suggestive of cellulitis. Electronically signed by: Morgane Naveau MD 11/16/2024 01:52 AM EST RP Workstation: [id number]     TODAY-DAY OF DISCHARGE:  Subjective:  Erik Martin today has no headache,no chest abdominal pain,no new weakness tingling or  numbness, feels much better wants to go home today.   Objective:   Blood pressure 123/67, pulse 81, temperature 98.2 F (36.8 C), temperature source Oral, resp. rate (!) 21, height 5' 9 (1.753 m), weight 113.6 kg, SpO2 98%.  Intake/Output Summary (Last 24 hours) at 11/18/2024 0829 Last data filed at 11/18/2024 0400 Gross per 24 hour  Intake 330.59 ml  Output 350 ml  Net -19.41 ml   Filed Weights   11/16/24 0515 11/17/24 0500 11/18/24 0409  Weight: 112 kg 114.8 kg 113.6 kg    Exam: Awake Alert, Oriented *3, No new F.N deficits, Normal affect Stone.AT,PERRAL Supple Neck,No JVD, No cervical lymphadenopathy appriciated.  Symmetrical Chest wall movement, Good air movement bilaterally, CTAB RRR,No Gallops,Rubs or new Murmurs, No Parasternal Heave +ve B.Sounds, Abd Soft, Non tender, No organomegaly appriciated, No rebound -guarding or rigidity. No Cyanosis, Clubbing or edema, No new Rash or bruise   PERTINENT RADIOLOGIC STUDIES: No results found.   PERTINENT LAB RESULTS: CBC: Recent Labs    11/16/24 0511 11/18/24 0252  WBC 7.2 7.8  HGB 7.4* 8.5*  HCT 23.8* 27.8*  PLT 198 214   CMET CMP     Component Value Date/Time   NA 136 11/18/2024 0252   K 3.9 11/18/2024 0252   CL 98 11/18/2024 0252   CO2 24 11/18/2024 0252   GLUCOSE 181 (H) 11/18/2024 0252   BUN 33 (H) 11/18/2024 0252   CREATININE 3.11 (H) 11/18/2024 0252   CALCIUM  8.8 (L) 11/18/2024 0252   PROT 5.5 (L) 11/16/2024 0511   ALBUMIN  2.5 (L) 11/16/2024 0511   AST 19 11/16/2024 0511   ALT 14 11/16/2024 0511   ALKPHOS 44 11/16/2024 0511   BILITOT 0.5 11/16/2024 0511   GFRNONAA 20 (L) 11/18/2024 0252    GFR Estimated Creatinine Clearance: 26.3 mL/min (A) (by C-G formula based on SCr of 3.11 mg/dL (H)). No results for input(s): LIPASE, AMYLASE in the last 72 hours. Recent Labs    11/16/24 0100  CKTOTAL 92   Invalid input(s): POCBNP No results for input(s): DDIMER in the last 72 hours. Recent Labs     11/15/24 2036  HGBA1C 14.7*   No results for input(s): CHOL, HDL, LDLCALC, TRIG, CHOLHDL, LDLDIRECT in the last 72 hours. Recent Labs    11/15/24 2036  TSH 1.418   Recent Labs    11/16/24 0100 11/16/24 0511  VITAMINB12  --  401  FERRITIN 13*  --   TIBC 281  --   IRON 17*  --    Coags: Recent Labs    11/16/24 0511  INR 1.2   Microbiology: No results found for this or any previous visit (from the past 240 hours).  FURTHER DISCHARGE INSTRUCTIONS:  Get Medicines reviewed and adjusted: Please take all your medications with you for your next visit with your Primary MD  Laboratory/radiological data: Please request your Primary MD to go over all hospital tests and procedure/radiological results at the follow up, please ask your Primary MD to get all Hospital records sent to his/her office.  In some cases, they will be blood work, cultures and biopsy results pending at the time of your discharge. Please request that your primary care M.D. goes through all the records of your hospital data and follows up on these results.  Also Note the following: If you experience worsening of your admission symptoms, develop shortness of breath, life threatening emergency, suicidal or  homicidal thoughts you must seek medical attention immediately by calling 911 or calling your MD immediately  if symptoms less severe.  You must read complete instructions/literature along with all the possible adverse reactions/side effects for all the Medicines you take and that have been prescribed to you. Take any new Medicines after you have completely understood and accpet all the possible adverse reactions/side effects.   Do not drive when taking Pain medications or sleeping medications (Benzodaizepines)  Do not take more than prescribed Pain, Sleep and Anxiety Medications. It is not advisable to combine anxiety,sleep and pain medications without talking with your primary care  practitioner  Special Instructions: If you have smoked or chewed Tobacco  in the last 2 yrs please stop smoking, stop any regular Alcohol  and or any Recreational drug use.  Wear Seat belts while driving.  Please note: You were cared for by a hospitalist during your hospital stay. Once you are discharged, your primary care physician will handle any further medical issues. Please note that NO REFILLS for any discharge medications will be authorized once you are discharged, as it is imperative that you return to your primary care physician (or establish a relationship with a primary care physician if you do not have one) for your post hospital discharge needs so that they can reassess your need for medications and monitor your lab values.  Total Time spent coordinating discharge including counseling, education and face to face time equals greater than 30 minutes.  SignedBETHA Donalda Applebaum 11/18/2024 8:29 AM
